# Patient Record
Sex: Male | Born: 1970 | Race: White | Hispanic: No | Marital: Single | Smoking: Current every day smoker
Health system: Southern US, Community
[De-identification: ages and names within clinical notes are randomized; demographics above are authoritative.]

## PROBLEM LIST (undated history)

## (undated) DIAGNOSIS — K219 Gastro-esophageal reflux disease without esophagitis: Secondary | ICD-10-CM

## (undated) DIAGNOSIS — Z972 Presence of dental prosthetic device (complete) (partial): Secondary | ICD-10-CM

## (undated) DIAGNOSIS — I6930 Unspecified sequelae of cerebral infarction: Secondary | ICD-10-CM

## (undated) DIAGNOSIS — E119 Type 2 diabetes mellitus without complications: Secondary | ICD-10-CM

## (undated) DIAGNOSIS — B20 Human immunodeficiency virus [HIV] disease: Secondary | ICD-10-CM

## (undated) DIAGNOSIS — J45909 Unspecified asthma, uncomplicated: Secondary | ICD-10-CM

## (undated) DIAGNOSIS — Z21 Asymptomatic human immunodeficiency virus [HIV] infection status: Secondary | ICD-10-CM

## (undated) DIAGNOSIS — N200 Calculus of kidney: Secondary | ICD-10-CM

## (undated) DIAGNOSIS — G43909 Migraine, unspecified, not intractable, without status migrainosus: Secondary | ICD-10-CM

## (undated) DIAGNOSIS — F319 Bipolar disorder, unspecified: Secondary | ICD-10-CM

## (undated) HISTORY — DX: Unspecified asthma, uncomplicated: J45.909

## (undated) HISTORY — PX: GASTRIC BYPASS: SHX52

## (undated) HISTORY — PX: HERNIA REPAIR: SHX51

## (undated) HISTORY — DX: Unspecified sequelae of cerebral infarction: I69.30

## (undated) HISTORY — DX: Asymptomatic human immunodeficiency virus (hiv) infection status: Z21

## (undated) HISTORY — PX: KIDNEY STONE SURGERY: SHX686

## (undated) HISTORY — DX: Human immunodeficiency virus (HIV) disease: B20

## (undated) HISTORY — DX: Bipolar disorder, unspecified: F31.9

## (undated) HISTORY — PX: COLONOSCOPY: SHX174

## (undated) HISTORY — PX: ELBOW SURGERY: SHX618

## (undated) HISTORY — PX: FACIAL COSMETIC SURGERY: SHX629

## (undated) HISTORY — PX: OTHER SURGICAL HISTORY: SHX169

---

## 2011-09-28 DIAGNOSIS — G43909 Migraine, unspecified, not intractable, without status migrainosus: Secondary | ICD-10-CM | POA: Insufficient documentation

## 2011-12-09 DIAGNOSIS — G4700 Insomnia, unspecified: Secondary | ICD-10-CM | POA: Insufficient documentation

## 2012-08-29 DIAGNOSIS — I10 Essential (primary) hypertension: Secondary | ICD-10-CM | POA: Insufficient documentation

## 2013-05-02 DIAGNOSIS — F39 Unspecified mood [affective] disorder: Secondary | ICD-10-CM | POA: Insufficient documentation

## 2013-06-20 DIAGNOSIS — Z9884 Bariatric surgery status: Secondary | ICD-10-CM | POA: Insufficient documentation

## 2014-09-26 DIAGNOSIS — D3A012 Benign carcinoid tumor of the ileum: Secondary | ICD-10-CM | POA: Insufficient documentation

## 2015-01-13 DIAGNOSIS — Z87898 Personal history of other specified conditions: Secondary | ICD-10-CM | POA: Insufficient documentation

## 2015-01-13 DIAGNOSIS — E663 Overweight: Secondary | ICD-10-CM | POA: Insufficient documentation

## 2015-02-12 DIAGNOSIS — C772 Secondary and unspecified malignant neoplasm of intra-abdominal lymph nodes: Secondary | ICD-10-CM | POA: Insufficient documentation

## 2017-04-20 LAB — LIPID PANEL
Cholesterol: 127 (ref 0–200)
HDL: 34 — AB (ref 35–70)
LDL Cholesterol: 71
Triglycerides: 111 (ref 40–160)

## 2017-04-20 LAB — CBC AND DIFFERENTIAL
HCT: 45 (ref 41–53)
Hemoglobin: 15.4 (ref 13.5–17.5)
Platelets: 196 (ref 150–399)
WBC: 6.4

## 2017-04-20 LAB — BASIC METABOLIC PANEL
BUN: 9 (ref 4–21)
Creatinine: 1.1 (ref 0.6–1.3)
Glucose: 78
Potassium: 4.2 (ref 3.4–5.3)
Sodium: 143 (ref 137–147)

## 2017-04-20 LAB — HEPATIC FUNCTION PANEL
ALT: 12 (ref 10–40)
AST: 23 (ref 14–40)
Alkaline Phosphatase: 63 (ref 25–125)
Bilirubin, Total: 0.7

## 2019-02-08 ENCOUNTER — Telehealth: Payer: Self-pay

## 2019-02-08 NOTE — Telephone Encounter (Signed)
E-mail resent to patient.

## 2019-02-08 NOTE — Telephone Encounter (Signed)
Patient called office stating that she has an appt scheduled on phone for next week and has not received a email. Patient request you send her link again to [email or web address]

## 2019-02-11 ENCOUNTER — Encounter: Payer: Self-pay | Admitting: Family Medicine

## 2019-02-11 ENCOUNTER — Ambulatory Visit (INDEPENDENT_AMBULATORY_CARE_PROVIDER_SITE_OTHER): Payer: Managed Care, Other (non HMO) | Admitting: Family Medicine

## 2019-02-11 DIAGNOSIS — J4520 Mild intermittent asthma, uncomplicated: Secondary | ICD-10-CM

## 2019-02-11 DIAGNOSIS — G4700 Insomnia, unspecified: Secondary | ICD-10-CM | POA: Diagnosis not present

## 2019-02-11 DIAGNOSIS — Z21 Asymptomatic human immunodeficiency virus [HIV] infection status: Secondary | ICD-10-CM

## 2019-02-11 DIAGNOSIS — I6930 Unspecified sequelae of cerebral infarction: Secondary | ICD-10-CM | POA: Insufficient documentation

## 2019-02-11 DIAGNOSIS — F319 Bipolar disorder, unspecified: Secondary | ICD-10-CM

## 2019-02-11 DIAGNOSIS — J309 Allergic rhinitis, unspecified: Secondary | ICD-10-CM

## 2019-02-11 DIAGNOSIS — E785 Hyperlipidemia, unspecified: Secondary | ICD-10-CM | POA: Insufficient documentation

## 2019-02-11 DIAGNOSIS — K5090 Crohn's disease, unspecified, without complications: Secondary | ICD-10-CM | POA: Insufficient documentation

## 2019-02-11 DIAGNOSIS — I10 Essential (primary) hypertension: Secondary | ICD-10-CM

## 2019-02-11 DIAGNOSIS — G43709 Chronic migraine without aura, not intractable, without status migrainosus: Secondary | ICD-10-CM

## 2019-02-11 DIAGNOSIS — R197 Diarrhea, unspecified: Secondary | ICD-10-CM

## 2019-02-11 DIAGNOSIS — N200 Calculus of kidney: Secondary | ICD-10-CM | POA: Insufficient documentation

## 2019-02-11 DIAGNOSIS — J45909 Unspecified asthma, uncomplicated: Secondary | ICD-10-CM | POA: Insufficient documentation

## 2019-02-11 DIAGNOSIS — B20 Human immunodeficiency virus [HIV] disease: Secondary | ICD-10-CM | POA: Insufficient documentation

## 2019-02-11 DIAGNOSIS — Z8719 Personal history of other diseases of the digestive system: Secondary | ICD-10-CM

## 2019-02-11 DIAGNOSIS — Z8673 Personal history of transient ischemic attack (TIA), and cerebral infarction without residual deficits: Secondary | ICD-10-CM | POA: Insufficient documentation

## 2019-02-11 MED ORDER — CETIRIZINE HCL 10 MG PO TABS: 10.0000 mg | ORAL_TABLET | Freq: Every day | ORAL | 11 refills | Status: DC

## 2019-02-11 MED ORDER — FLUTICASONE PROPIONATE 50 MCG/ACT NA SUSP: 2.0000 | Freq: Every day | NASAL | 6 refills | Status: DC

## 2019-02-11 NOTE — Patient Instructions (Signed)

## 2019-02-11 NOTE — Progress Notes (Signed)
Patient: Troy [name], Male    DOB: Jun 17, 1971, 48 y.o.   MRN: [id number] Visit Date: 02/13/2019  Today's Provider: Lavon Paganini, MD   Chief Complaint  Patient presents with  . Establish Care   Subjective:    Virtual Visit via Video Note  I connected with Troy [name] on 02/13/19 at 10:40 AM EDT by a video enabled telemedicine application and verified that I am speaking with the correct person using two identifiers.   I discussed the limitations of evaluation and management by telemedicine and the availability of in person appointments. The patient expressed understanding and agreed to proceed.   Patient location: home Provider location: home office Persons involved in the visit: patient, provider    New Patient:  Troy [name] is a 48 y.o. male who presents today to Establish Care. He was previously seeing a PCP at [facility].  He feels poorly. He reports exercising none. He reports he is sleeping fairly well.  Patient is treated for insomnia and bipolar 1 disorder by Wille Celeste in [place], [place].  Though he has moved from that area, he plans to continue seeing her as he does not have to see her very often and she knows his medical problems.  He is taking Abilify 10 mg daily, BuSpar 30 mg twice daily, doxepin 6 mg daily, gabapentin 300 mg 3 times daily, trazodone 100 mg twice daily, and Ambien CR 6.25 mg nightly as needed.  He is not sure why he is taking gabapentin and denies any chronic pain.  He states his psychiatrist prescribes this for him.  Patient is HIV positive.  He is on an antiretroviral regimen.  It appears through chart review that this is Complera, but patient thinks it may be a different medication at this time.  He is followed by Dr Martyn Ehrich in [place].  He is not sure what his last CD4 count or viral load was.  He reports good compliance with his antiretrovirals.  He denies any AIDS defining illnesses  Asthma: Lifelong problem with no  recent flares.  Reports 2 previous hospitalizations about 4 to 5 years ago for asthma flares.  He takes Singulair daily and uses albuterol as needed.  He reports not having to use this very frequently recently.  Patient has a history of CVA several years ago.  He reports a right-sided facial droop and speech issues as a result of his stroke.  He is not on a statin or aspirin per report and not sure why he does not take these.  He had worsening of migraines after his stroke.  He is taking Topamax for migraine prophylaxis and reports good compliance and denies side effects.  States that he has about 4 to 5 days of migraines that are clustered together about every 2 months.  He uses Zanaflex as needed for neck stiffness that occurs with his migraines.  He takes Imitrex as needed at the first sign of migraine.  States he was never told that this is contraindicated due to his previous CVA.  Patient is concerned about a runny nose that he has had since December.  He states it is clear drainage but no sinus pain or fever.  It is accompanied by postnasal drip and sneezing as well as itchy eyes.  He believes this is due to allergies.  He is taking Singulair, but no antihistamine or nasal spray.  Patient states that he has had worsening bowel habits within the last several months.  He states he will go 4 to 5 days without a bowel movement and then have loose stools with some incontinence for about 2 days.  He does have a history of Crohn's disease, per report, that has been in remission for many years.  He denies any hematochezia, melena, abdominal pain.  He states that he did have a colonoscopy in the past, but is not sure when.   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  Patient is also status post bariatric surgery previously.  He is also had history of multiple kidney stones.  Denies any urinary  complaints currently -----------------------------------------------------------------   Review of Systems  Constitutional: Negative.   HENT: Positive for rhinorrhea, sinus pressure and sneezing.   Eyes: Negative.   Respiratory: Negative.   Cardiovascular: Negative.   Gastrointestinal: Positive for constipation and diarrhea.  Endocrine: Negative.   Genitourinary: Positive for frequency.  Musculoskeletal: Negative.   Skin: Negative.   Allergic/Immunologic: Negative.   Neurological: Negative.   Hematological: Negative.   Psychiatric/Behavioral: Negative.     Social History      He  reports that he quit smoking about 7 years ago. His smoking use included cigarettes. He has a 25.00 pack-year smoking history. He has never used smokeless tobacco. He reports previous alcohol use. He reports previous drug use.       Social History   Socioeconomic History  . Marital status: Single    Spouse name: Not on file  . Number of children: 0  . Years of education: Not on file  . Highest education level: Not on file  Occupational History  . Occupation: Restaurant manager, fast food: [employer]  . Financial resource strain: Not on file  . Food insecurity:    Worry: Not on file    Inability: Not on file  . Transportation needs:    Medical: Not on file    Non-medical: Not on file  Tobacco Use  . Smoking status: Former Smoker    Packs/day: 1.00    Years: 25.00    Pack years: 25.00    Types: Cigarettes    Last attempt to quit: 2013    Years since quitting: 7.3  . Smokeless tobacco: Never Used  Substance and Sexual Activity  . 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 Sexual activity: Not Currently    Partners: Male    Comment: Males  Lifestyle  . Physical activity:    Days per week: Not on file    Minutes per session: Not on file  . Stress: Not on file   Relationships  . Social connections:    Talks on phone: Not on file    Gets together: Not on file    Attends religious service: Not on file    Active member of club or organization: Not on file    Attends meetings of clubs or organizations: Not on file    Relationship status: Not on file  Other Topics Concern  . Not on file  Social History Narrative  . Not on file    Past Medical History:  Diagnosis Date  . Asthma   . Bipolar 1 disorder ([place])   . History of CVA with residual deficit   .  HIV infection [facility])      Patient Active Problem List   Diagnosis Date Noted  . Diarrhea 02/13/2019  . History of Crohn's disease 02/13/2019  . Allergic rhinitis 02/13/2019  . Asthma 02/11/2019  . Dyslipidemia 02/11/2019  . HIV infection ([place]) 02/11/2019  . Kidney stones, calcium oxalate 02/11/2019  . History of CVA with residual deficit   . Bipolar 1 disorder ([place])   . 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 History of morbid obesity 01/13/2015  . 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 S/P bariatric surgery 06/20/2013  . Essential hypertension 08/29/2012  . Insomnia 12/09/2011  . Migraine headache 09/28/2011    Past Surgical History:  Procedure Laterality Date  . COLONOSCOPY    . ELBOW SURGERY Left    fracture  . FACIAL COSMETIC SURGERY    . GASTRIC BYPASS    . HERNIA REPAIR    . KIDNEY STONE SURGERY    . renal artery repair      Family History        Family Status  Relation Name Status  . Mother  Alive  . Father  Alive  . Brother  Deceased  . MGM  Alive  . MGF  Deceased  . PGM  Deceased  . PGF  Deceased        His family history includes COPD in his mother; Cancer in his father and maternal grandmother; Diabetes in his maternal grandfather and mother; Healthy in his father; Heart disease in his brother and paternal grandfather; Hyperlipidemia in his maternal grandfather, maternal grandmother, and mother; Hypertension in his maternal  grandmother, mother, and paternal grandfather; Seizures in his brother; Stroke in his brother; Thyroid disease in his brother.      Allergies  Allergen Reactions  . Niacin Anaphylaxis  . Orange Oil Anaphylaxis  . Topiramate     Affected speech and memory     Current Outpatient Medications:  .  albuterol (VENTOLIN HFA) 108 (90 Base) MCG/ACT inhaler, Inhale into the lungs every 6 (six) hours as needed for wheezing or shortness of breath., Disp: , Rfl:  .  ARIPiprazole (ABILIFY) 10 MG tablet, Take 10 mg by mouth daily., Disp: , Rfl:  .  busPIRone (BUSPAR) 30 MG tablet, Take 30 mg by mouth 2 (two) times daily., Disp: , Rfl:  .  Doxepin HCl (SILENOR) 6 MG TABS, Take 6 mg by mouth daily., Disp: , Rfl:  .  emtricitabine-rilpivir-tenofovir DF (COMPLERA) 200-25-300 MG tablet, Take 1 tablet by mouth daily., Disp: , Rfl:  .  gabapentin (NEURONTIN) 300 MG capsule, Take 300 mg by mouth 3 (three) times daily., Disp: , Rfl:  .  montelukast (SINGULAIR) 10 MG tablet, TAKE 1 TABLET BY MOUTH EVERY DAY, Disp: , Rfl:  .  ondansetron (ZOFRAN-ODT) 4 MG disintegrating tablet, Take by mouth as needed., Disp: , Rfl:  .  tiZANidine (ZANAFLEX) 4 MG capsule, take 1 capsule by mouth twice a day, Disp: , Rfl:  .  topiramate (TOPAMAX) 100 MG tablet, Take by mouth., Disp: , Rfl:  .  traZODone (DESYREL) 100 MG tablet, Take 100 mg by mouth 2 (two) times a day., Disp: , Rfl:  .  zolpidem (AMBIEN CR) 6.25 MG CR tablet, Take 6.25 mg by mouth at bedtime as needed for sleep., Disp: , Rfl:  .  cetirizine (ZYRTEC) 10 MG tablet, Take 1 tablet (10 mg total) by mouth daily., Disp: 30 tablet, Rfl: 11 .  fluticasone (FLONASE) 50 MCG/ACT nasal spray, Place  2 sprays into both nostrils daily., Disp: 16 g, Rfl: 6   Patient Care Team: Virginia Crews, MD as PCP - General (Family Medicine)    Objective:    Vitals: There were no vitals taken for this visit.  There were no vitals filed for this visit.   Physical Exam  Constitutional:      General: He is not in acute distress. Pulmonary:     Effort: Pulmonary effort is normal. No respiratory distress.  Neurological:     Mental Status: He is alert.     Comments: Right-sided facial droop noted  Psychiatric:        Behavior: Behavior normal.      Depression Screen PHQ 2/9 Scores 02/11/2019  PHQ - 2 Score 0       Assessment & Plan:     I discussed the assessment and treatment plan with the patient. The patient was provided an opportunity to ask questions and all were answered. The patient agreed with the plan and demonstrated an understanding of the instructions.   The patient was advised to call back or seek an in-person evaluation if the symptoms worsen or if the condition fails to improve as anticipated.  I provided 45 minutes of non-face-to-face time during this encounter.    Establish Care  Exercise Activities and Dietary recommendations Goals   None     Immunization History  Administered Date(s) Administered  . Influenza-Unspecified 06/28/2018  . Tdap 05/20/2015    Health Maintenance  Topic Date Due  . INFLUENZA VACCINE  05/25/2019  . TETANUS/TDAP  05/19/2025  . HIV Screening  Completed     Discussed health benefits of physical activity, and encouraged him to engage in regular exercise appropriate for his age and condition.    ROI for ID and Psych to be emailed to the patient for him to sign and return to [place] so that we may request records --------------------------------------------------------------------   Problem List Items Addressed This Visit      Cardiovascular and Mediastinum   Essential hypertension    Noted in patient's chart, but he is on no antihypertensives He is a poor historian and unsure of his history of hypertension We discussed the importance of good blood pressure control in the setting of previous CVA Plan to recheck his blood pressure at next in-person visit and discuss possible  antihypertensives at that time      Migraine headache    Can continue topamax for prophylaxis as this seems to minimize headaches Advised that Imitrex and other triptans are contraindicated with h/o CVA He agrees to stop Could consider neurology referral in the future      Relevant Medications   gabapentin (NEURONTIN) 300 MG capsule   topiramate (TOPAMAX) 100 MG tablet   traZODone (DESYREL) 100 MG tablet   tiZANidine (ZANAFLEX) 4 MG capsule     Respiratory   Asthma    Chronic and stable Mild and intermittent Continue albuterol as needed Continue Singulair Advised on return precautions      Relevant Medications   montelukast (SINGULAIR) 10 MG tablet   albuterol (VENTOLIN HFA) 108 (90 Base) MCG/ACT inhaler   Allergic rhinitis    Rhinorrhea and other symptoms that he is concerned about seem consistent with allergic rhinitis Advised to add antihistamine and Flonase to his Singulair daily Advised on allergy maintenance and avoidance of triggers Return precautions discussed        Other   Dyslipidemia    Patient with history of dyslipidemia and CVA  without statin or aspirin Last lipid panel reviewed and [facility] chart with LDL of 75 Discussed importance of lipid control in the setting of history of CVA Plan to recheck lipid panel at next visit and will strongly consider starting a statin      HIV infection ([place])    Followed by infectious disease Continue antiretroviral therapy We will obtain records from ID physician to see what last CD4 count and viral load was      Relevant Medications   emtricitabine-rilpivir-tenofovir DF (COMPLERA) 200-25-300 MG tablet   Insomnia    Followed by psychiatry We will request records Continue current medications He is on several sedating medications, so we will need to watch this in the future      History of CVA with residual deficit - Primary    Reported history of CVA, but not on statin or aspirin Does seem to have some residual  deficits We will plan to check labs at next visit and we will review records as they are available      Bipolar 1 disorder ([place])    Followed by psychiatry No changes in medications We will request records as above      Diarrhea    Patient with change in bowel movements in the setting of reported Crohn's disease in remission for several years We will refer to GI for possible colonoscopy and other management He does seem to have some constipation with possible overflow incontinence Discussed use of MiraLAX and to stop if he has loose stools      Relevant Orders   Ambulatory referral to Gastroenterology   History of Crohn's disease    Referral to GI as above      Relevant Orders   Ambulatory referral to Gastroenterology       Return in about 3 months (around 05/13/2019) for CPE.   The entirety of the information documented in the History of Present Illness, Review of Systems and Physical Exam were personally obtained by me. Portions of this information were initially documented by Tiburcio Pea, CMA and reviewed by me for thoroughness and accuracy.    Virginia Crews, MD, MPH [facility] 02/13/2019 4:07 PM

## 2019-02-13 ENCOUNTER — Encounter: Payer: Self-pay | Admitting: Family Medicine

## 2019-02-13 DIAGNOSIS — Z8719 Personal history of other diseases of the digestive system: Secondary | ICD-10-CM | POA: Insufficient documentation

## 2019-02-13 DIAGNOSIS — R197 Diarrhea, unspecified: Secondary | ICD-10-CM | POA: Insufficient documentation

## 2019-02-13 DIAGNOSIS — J309 Allergic rhinitis, unspecified: Secondary | ICD-10-CM | POA: Insufficient documentation

## 2019-02-13 NOTE — Assessment & Plan Note (Signed)
Patient with change in bowel movements in the setting of reported Crohn's disease in remission for several years We will refer to GI for possible colonoscopy and other management He does seem to have some constipation with possible overflow incontinence Discussed use of MiraLAX and to stop if he has loose stools

## 2019-02-13 NOTE — Assessment & Plan Note (Signed)
Referral to GI as above

## 2019-02-13 NOTE — Assessment & Plan Note (Signed)
Chronic and stable Mild and intermittent Continue albuterol as needed Continue Singulair Advised on return precautions

## 2019-02-13 NOTE — Assessment & Plan Note (Signed)
Can continue topamax for prophylaxis as this seems to minimize headaches Advised that Imitrex and other triptans are contraindicated with h/o CVA He agrees to stop Could consider neurology referral in the future

## 2019-02-13 NOTE — Assessment & Plan Note (Signed)
Followed by psychiatry No changes in medications We will request records as above

## 2019-02-13 NOTE — Assessment & Plan Note (Signed)
Rhinorrhea and other symptoms that he is concerned about seem consistent with allergic rhinitis Advised to add antihistamine and Flonase to his Singulair daily Advised on allergy maintenance and avoidance of triggers Return precautions discussed

## 2019-02-13 NOTE — Assessment & Plan Note (Signed)
Followed by psychiatry We will request records Continue current medications He is on several sedating medications, so we will need to watch this in the future

## 2019-02-13 NOTE — Assessment & Plan Note (Signed)
Followed by infectious disease Continue antiretroviral therapy We will obtain records from ID physician to see what last CD4 count and viral load was

## 2019-02-13 NOTE — Assessment & Plan Note (Signed)
Noted in patient's chart, but he is on no antihypertensives He is a poor historian and unsure of his history of hypertension We discussed the importance of good blood pressure control in the setting of previous CVA Plan to recheck his blood pressure at next in-person visit and discuss possible antihypertensives at that time

## 2019-02-13 NOTE — Assessment & Plan Note (Signed)
Patient with history of dyslipidemia and CVA without statin or aspirin Last lipid panel reviewed and [facility] chart with LDL of 75 Discussed importance of lipid control in the setting of history of CVA Plan to recheck lipid panel at next visit and will strongly consider starting a statin

## 2019-02-13 NOTE — Assessment & Plan Note (Signed)
Reported history of CVA, but not on statin or aspirin Does seem to have some residual deficits We will plan to check labs at next visit and we will review records as they are available

## 2019-02-21 ENCOUNTER — Telehealth: Payer: Self-pay | Admitting: Family Medicine

## 2019-02-21 NOTE — Telephone Encounter (Signed)
Patient advised. He states he will wait and discuss with GI at his upcoming appointment.

## 2019-02-21 NOTE — Telephone Encounter (Signed)
He has an appointment with Gi on 02/26/2019. Don't see they went over this in e-visit. Can do e-visit with Dr. B or bring it up with GI at his upcoming appointment.

## 2019-02-21 NOTE — Telephone Encounter (Signed)
Pt states he "dreamed" he was throwing up and when he woke up he had thrown up.  He said he has done this three time in the last couple of months.   He just had a new Doxy appt on 4/20  CB#  [phone]  Thanks Con Memos

## 2019-02-26 ENCOUNTER — Other Ambulatory Visit: Payer: Self-pay

## 2019-02-26 ENCOUNTER — Ambulatory Visit (INDEPENDENT_AMBULATORY_CARE_PROVIDER_SITE_OTHER): Payer: Managed Care, Other (non HMO) | Admitting: Gastroenterology

## 2019-02-26 DIAGNOSIS — D3A019 Benign carcinoid tumor of the small intestine, unspecified portion: Secondary | ICD-10-CM | POA: Diagnosis not present

## 2019-02-26 DIAGNOSIS — K5090 Crohn's disease, unspecified, without complications: Secondary | ICD-10-CM

## 2019-02-26 DIAGNOSIS — R194 Change in bowel habit: Secondary | ICD-10-CM

## 2019-02-26 NOTE — Progress Notes (Signed)
Sherri Sear, MD [address]  [place]  [place], [place] [address]  Main: [phone]  Fax: [phone]    Gastroenterology Consultation Video Visit  Referring Provider:     Virginia Crews, MD Primary Care Physician:  Virginia Crews, MD Primary Gastroenterologist:  Dr. Cephas Darby Reason for Consultation:     Alternating diarrhea and constipation, history of Crohn's        HPI:   Troy [name] is a 48 y.o. male referred by Dr. Brita Romp, Dionne Bucy, MD  for consultation & management of altered bowel habits  Virtual Visit Video Note  I connected with Troy [name] on 02/26/19 at  9:00 AM EDT by video and verified that I am speaking with the correct person using two identifiers.   I discussed the limitations, risks, security and privacy concerns of performing an evaluation and management service by video and the availability of in person appointments. I also discussed with the patient that there may be a patient responsible charge related to this service. The patient expressed understanding and agreed to proceed.  Location of the Patient: Home  Location of the provider: Home office  Persons participating in the visit: Patient and provider   History of Present Illness: Mr. Stemmer has history of HIV on antiretroviral therapy followed by infectious disease specialist at [facility].  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  He reports that he was diagnosed with Crohn's when he was 48 years old, was on Asacol.  Subsequently, his Crohn's was in remission, he discontinued Asacol several years ago.  He has not been on any any treatment for Crohn's for last few years.  His last colonoscopy from 2015 prior to ileocecal resection did not reveal any evidence of Crohn's in the terminal ileum or colon.  For the last few months, patient  reports that he has been suffering from alternating episodes of  constipation lasting for 4-5 days followed by diarrhea for 3-4 days that he cannot control, non bloody.  He also reports abdominal pain in left lower quadrant, sometimes wakes up with vomiting, food residue, nonbloody. Weight is stable. Currently treated for HIV with complera. He denies alcohol use, tobacco use.  Quit smoking   GI surgeries Partial gastrectomy, gastric sleeve for morbid obesity in 2013 Ileocecal resection secondary to carcinoid of the terminal ileum, 9 mm in size in 01/2015, pathologic stage pT2N1  NSAIDs: None  Antiplts/Anticoagulants/Anti thrombotics: None  GI Procedures:   Partial gastrectomy in 11/2011 for morbid obesity FINAL DIAGNOSIS:       A. STOMACH, PARTIAL GASTRECTOMY:       --PORTION OF STOMACH SHOWING BENIGN FUNDIC-TYPE MUCOSA       WITHOUT DIAGNOSTIC ABNORMALITY.       --REMOVED DURING SURGERY FOR MORBID OBESITY.  EGD and colonoscopy in 08/2014 at [place] for epigastric pain and evaluate for Crohn's FINAL DIAGNOSIS:       A. GASTRIC MUCOSA, ENDOSCOPIC BIOPSIES:       --GASTRIC BODY AND ANTRAL MUCOSA DEMONSTRATING REACTIVE       GASTROPATHY.       --NO EVIDENCE OF CHRONIC ACTIVE INFLAMMATION.       --NO MORPHOLOGIC FEATURES OF H. PYLORI.       --NEGATIVE FOR INTESTINAL METAPLASIA OR MALIGNANCY.        B. TERMINAL ILEUM, ENDOSCOPIC BIOPSIES:       --  SMALL INTESTINAL MUCOSA CONSISTENT WITH ILEAL ORIGIN.       --NEGATIVE FOR INFLAMMATORY OR NEOPLASTIC FEATURES.        C. ILEAL POLYP, ENDOSCOPIC BIOPSY:       --FRAGMENTED SMALL INTESTINAL MUCOSA DEMONSTRATING LOW GRADE       NEUROENDOCRINE NEOPLASM (CARCINOID TUMOR).       --CARCINOID DEMONSTRATES TRABECULAR AND TUBULAR FEATURES.       --NEOPLASM MEASURES APPROXIMATELY 0.5 CM IN GREATEST       DIMENSION ON THIS  SPECIMEN.       --NEOPLASTIC ELEMENTS INCOMPLETELY EXCISED. SEE COMMENT.        D. CECUM, ENDOSCOPIC BIOPSIES:       --BENIGN COLORECTAL MUCOSA DEMONSTRATING NO SIGNIFICANT       PATHOLOGIC ABNORMALITY.        E. ASCENDING COLONIC MUCOSA, ENDOSCOPIC BIOPSIES:       --BENIGN COLORECTAL MUCOSA DEMONSTRATING NO SIGNIFICANT       PATHOLOGIC ABNORMALITY.        F. TRANSVERSE COLONIC MUCOSA, ENDOSCOPIC BIOPSIES:       --BENIGN COLORECTAL MUCOSA DEMONSTRATING NO SIGNIFICANT       PATHOLOGIC ABNORMALITY.        G. DESCENDING COLONIC MUCOSA, ENDOSCOPIC BIOPSIES:       --BENIGN COLORECTAL MUCOSA DEMONSTRATING NO SIGNIFICANT       PATHOLOGIC ABNORMALITY.        H. SIGMOID COLONIC MUCOSA, ENDOSCOPIC BIOPSIES:       --BENIGN COLORECTAL MUCOSA DEMONSTRATING NO SIGNIFICANT       PATHOLOGIC ABNORMALITY.        I. RECTAL MUCOSA, ENDOSCOPIC BIOPSIES:       --COLORECTAL MUCOSA.       --MICROSCOPIC LYMPHOID AGGREGATE FORMATION.       --MILD LAMINA PROPRIA EDEMA.       --NEGATIVE FOR ACTIVE OR GRANULOMATOUS INFLAMMATION.  Past Medical History:  Diagnosis Date  . Asthma   . Bipolar 1 disorder ([place])   . History of CVA with residual deficit   . HIV infection [facility])     Past Surgical History:  Procedure Laterality Date  . COLONOSCOPY    . ELBOW SURGERY Left    fracture  . FACIAL COSMETIC SURGERY    . GASTRIC BYPASS    . HERNIA REPAIR    . KIDNEY STONE SURGERY    . renal artery repair      Current Outpatient Medications:  .  albuterol (VENTOLIN HFA) 108 (90 Base) MCG/ACT inhaler, Inhale into the lungs every 6 (six) hours as needed for wheezing or shortness of breath., Disp: , Rfl:  .  ARIPiprazole (ABILIFY) 10 MG tablet, Take 10 mg by mouth daily., Disp: , Rfl:  .  azithromycin (ZITHROMAX) 250 MG tablet, FPD, Disp: , Rfl:  .  busPIRone (BUSPAR) 30 MG tablet,  Take 30 mg by mouth 2 (two) times daily., Disp: , Rfl:  .  cetirizine (ZYRTEC) 10 MG tablet, Take 1 tablet (10 mg total) by mouth daily., Disp: 30 tablet, Rfl: 11 .  Doxepin HCl (SILENOR) 6 MG TABS, Take 6 mg by mouth daily., Disp: , Rfl:  .  emtricitabine-rilpivir-tenofovir DF (COMPLERA) 200-25-300 MG tablet, Take 1 tablet by mouth daily., Disp: , Rfl:  .  fluticasone (FLONASE) 50 MCG/ACT nasal spray, Place 2 sprays into both nostrils daily., Disp: 16 g, Rfl: 6 .  gabapentin (NEURONTIN) 300 MG capsule, Take 300 mg by mouth 3 (three) times daily., Disp: , Rfl:  .  montelukast (SINGULAIR) 10 MG tablet, TAKE 1  TABLET BY MOUTH EVERY DAY, Disp: , Rfl:  .  ODEFSEY 200-25-25 MG TABS tablet, , Disp: , Rfl:  .  SUMAtriptan (IMITREX) 100 MG tablet, TAKE ONE TABLET BY MOUTH AT ONSET OF THE HEADACHE. MAY REPEAT IN TWO HOURS, Disp: , Rfl:  .  tiZANidine (ZANAFLEX) 4 MG capsule, take 1 capsule by mouth twice a day, Disp: , Rfl:  .  topiramate (TOPAMAX) 100 MG tablet, Take by mouth., Disp: , Rfl:  .  traZODone (DESYREL) 100 MG tablet, Take 100 mg by mouth 2 (two) times a day., Disp: , Rfl:  .  zolpidem (AMBIEN CR) 6.25 MG CR tablet, Take 6.25 mg by mouth at bedtime as needed for sleep., Disp: , Rfl:  .  benzonatate (TESSALON) 100 MG capsule, TAKE ONE CAPSULE (100 MG DOSE) BY MOUTH 3 (THREE) TIMES A DAY AS NEEDED FOR COUGH., Disp: , Rfl:  .  busPIRone (BUSPAR) 15 MG tablet, TK 1 T PO TID, Disp: , Rfl:  .  doxycycline (VIBRA-TABS) 100 MG tablet, , Disp: , Rfl:    Family History  Problem Relation Age of Onset  . COPD Mother   . Diabetes Mother   . Hypertension Mother   . Hyperlipidemia Mother   . Healthy Father   . Cancer Father        skin  . Stroke Brother   . Heart disease Brother   . Seizures Brother   . Thyroid disease Brother   . Cancer Maternal Grandmother        breast  . Hyperlipidemia Maternal Grandmother   . Hypertension Maternal Grandmother   . Diabetes Maternal Grandfather   .  Hyperlipidemia Maternal Grandfather   . Heart disease Paternal Grandfather   . Hypertension Paternal Grandfather      Social History   Tobacco Use  . Smoking status: Former Smoker    Packs/day: 1.00    Years: 25.00    Pack years: 25.00    Types: Cigarettes    Last attempt to quit: 2013    Years since quitting: 7.3  . Smokeless tobacco: Never Used  Substance Use Topics  . 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 Niacin Anaphylaxis 08/10/2011  . Orange oil Anaphylaxis 06/29/2016  . Topiramate  02/17/2011     Imaging Studies: NM Octreoscan whole body W/Spect [facility] 10/09/2014 IMPRESSION: 1. No evidence for metastatic somatostatin receptor positive tumor. 2. Only a small focus of mild increased uptake noted on the 4 hour planar image in the right lower quadrant, indeterminate. This could correlate with small residual primary somatostatin receptor positive tumor in the terminal ileum, or could simply be  physiologic.  Assessment and Plan:   Raheim Beutler is a 48 y.o. Caucasian male with HIV on Complera, carcinoid of the terminal ileum, T2N1, surgical resection with negative margins, primary ileocolonic anastomosis,?  History of Crohn's disease with no objective evidence presents with several months history of alternating episodes of constipation and diarrhea without any other constitutional symptoms  I recommend upper endoscopy and colonoscopy with biopsies for altered bowel habits Recommend CT enterography given his history of small bowel carcinoid.  He had Octreoscan prior to surgery with no evidence of metastatic disease Asked him to fax copy of lab results to my office  that he is getting by his infectious disease specialist   Follow Up Instructions:   I discussed the  assessment and treatment plan with the patient. The patient was provided an opportunity to ask questions and all were answered. The patient agreed with the plan and demonstrated an understanding of the instructions.   The patient was advised to call back or seek an in-person evaluation if the symptoms worsen or if the condition fails to improve as anticipated.  I provided 25 minutes of face-to-face time during this encounter.   Follow up in 6 weeks   Cephas Darby, MD

## 2019-02-27 ENCOUNTER — Other Ambulatory Visit: Payer: Self-pay

## 2019-02-27 DIAGNOSIS — D3A019 Benign carcinoid tumor of the small intestine, unspecified portion: Secondary | ICD-10-CM

## 2019-02-27 DIAGNOSIS — K5090 Crohn's disease, unspecified, without complications: Secondary | ICD-10-CM

## 2019-02-27 DIAGNOSIS — R194 Change in bowel habit: Secondary | ICD-10-CM

## 2019-03-04 ENCOUNTER — Telehealth: Payer: Self-pay | Admitting: Gastroenterology

## 2019-03-04 NOTE — Telephone Encounter (Signed)
Tillie Rung from [facility] left vm pt has CT scan 03/06/19 and they need pre authorization on file please call [phone] ext [phone]

## 2019-03-04 NOTE — Telephone Encounter (Signed)
Prior auth?

## 2019-03-04 NOTE — Telephone Encounter (Signed)
Prior authorization for CT scan has been submitted to [employer]. Waiting on approval/denial.

## 2019-03-05 ENCOUNTER — Telehealth: Payer: Self-pay | Admitting: Gastroenterology

## 2019-03-05 NOTE — Telephone Encounter (Signed)
[employer] faxed an approval for the following test 7417 computed tomography(CT)with contrast. BMZ#[id number],TXLE # [id number] efft dates 03-04-2019 through 08-31-2019. The fax was sent to [facility] in [place] copy made for [place].

## 2019-03-06 ENCOUNTER — Ambulatory Visit
Admission: RE | Admit: 2019-03-06 | Discharge: 2019-03-06 | Disposition: A | Discharge status: Home / Self Care | Payer: Managed Care, Other (non HMO) | Source: Ambulatory Visit | Attending: Gastroenterology | Admitting: Gastroenterology

## 2019-03-06 ENCOUNTER — Other Ambulatory Visit: Payer: Self-pay | Admitting: Gastroenterology

## 2019-03-06 ENCOUNTER — Other Ambulatory Visit: Payer: Self-pay

## 2019-03-06 DIAGNOSIS — D3A019 Benign carcinoid tumor of the small intestine, unspecified portion: Secondary | ICD-10-CM

## 2019-03-06 DIAGNOSIS — R194 Change in bowel habit: Secondary | ICD-10-CM | POA: Insufficient documentation

## 2019-03-06 DIAGNOSIS — K5090 Crohn's disease, unspecified, without complications: Secondary | ICD-10-CM | POA: Diagnosis present

## 2019-03-06 HISTORY — DX: Type 2 diabetes mellitus without complications: E11.9

## 2019-03-06 LAB — POCT I-STAT CREATININE: Creatinine, Ser: 1.2 mg/dL (ref 0.61–1.24)

## 2019-03-06 MED ORDER — IOHEXOL 300 MG/ML  SOLN
100.0000 mL | Freq: Once | INTRAMUSCULAR | Status: AC | PRN
  Administered 2019-03-06: 100 mL via INTRAVENOUS

## 2019-03-11 ENCOUNTER — Other Ambulatory Visit: Payer: Self-pay

## 2019-03-11 DIAGNOSIS — Z01812 Encounter for preprocedural laboratory examination: Secondary | ICD-10-CM

## 2019-03-12 NOTE — Telephone Encounter (Signed)
Pt left vm he states he  Received a call From [facility] to Isolate until his Procedure ???

## 2019-03-12 NOTE — Telephone Encounter (Signed)
Pt is calling again about prev. Message he states the Hospital told him that he would have to quarantine after having his test for covid19 until his procedure he states he has a full time job and can not quarantine please call pt

## 2019-03-15 ENCOUNTER — Other Ambulatory Visit: Admission: RE | Admit: 2019-03-15 | Payer: Managed Care, Other (non HMO) | Source: Ambulatory Visit

## 2019-03-21 ENCOUNTER — Other Ambulatory Visit: Payer: Self-pay

## 2019-03-21 ENCOUNTER — Ambulatory Visit: Admit: 2019-03-21 | Payer: Managed Care, Other (non HMO) | Admitting: Gastroenterology

## 2019-03-21 DIAGNOSIS — R194 Change in bowel habit: Secondary | ICD-10-CM

## 2019-03-21 DIAGNOSIS — Z01812 Encounter for preprocedural laboratory examination: Secondary | ICD-10-CM

## 2019-03-21 DIAGNOSIS — K5090 Crohn's disease, unspecified, without complications: Secondary | ICD-10-CM

## 2019-03-21 SURGERY — ESOPHAGOGASTRODUODENOSCOPY (EGD) WITH PROPOFOL
Anesthesia: General

## 2019-03-26 NOTE — Telephone Encounter (Signed)
Rapid test has been ordered and procedure has been rescheduled, pt has been notified and verbalized understanding

## 2019-04-10 ENCOUNTER — Telehealth: Payer: Self-pay

## 2019-04-10 NOTE — Telephone Encounter (Signed)
Contacted patient to reschedule EGD/Colonoscopy scheduled for 04/25/19 due to Dr.Vanga not being avvailable.  Patients wife allowed me to reschedule, however she expressed frustration stating this is the third reschedule.  I apologized for the inconvenience and rescheduled to July 20th.  Offered earlier date in July but they had plans.  Thanks [employer]

## 2019-04-15 ENCOUNTER — Ambulatory Visit: Payer: Managed Care, Other (non HMO) | Admitting: Gastroenterology

## 2019-04-24 ENCOUNTER — Telehealth (INDEPENDENT_AMBULATORY_CARE_PROVIDER_SITE_OTHER): Payer: Managed Care, Other (non HMO) | Admitting: Family Medicine

## 2019-04-24 DIAGNOSIS — H6012 Cellulitis of left external ear: Secondary | ICD-10-CM

## 2019-04-24 DIAGNOSIS — R300 Dysuria: Secondary | ICD-10-CM

## 2019-04-24 DIAGNOSIS — R3915 Urgency of urination: Secondary | ICD-10-CM | POA: Diagnosis not present

## 2019-04-24 LAB — POCT URINALYSIS DIPSTICK
Blood, UA: NEGATIVE
Glucose, UA: NEGATIVE
Leukocytes, UA: NEGATIVE
Nitrite, UA: NEGATIVE
Protein, UA: NEGATIVE
Spec Grav, UA: 1.025 (ref 1.010–1.025)
Urobilinogen, UA: 0.2 E.U./dL
pH, UA: 6 (ref 5.0–8.0)

## 2019-04-24 MED ORDER — DOXYCYCLINE HYCLATE 100 MG PO TABS: 100.0000 mg | ORAL_TABLET | Freq: Two times a day (BID) | ORAL | 0 refills | Status: AC

## 2019-04-24 NOTE — Progress Notes (Signed)
Patient: Troy [name] Male    DOB: 1971/01/11   48 y.o.   MRN: [id number] Visit Date: 04/25/2019  Today's Provider: Lavon Paganini, MD   Chief Complaint  Patient presents with  . Urinary Incontinence  . Insect Bite   Subjective:    I, Porsha McClurkin CMA, am acting as a scribe for Lavon Paganini, MD.   Virtual Visit via Video Note  I connected with Troy [name] on 04/25/19 at  1:40 PM EDT by a video enabled telemedicine application and verified that I am speaking with the correct person using two identifiers.   Patient location: home Provider location: [place] involved in the visit: patient, provider   I discussed the limitations of evaluation and management by telemedicine and the availability of in person appointments. The patient expressed understanding and agreed to proceed.  Interactive audio and video communications were attempted, although failed due to patient's inability to connect to video. Continued visit with audio only interaction with patient agreement. Then patient came in for in person visit for exam after history was obtained over the phone.  HPI  Urinary Incontinence  Patient presents today for urinary incontinence x3-4 times when he has been unable to make it to the bathroom on time in the last week or so. Patient states that he always has an urgency to urinate for the past 3 months. He also reports dysuria and frequency.  He has been avoiding liquids to avoiding peeing so much.  No hesitancy. + dribbling. Nocturia x1-2.   Insect Bite Patient states that he removed a tick from his left ear last week but does not remember the day. Patient reports that his left ear hurts and has been bleeding. Blood blister present currently.  Red anf TTP. Around ear canal.  Allergies  Allergen Reactions  . Niacin Anaphylaxis  . Orange Oil Anaphylaxis  . Topiramate     Affected speech and memory     Current Outpatient Medications:   .  albuterol (VENTOLIN HFA) 108 (90 Base) MCG/ACT inhaler, Inhale into the lungs every 6 (six) hours as needed for wheezing or shortness of breath., Disp: , Rfl:  .  ARIPiprazole (ABILIFY) 10 MG tablet, Take 10 mg by mouth daily., Disp: , Rfl:  .  azithromycin (ZITHROMAX) 250 MG tablet, FPD, Disp: , Rfl:  .  benzonatate (TESSALON) 100 MG capsule, TAKE ONE CAPSULE (100 MG DOSE) BY MOUTH 3 (THREE) TIMES A DAY AS NEEDED FOR COUGH., Disp: , Rfl:  .  busPIRone (BUSPAR) 15 MG tablet, TK 1 T PO TID, Disp: , Rfl:  .  busPIRone (BUSPAR) 30 MG tablet, Take 30 mg by mouth 2 (two) times daily., Disp: , Rfl:  .  cetirizine (ZYRTEC) 10 MG tablet, Take 1 tablet (10 mg total) by mouth daily., Disp: 30 tablet, Rfl: 11 .  Doxepin HCl (SILENOR) 6 MG TABS, Take 6 mg by mouth daily., Disp: , Rfl:  .  doxycycline (VIBRA-TABS) 100 MG tablet, Take 1 tablet (100 mg total) by mouth 2 (two) times daily for 7 days., Disp: 14 tablet, Rfl: 0 .  emtricitabine-rilpivir-tenofovir DF (COMPLERA) 200-25-300 MG tablet, Take 1 tablet by mouth daily., Disp: , Rfl:  .  fluticasone (FLONASE) 50 MCG/ACT nasal spray, Place 2 sprays into both nostrils daily., Disp: 16 g, Rfl: 6 .  gabapentin (NEURONTIN) 300 MG capsule, Take 300 mg by mouth 3 (three) times daily., Disp: , Rfl:  .  montelukast (SINGULAIR) 10 MG tablet, TAKE 1  TABLET BY MOUTH EVERY DAY, Disp: , Rfl:  .  ODEFSEY 200-25-25 MG TABS tablet, , Disp: , Rfl:  .  SUMAtriptan (IMITREX) 100 MG tablet, TAKE ONE TABLET BY MOUTH AT ONSET OF THE HEADACHE. MAY REPEAT IN TWO HOURS, Disp: , Rfl:  .  tiZANidine (ZANAFLEX) 4 MG capsule, take 1 capsule by mouth twice a day, Disp: , Rfl:  .  topiramate (TOPAMAX) 100 MG tablet, Take by mouth., Disp: , Rfl:  .  traZODone (DESYREL) 100 MG tablet, Take 100 mg by mouth 2 (two) times a day., Disp: , Rfl:  .  zolpidem (AMBIEN CR) 6.25 MG CR tablet, Take 6.25 mg by mouth at bedtime as needed for sleep., Disp: , Rfl:   Review of Systems  Constitutional:  Negative.   HENT: Positive for ear discharge (Bleeding per pt) and ear pain.   Respiratory: Negative.   Genitourinary: Positive for frequency and urgency.    Social History   Tobacco Use  . Smoking status: Former Smoker    Packs/day: 1.00    Years: 25.00    Pack years: 25.00    Types: Cigarettes    Quit date: 2013    Years since quitting: 7.5  . Smokeless tobacco: Never Used  Substance Use Topics  . 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 There were no vitals filed for this visit.   Physical Exam Constitutional:      Appearance: Normal appearance.  HENT:     Head: Normocephalic and atraumatic.     Left Ear: Tympanic membrane and ear canal normal.     Ears:     Comments: Erythema at edge of ear canal and wound with small amount of pus  Cardiovascular:     Rate and Rhythm: Normal rate and regular rhythm.  Pulmonary:     Effort: Pulmonary effort is normal. No respiratory distress.  Abdominal:     General: There is no distension.     Palpations: Abdomen is soft.     Tenderness: There is no abdominal tenderness.  Neurological:     Mental Status: He is alert and oriented to person, place, and time. Mental status is at baseline.  Psychiatric:        Mood and Affect: Mood normal.        Behavior: Behavior normal.      Results for orders placed or performed in visit on 04/24/19  POCT urinalysis dipstick  Result Value Ref Range   Color, UA Golden Yellow    Clarity, UA Clear    Glucose, UA Negative Negative   Bilirubin, UA Small +    Ketones, UA Moderate (40)    Spec Grav, UA 1.025 1.010 - 1.025   Blood, UA Negative    pH, UA 6.0 5.0 - 8.0   Protein, UA Negative Negative   Urobilinogen, UA 0.2 0.2 or 1.0 E.U./dL   Nitrite, UA Negative    Leukocytes, UA Negative Negative   Appearance     Odor         Assessment & Plan    I discussed the assessment and treatment plan  with the patient. The patient was provided an opportunity to ask questions and all were answered. The patient agreed with the plan and demonstrated an understanding of the instructions.   The patient was advised to call back or seek an in-person evaluation if  the symptoms worsen or if the condition fails to improve as anticipated.  1. Urinary urgency 2. Dysuria -Subacute and worsening - Urinalysis not concerning for UTI and not enough available to send for culture - Exam is benign non-concerning - Consider possible BPH Referral to urology for further evaluation and management - POCT urinalysis dipstick - Ambulatory referral to Urology  3. Cellulitis of left ear -New problem after tick bite - Tick seems to be removed entirely with no signs of tickborne illness -Patient does have some signs of cellulitis around the site of the bite, however -Given his immunocompromise state, with HIV, we will go ahead and treat with oral antibiotics -Treat with 7-day course of doxycycline as this would cover any tickborne illness as well -Discussed strict return precautions    Meds ordered this encounter  Medications  . doxycycline (VIBRA-TABS) 100 MG tablet    Sig: Take 1 tablet (100 mg total) by mouth 2 (two) times daily for 7 days.    Dispense:  14 tablet    Refill:  0     Return in about 4 weeks (around 05/22/2019) for CPE as scheduled.   The entirety of the information documented in the History of Present Illness, Review of Systems and Physical Exam were personally obtained by me. Portions of this information were initially documented by [facility] McClurkin and Tiburcio Pea, [place] and reviewed by me for thoroughness and accuracy.    , Dionne Bucy, MD MPH [facility]

## 2019-04-25 ENCOUNTER — Telehealth: Payer: Managed Care, Other (non HMO) | Admitting: Family Medicine

## 2019-04-25 ENCOUNTER — Telehealth: Payer: Self-pay

## 2019-04-25 NOTE — Telephone Encounter (Signed)
LVMTRC 

## 2019-04-25 NOTE — Telephone Encounter (Signed)
-----   Message from Virginia Crews, MD sent at 04/25/2019  9:56 AM EDT ----- No signs of UTI.  Did it get sent for culture?  Will place referral to urology.

## 2019-05-02 NOTE — Telephone Encounter (Signed)
Advised patient as below. A urine culture was not done. Patient reports that he already has appt scheduled with the urologist.

## 2019-05-09 ENCOUNTER — Other Ambulatory Visit: Payer: Self-pay

## 2019-05-09 ENCOUNTER — Telehealth: Payer: Self-pay | Admitting: Gastroenterology

## 2019-05-09 ENCOUNTER — Other Ambulatory Visit: Admission: RE | Admit: 2019-05-09 | Payer: Managed Care, Other (non HMO) | Source: Ambulatory Visit

## 2019-05-09 DIAGNOSIS — K5090 Crohn's disease, unspecified, without complications: Secondary | ICD-10-CM

## 2019-05-09 DIAGNOSIS — R194 Change in bowel habit: Secondary | ICD-10-CM

## 2019-05-09 NOTE — Telephone Encounter (Signed)
Procedure has been rescheduled to 05/30/2019 @ [place], pt has been notified and verbalized understanding

## 2019-05-09 NOTE — Telephone Encounter (Signed)
Pt is calling he would like to reschedule his procedure for 05/13/19 for a  Thursday but not the 05/16/19 after this date on a Thursday  Please call pt

## 2019-05-13 ENCOUNTER — Encounter: Admission: RE | Payer: Self-pay | Source: Home / Self Care

## 2019-05-13 ENCOUNTER — Ambulatory Visit
Admission: RE | Admit: 2019-05-13 | Payer: Managed Care, Other (non HMO) | Source: Home / Self Care | Admitting: Gastroenterology

## 2019-05-13 SURGERY — ESOPHAGOGASTRODUODENOSCOPY (EGD) WITH PROPOFOL
Anesthesia: General

## 2019-05-24 ENCOUNTER — Other Ambulatory Visit: Payer: Self-pay

## 2019-05-24 ENCOUNTER — Encounter: Payer: Self-pay | Admitting: Family Medicine

## 2019-05-24 ENCOUNTER — Ambulatory Visit (INDEPENDENT_AMBULATORY_CARE_PROVIDER_SITE_OTHER): Payer: Managed Care, Other (non HMO) | Admitting: Family Medicine

## 2019-05-24 VITALS — BP 129/80 | HR 109 | Temp 97.4°F | Ht 69.0 in | Wt 164.4 lb

## 2019-05-24 DIAGNOSIS — G4700 Insomnia, unspecified: Secondary | ICD-10-CM | POA: Diagnosis not present

## 2019-05-24 DIAGNOSIS — Z79899 Other long term (current) drug therapy: Secondary | ICD-10-CM | POA: Diagnosis not present

## 2019-05-24 DIAGNOSIS — Z0000 Encounter for general adult medical examination without abnormal findings: Secondary | ICD-10-CM | POA: Diagnosis not present

## 2019-05-24 DIAGNOSIS — E785 Hyperlipidemia, unspecified: Secondary | ICD-10-CM | POA: Diagnosis not present

## 2019-05-24 DIAGNOSIS — G43709 Chronic migraine without aura, not intractable, without status migrainosus: Secondary | ICD-10-CM | POA: Diagnosis not present

## 2019-05-24 DIAGNOSIS — Z21 Asymptomatic human immunodeficiency virus [HIV] infection status: Secondary | ICD-10-CM

## 2019-05-24 DIAGNOSIS — I10 Essential (primary) hypertension: Secondary | ICD-10-CM | POA: Diagnosis not present

## 2019-05-24 MED ORDER — TIZANIDINE HCL 4 MG PO CAPS: 4.0000 mg | ORAL_CAPSULE | Freq: Two times a day (BID) | ORAL | 1 refills | Status: DC

## 2019-05-24 MED ORDER — SUMATRIPTAN SUCCINATE 100 MG PO TABS: ORAL_TABLET | ORAL | 3 refills | Status: DC

## 2019-05-24 MED ORDER — CETIRIZINE HCL 10 MG PO TABS: 10.0000 mg | ORAL_TABLET | Freq: Every day | ORAL | 3 refills | Status: DC

## 2019-05-24 MED ORDER — ALBUTEROL SULFATE HFA 108 (90 BASE) MCG/ACT IN AERS: 1.0000 | INHALATION_SPRAY | Freq: Four times a day (QID) | RESPIRATORY_TRACT | 3 refills | Status: DC | PRN

## 2019-05-24 MED ORDER — TOPIRAMATE 100 MG PO TABS: 100.0000 mg | ORAL_TABLET | Freq: Two times a day (BID) | ORAL | 3 refills | Status: DC

## 2019-05-24 MED ORDER — MONTELUKAST SODIUM 10 MG PO TABS: 10.0000 mg | ORAL_TABLET | Freq: Every day | ORAL | 3 refills | Status: DC

## 2019-05-24 NOTE — Patient Instructions (Signed)
Preventive Care 46-46 Years Old, Male Preventive care refers to lifestyle choices and visits with your health care provider that can promote health and wellness. This includes:  A yearly physical exam. This is also called an annual well check.  Regular dental and eye exams.  Immunizations.  Screening for certain conditions.  Healthy lifestyle choices, such as eating a healthy diet, getting regular exercise, not using drugs or products that contain nicotine and tobacco, and limiting alcohol use. What can I expect for my preventive care visit? Physical exam Your health care provider will check:  Height and weight. These may be used to calculate body mass index (BMI), which is a measurement that tells if you are at a healthy weight.  Heart rate and blood pressure.  Your skin for abnormal spots. Counseling Your health care provider may ask you questions about:  Alcohol, tobacco, and drug use.  Emotional well-being.  Home and relationship well-being.  Sexual activity.  Eating habits.  Work and work environment. What immunizations do I need?  Influenza (flu) vaccine  This is recommended every year. Tetanus, diphtheria, and pertussis (Tdap) vaccine  You may need a Td booster every 10 years. Varicella (chickenpox) vaccine  You may need this vaccine if you have not already been vaccinated. Zoster (shingles) vaccine  You may need this after age 46. Measles, mumps, and rubella (MMR) vaccine  You may need at least one dose of MMR if you were born in 1957 or later. You may also need a second dose. Pneumococcal conjugate (PCV13) vaccine  You may need this if you have certain conditions and were not previously vaccinated. Pneumococcal polysaccharide (PPSV23) vaccine  You may need one or two doses if you smoke cigarettes or if you have certain conditions. Meningococcal conjugate (MenACWY) vaccine  You may need this if you have certain conditions. Hepatitis A vaccine   You may need this if you have certain conditions or if you travel or work in places where you may be exposed to hepatitis A. Hepatitis B vaccine  You may need this if you have certain conditions or if you travel or work in places where you may be exposed to hepatitis B. Haemophilus influenzae type b (Hib) vaccine  You may need this if you have certain risk factors. Human papillomavirus (HPV) vaccine  If recommended by your health care provider, you may need three doses over 6 months. You may receive vaccines as individual doses or as more than one vaccine together in one shot (combination vaccines). Talk with your health care provider about the risks and benefits of combination vaccines. What tests do I need? Blood tests  Lipid and cholesterol levels. These may be checked every 5 years, or more frequently if you are over 46 years old.  Hepatitis C test.  Hepatitis B test. Screening  Lung cancer screening. You may have this screening every year starting at age 46 if you have a 30-pack-year history of smoking and currently smoke or have quit within the past 15 years.  Prostate cancer screening. Recommendations will vary depending on your family history and other risks.  Colorectal cancer screening. All adults should have this screening starting at age 46 and continuing until age 75. Your health care provider may recommend screening at age 45 if you are at increased risk. You will have tests every 1-10 years, depending on your results and the type of screening test.  Diabetes screening. This is done by checking your blood sugar (glucose) after you have not eaten   for a while (fasting). You may have this done every 1-3 years.  Sexually transmitted disease (STD) testing. Follow these instructions at home: Eating and drinking  Eat a diet that includes fresh fruits and vegetables, whole grains, lean protein, and low-fat dairy products.  Take vitamin and mineral supplements as recommended  by your health care provider.  Do not drink alcohol if your health care provider tells you not to drink.  If you drink alcohol: ? Limit how much you have to 0-2 drinks a day. ? Be aware of how much alcohol is in your drink. In the [place] one drink equals one 12 oz bottle of beer (355 mL), one 5 oz glass of wine (148 mL), or one 1 oz glass of hard liquor (44 mL). Lifestyle  Take daily care of your teeth and gums.  Stay active. Exercise for at least 30 minutes on 5 or more days each week.  Do not use any products that contain nicotine or tobacco, such as cigarettes, e-cigarettes, and chewing tobacco. If you need help quitting, ask your health care provider.  If you are sexually active, practice safe sex. Use a condom or other form of protection to prevent STIs (sexually transmitted infections).  Talk with your health care provider about taking a low-dose aspirin every day starting at age 46. What's next?  Go to your health care provider once a year for a well check visit.  Ask your health care provider how often you should have your eyes and teeth checked.  Stay up to date on all vaccines. This information is not intended to replace advice given to you by your health care provider. Make sure you discuss any questions you have with your health care provider. Document Released: 11/06/2015 Document Revised: 10/04/2018 Document Reviewed: 10/04/2018 Elsevier Patient Education  2020 [employer].

## 2019-05-24 NOTE — Progress Notes (Signed)
Patient: Troy [name], Male    DOB: 1970/11/17, 48 y.o.   MRN: [id number] Visit Date: 05/24/2019  Today's Provider: Lavon Paganini, MD   Chief Complaint  Patient presents with  . Annual Exam   Subjective:  I, Tiburcio Pea, CMA, am acting as a scribe for Lavon Paganini, MD.    Annual physical exam Troy [name] is a 48 y.o. male who presents today for health maintenance and complete physical. He feels well. He reports no regular exercising, but works 50 hours per week. He reports he is sleeping well.  Psychiatrist - Benancio Deeds is treating sleeping  Takes Ambien CR at bedtime Goes to bed and falls asleep at 9pm Wakes at 1:30-2am - has to urinate Awake for 2.5-3 hrs, eats breakfast Dozes off and vomits during sleep for 86mn - 1hr  Nocturia x1-2  Straining with end of stream of urination -----------------------------------------------------------------   Review of Systems  Constitutional: Negative.   HENT: Negative.   Eyes: Negative.   Respiratory: Negative.   Cardiovascular: Negative.   Gastrointestinal: Negative.   Endocrine: Negative.   Genitourinary: Negative.   Musculoskeletal: Negative.   Skin: Negative.   Allergic/Immunologic: Negative.   Neurological: Negative.   Hematological: Negative.   Psychiatric/Behavioral: Negative.     Social History He  reports that he quit smoking about 7 years ago. His smoking use included cigarettes. He has a 25.00 pack-year smoking history. He has never used smokeless tobacco. He reports previous alcohol use. He reports previous drug use. Social History   Socioeconomic History  . Marital status: Single    Spouse name: Not on file  . Number of children: 0  . Years of education: Not on file  . Highest education level: Not on file  Occupational History  . Occupation: aRestaurant manager, fast food [employer] . Financial resource strain: Not on file  . Food insecurity    Worry: Not on file   Inability: Not on file  . Transportation needs    Medical: Not on file    Non-medical: Not on file  Tobacco Use  . Smoking status: Former Smoker    Packs/day: 1.00    Years: 25.00    Pack years: 25.00    Types: Cigarettes    Quit date: 2013    Years since quitting: 7.5  . Smokeless tobacco: Never Used  Substance and Sexual Activity  . 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 Sexual activity: Not Currently    Partners: Male    Comment: Males  Lifestyle  . Physical activity    Days per week: Not on file    Minutes per session: Not on file  . Stress: Not on file  Relationships  . Social [place]: Not on file    Gets together: Not on file    Attends religious service: Not on file    Active member of club or organization: Not on file    Attends meetings of clubs or organizations: Not on file    Relationship status: Not on file  Other Topics Concern  . Not on file  Social History Narrative  . Not on file    Patient Active Problem List   Diagnosis Date Noted  . Diarrhea 02/13/2019  . History of Crohn's disease 02/13/2019  .  Allergic rhinitis 02/13/2019  . Asthma 02/11/2019  . Dyslipidemia 02/11/2019  . HIV infection ([place]) 02/11/2019  . Kidney stones, calcium oxalate 02/11/2019  . History of CVA with residual deficit   . Bipolar 1 disorder ([place])   . 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 History of morbid obesity 01/13/2015  . 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 S/P bariatric surgery 06/20/2013  . Essential hypertension 08/29/2012  . Insomnia 12/09/2011  . Migraine headache 09/28/2011    Past Surgical History:  Procedure Laterality Date  . COLONOSCOPY    . ELBOW SURGERY Left    fracture  . FACIAL COSMETIC SURGERY    . GASTRIC BYPASS    . HERNIA REPAIR    . KIDNEY STONE SURGERY    . renal artery  repair      Family History  Family Status  Relation Name Status  . Mother  Alive  . Father  Alive  . Brother  Deceased  . MGM  Alive  . MGF  Deceased  . PGM  Deceased  . PGF  Deceased   His family history includes COPD in his mother; Cancer in his father and maternal grandmother; Diabetes in his maternal grandfather and mother; Healthy in his father; Heart disease in his brother and paternal grandfather; Hyperlipidemia in his maternal grandfather, maternal grandmother, and mother; Hypertension in his maternal grandmother, mother, and paternal grandfather; Seizures in his brother; Stroke in his brother; Thyroid disease in his brother.     Allergies  Allergen Reactions  . Niacin Anaphylaxis  . Orange Oil Anaphylaxis  . Topiramate     Affected speech and memory    Previous Medications   ARIPIPRAZOLE (ABILIFY) 10 MG TABLET    Take 10 mg by mouth daily.   BUSPIRONE (BUSPAR) 15 MG TABLET    TK 1 T PO TID   BUSPIRONE (BUSPAR) 30 MG TABLET    Take 30 mg by mouth 2 (two) times daily.   DOXEPIN HCL (SILENOR) 6 MG TABS    Take 6 mg by mouth daily.   EMTRICITABINE-RILPIVIR-TENOFOVIR DF (COMPLERA) 200-25-300 MG TABLET    Take 1 tablet by mouth daily.   FLUTICASONE (FLONASE) 50 MCG/ACT NASAL SPRAY    Place 2 sprays into both nostrils daily.   GABAPENTIN (NEURONTIN) 300 MG CAPSULE    Take 300 mg by mouth 3 (three) times daily.   ODEFSEY 200-25-25 MG TABS TABLET       TRAZODONE (DESYREL) 100 MG TABLET    Take 100 mg by mouth 2 (two) times a day.   ZOLPIDEM (AMBIEN CR) 6.25 MG CR TABLET    Take 6.25 mg by mouth at bedtime as needed for sleep.    Patient Care Team: Virginia Crews, MD as PCP - General (Family Medicine)      Objective:   Vitals: BP 129/80 (BP Location: Right Arm, Patient Position: Sitting, Cuff Size: Normal)   Pulse (!) 109   Temp (!) 97.4 F (36.3 C) (Oral)   Ht 5' 9"  (1.753 m)   Wt 164 lb 6.4 oz (74.6 kg)   SpO2 99%   BMI 24.28 kg/m    Physical Exam Vitals  signs reviewed.  Constitutional:      General: He is not in acute distress.    Appearance: Normal appearance. He is well-developed. He is not diaphoretic.  HENT:     Head: Normocephalic and atraumatic.     Right Ear: Tympanic membrane, ear canal and external ear normal.  Left Ear: Tympanic membrane, ear canal and external ear normal.  Eyes:     General: No scleral icterus.    Conjunctiva/sclera: Conjunctivae normal.     Pupils: Pupils are equal, round, and reactive to light.  Neck:     Musculoskeletal: Neck supple.     Thyroid: No thyromegaly.  Cardiovascular:     Rate and Rhythm: Normal rate and regular rhythm.     Pulses: Normal pulses.     Heart sounds: Normal heart sounds. No murmur.  Pulmonary:     Effort: Pulmonary effort is normal. No respiratory distress.     Breath sounds: Normal breath sounds. No wheezing or rales.  Abdominal:     General: Bowel sounds are normal. There is no distension.     Palpations: Abdomen is soft.     Tenderness: There is no abdominal tenderness. There is no guarding or rebound.  Musculoskeletal:        General: No deformity.     Right lower leg: No edema.     Left lower leg: No edema.  Lymphadenopathy:     Cervical: No cervical adenopathy.  Skin:    General: Skin is warm and dry.     Capillary Refill: Capillary refill takes less than 2 seconds.     Findings: No rash.  Neurological:     Mental Status: He is alert and oriented to person, place, and time. Mental status is at baseline.  Psychiatric:        Mood and Affect: Mood is depressed. Affect is flat.        Behavior: Behavior normal.        Thought Content: Thought content normal.      Depression Screen PHQ 2/9 Scores 05/24/2019 02/11/2019  PHQ - 2 Score 2 0  PHQ- 9 Score 11 -      Assessment & Plan:     Routine Health Maintenance and Physical Exam  Exercise Activities and Dietary recommendations Goals   None     Immunization History  Administered Date(s)  Administered  . Influenza-Unspecified 06/28/2018  . Tdap 05/20/2015    Health Maintenance  Topic Date Due  . INFLUENZA VACCINE  05/25/2019  . TETANUS/TDAP  05/19/2025  . HIV Screening  Completed     Discussed health benefits of physical activity, and encouraged him to engage in regular exercise appropriate for his age and condition.    --------------------------------------------------------------------  Problem List Items Addressed This Visit      Cardiovascular and Mediastinum   Essential hypertension    Seems to be diet controlled On no medications Check metabolic panel      Relevant Orders   Comprehensive metabolic panel   Migraine headache    Continue topamax for prophylaxis triptans contraindicated due to h/o CVA Could try Nurdtec in the future      Relevant Medications   tiZANidine (ZANAFLEX) 4 MG capsule   SUMAtriptan (IMITREX) 100 MG tablet   topiramate (TOPAMAX) 100 MG tablet     Other   Dyslipidemia    Recheck CMP and FLP Not on statin H/o CVA Likely needs statin      Relevant Orders   Lipid panel   HIV infection ([place])    Followed by ID Taking antiretrovirals with good compliance      Insomnia    Chronic and uncontrolled Followed by Psych No change to medications Wonder if his LUTS may be contributing - he has upcoming appt with Urology May consider sleep study in the future  Other Visit Diagnoses    Encounter for annual physical exam    -  Primary   Relevant Orders   Lipid panel   Comprehensive metabolic panel   CBC w/Diff/Platelet   TSH   Hemoglobin A1c   Long-term use of high-risk medication       Relevant Orders   Lipid panel   Comprehensive metabolic panel   CBC w/Diff/Platelet   TSH   Hemoglobin A1c       Return in about 6 months (around 11/24/2019) for chronic disease f/u.   The entirety of the information documented in the History of Present Illness, Review of Systems and Physical Exam were personally obtained  by me. Portions of this information were initially documented by [facility], CMA and reviewed by me for thoroughness and accuracy.    Bacigalupo, Dionne Bucy, MD MPH [facility]

## 2019-05-27 ENCOUNTER — Other Ambulatory Visit: Admission: RE | Admit: 2019-05-27 | Payer: Managed Care, Other (non HMO) | Source: Ambulatory Visit

## 2019-05-28 ENCOUNTER — Other Ambulatory Visit
Admission: RE | Admit: 2019-05-28 | Discharge: 2019-05-28 | Disposition: A | Discharge status: Home / Self Care | Payer: Managed Care, Other (non HMO) | Source: Ambulatory Visit | Attending: Gastroenterology | Admitting: Gastroenterology

## 2019-05-28 ENCOUNTER — Other Ambulatory Visit: Payer: Self-pay

## 2019-05-28 DIAGNOSIS — Z01812 Encounter for preprocedural laboratory examination: Secondary | ICD-10-CM | POA: Insufficient documentation

## 2019-05-28 DIAGNOSIS — Z20828 Contact with and (suspected) exposure to other viral communicable diseases: Secondary | ICD-10-CM | POA: Diagnosis not present

## 2019-05-28 LAB — SARS CORONAVIRUS 2 (TAT 6-24 HRS): SARS Coronavirus 2: NEGATIVE

## 2019-05-29 ENCOUNTER — Encounter: Payer: Self-pay | Admitting: *Deleted

## 2019-05-30 ENCOUNTER — Encounter
Admission: RE | Disposition: A | Discharge status: Home / Self Care | Payer: Self-pay | Source: Home / Self Care | Attending: Gastroenterology

## 2019-05-30 ENCOUNTER — Other Ambulatory Visit: Payer: Self-pay

## 2019-05-30 ENCOUNTER — Ambulatory Visit: Payer: Managed Care, Other (non HMO) | Admitting: Anesthesiology

## 2019-05-30 ENCOUNTER — Encounter: Payer: Self-pay | Admitting: Anesthesiology

## 2019-05-30 ENCOUNTER — Ambulatory Visit
Admission: RE | Admit: 2019-05-30 | Discharge: 2019-05-30 | Disposition: A | Discharge status: Home / Self Care | Payer: Managed Care, Other (non HMO) | Attending: Gastroenterology | Admitting: Gastroenterology

## 2019-05-30 ENCOUNTER — Encounter: Payer: Self-pay | Admitting: Family Medicine

## 2019-05-30 DIAGNOSIS — Z21 Asymptomatic human immunodeficiency virus [HIV] infection status: Secondary | ICD-10-CM | POA: Insufficient documentation

## 2019-05-30 DIAGNOSIS — K635 Polyp of colon: Secondary | ICD-10-CM

## 2019-05-30 DIAGNOSIS — D124 Benign neoplasm of descending colon: Secondary | ICD-10-CM | POA: Insufficient documentation

## 2019-05-30 DIAGNOSIS — K5090 Crohn's disease, unspecified, without complications: Secondary | ICD-10-CM

## 2019-05-30 DIAGNOSIS — J45909 Unspecified asthma, uncomplicated: Secondary | ICD-10-CM | POA: Diagnosis not present

## 2019-05-30 DIAGNOSIS — Z79899 Other long term (current) drug therapy: Secondary | ICD-10-CM | POA: Diagnosis not present

## 2019-05-30 DIAGNOSIS — Z87891 Personal history of nicotine dependence: Secondary | ICD-10-CM | POA: Diagnosis not present

## 2019-05-30 DIAGNOSIS — F319 Bipolar disorder, unspecified: Secondary | ICD-10-CM | POA: Insufficient documentation

## 2019-05-30 DIAGNOSIS — R194 Change in bowel habit: Secondary | ICD-10-CM

## 2019-05-30 DIAGNOSIS — K210 Gastro-esophageal reflux disease with esophagitis: Secondary | ICD-10-CM | POA: Diagnosis not present

## 2019-05-30 DIAGNOSIS — Z888 Allergy status to other drugs, medicaments and biological substances status: Secondary | ICD-10-CM | POA: Insufficient documentation

## 2019-05-30 DIAGNOSIS — E119 Type 2 diabetes mellitus without complications: Secondary | ICD-10-CM | POA: Diagnosis not present

## 2019-05-30 DIAGNOSIS — R112 Nausea with vomiting, unspecified: Secondary | ICD-10-CM | POA: Diagnosis not present

## 2019-05-30 DIAGNOSIS — I6930 Unspecified sequelae of cerebral infarction: Secondary | ICD-10-CM | POA: Diagnosis not present

## 2019-05-30 DIAGNOSIS — Z980 Intestinal bypass and anastomosis status: Secondary | ICD-10-CM | POA: Insufficient documentation

## 2019-05-30 DIAGNOSIS — R197 Diarrhea, unspecified: Secondary | ICD-10-CM

## 2019-05-30 DIAGNOSIS — K529 Noninfective gastroenteritis and colitis, unspecified: Secondary | ICD-10-CM | POA: Diagnosis not present

## 2019-05-30 HISTORY — PX: ESOPHAGOGASTRODUODENOSCOPY (EGD) WITH PROPOFOL: SHX5813

## 2019-05-30 HISTORY — PX: COLONOSCOPY WITH PROPOFOL: SHX5780

## 2019-05-30 SURGERY — ESOPHAGOGASTRODUODENOSCOPY (EGD) WITH PROPOFOL
Anesthesia: General

## 2019-05-30 MED ORDER — EPHEDRINE SULFATE 50 MG/ML IJ SOLN
INTRAMUSCULAR | Status: DC | PRN
  Administered 2019-05-30: 10 mg via INTRAVENOUS

## 2019-05-30 MED ORDER — SODIUM CHLORIDE 0.9 % IV SOLN
INTRAVENOUS | Status: DC
  Administered 2019-05-30: 1000 mL via INTRAVENOUS

## 2019-05-30 MED ORDER — OMEPRAZOLE 40 MG PO CPDR: 40.0000 mg | DELAYED_RELEASE_CAPSULE | Freq: Every day | ORAL | 2 refills | Status: DC

## 2019-05-30 MED ORDER — PROPOFOL 10 MG/ML IV BOLUS
INTRAVENOUS | Status: DC | PRN
  Administered 2019-05-30: 100 mg via INTRAVENOUS
  Administered 2019-05-30: 20 mg via INTRAVENOUS

## 2019-05-30 MED ORDER — GLYCOPYRROLATE 0.2 MG/ML IJ SOLN
INTRAMUSCULAR | Status: DC | PRN
  Administered 2019-05-30: 0.2 mg via INTRAVENOUS

## 2019-05-30 MED ORDER — PROPOFOL 500 MG/50ML IV EMUL
INTRAVENOUS | Status: AC
  Filled 2019-05-30: qty 50

## 2019-05-30 MED ORDER — LIDOCAINE HCL (CARDIAC) PF 100 MG/5ML IV SOSY
PREFILLED_SYRINGE | INTRAVENOUS | Status: DC | PRN
  Administered 2019-05-30: 40 mg via INTRAVENOUS

## 2019-05-30 MED ORDER — PROPOFOL 500 MG/50ML IV EMUL
INTRAVENOUS | Status: DC | PRN
  Administered 2019-05-30: 150 ug/kg/min via INTRAVENOUS

## 2019-05-30 MED ORDER — MIDAZOLAM HCL 2 MG/2ML IJ SOLN
INTRAMUSCULAR | Status: DC | PRN
  Administered 2019-05-30: 2 mg via INTRAVENOUS

## 2019-05-30 MED ORDER — GLYCOPYRROLATE 0.2 MG/ML IJ SOLN
INTRAMUSCULAR | Status: AC
  Filled 2019-05-30: qty 1

## 2019-05-30 MED ORDER — MIDAZOLAM HCL 2 MG/2ML IJ SOLN
INTRAMUSCULAR | Status: AC
  Filled 2019-05-30: qty 2

## 2019-05-30 MED ORDER — PHENYLEPHRINE HCL (PRESSORS) 10 MG/ML IV SOLN
INTRAVENOUS | Status: DC | PRN
  Administered 2019-05-30: 100 ug via INTRAVENOUS
  Administered 2019-05-30: 50 ug via INTRAVENOUS

## 2019-05-30 NOTE — Anesthesia Post-op Follow-up Note (Signed)
Anesthesia QCDR form completed.        

## 2019-05-30 NOTE — Assessment & Plan Note (Signed)
Recheck CMP and FLP Not on statin H/o CVA Likely needs statin

## 2019-05-30 NOTE — Assessment & Plan Note (Signed)
Chronic and uncontrolled Followed by Psych No change to medications Wonder if his LUTS may be contributing - he has upcoming appt with Urology May consider sleep study in the future

## 2019-05-30 NOTE — Anesthesia Preprocedure Evaluation (Signed)
Anesthesia Evaluation  Patient identified by MRN, date of birth, ID band Patient awake    Reviewed: Allergy & Precautions, NPO status , Patient's Chart, lab work & pertinent test results  Airway Mallampati: II  TM Distance: >3 FB     Dental   Pulmonary asthma , former smoker,    Pulmonary exam normal        Cardiovascular Normal cardiovascular exam     Neuro/Psych  Headaches, PSYCHIATRIC DISORDERS Bipolar Disorder CVA, Residual Symptoms    GI/Hepatic   Endo/Other  diabetes  Renal/GU stones  negative genitourinary   Musculoskeletal negative musculoskeletal ROS (+)   Abdominal Normal abdominal exam  (+)   Peds negative pediatric ROS (+)  Hematology   Anesthesia Other Findings Past Medical History: No date: Asthma No date: Bipolar 1 disorder (HCC) No date: Diabetes mellitus without complication (HCC) No date: History of CVA with residual deficit No date: HIV infection (H[facility])  Reproductive/Obstetrics                             Anesthesia Physical Anesthesia Plan  ASA: III  Anesthesia Plan: General   Post-op Pain Management:    Induction: Intravenous  PONV Risk Score and Plan:   Airway Management Planned: Nasal Cannula  Additional Equipment:   Intra-op Plan:   Post-operative Plan:   Informed Consent: I have reviewed the patients History and Physical, chart, labs and discussed the procedure including the risks, benefits and alternatives for the proposed anesthesia with the patient or authorized representative who has indicated his/her understanding and acceptance.     Dental advisory given  Plan Discussed with: CRNA and Surgeon  Anesthesia Plan Comments:         Anesthesia Quick Evaluation

## 2019-05-30 NOTE — Op Note (Signed)
[facility] Patient Name: Latwan Luchsinger Procedure Date: 05/30/2019 9:03 AM MRN: [id number] Account #: [id number] Date of Birth: December 08, 1970 Admit Type: Outpatient Age: 48 Room: [facility] ENDO ROOM 2 Gender: Male Note Status: Finalized Procedure:            Colonoscopy Indications:          Chronic diarrhea, , ?crohn's disease Providers:            Lin Landsman MD, MD Medicines:            Monitored Anesthesia Care Complications:        No immediate complications. Estimated blood loss: None. Procedure:            Pre-Anesthesia Assessment:                       - Prior to the procedure, a History and Physical was                        performed, and patient medications and allergies were                        reviewed. The patient is competent. The risks and                        benefits of the procedure and the sedation options and                        risks were discussed with the patient. All questions                        were answered and informed consent was obtained.                        Patient identification and proposed procedure were                        verified by the physician, the nurse, the                        anesthesiologist, the anesthetist and the technician in                        the pre-procedure area in the procedure room in the                        endoscopy suite. Mental Status Examination: alert and                        oriented. Airway Examination: normal oropharyngeal                        airway and neck mobility. Respiratory Examination:                        clear to auscultation. CV Examination: normal.                        Prophylactic Antibiotics: The patient does not require  prophylactic antibiotics. Prior Anticoagulants: The                        patient has taken no previous anticoagulant or                        antiplatelet agents. ASA Grade Assessment: III - A                         patient with severe systemic disease. After reviewing                        the risks and benefits, the patient was deemed in                        satisfactory condition to undergo the procedure. The                        anesthesia plan was to use monitored anesthesia care                        (MAC). Immediately prior to administration of                        medications, the patient was re-assessed for adequacy                        to receive sedatives. The heart rate, respiratory rate,                        oxygen saturations, blood pressure, adequacy of                        pulmonary ventilation, and response to care were                        monitored throughout the procedure. The physical status                        of the patient was re-assessed after the procedure.                       After obtaining informed consent, the colonoscope was                        passed under direct vision. Throughout the procedure,                        the patient's blood pressure, pulse, and oxygen                        saturations were monitored continuously. The                        Colonoscope was introduced through the anus and                        advanced to the the ileocolonic anastomosis. The  colonoscopy was unusually difficult due to inadequate                        bowel prep and significant looping. Successful                        completion of the procedure was aided by applying                        abdominal pressure and lavage. The patient tolerated                        the procedure well. The quality of the bowel                        preparation was fair. Findings:      The perianal and digital rectal examinations were normal. Pertinent       negatives include normal sphincter tone and no palpable rectal lesions.      There was evidence of a prior end-to-side ileo-colonic anastomosis in       the  ascending colon. This was patent and was characterized by healthy       appearing mucosa. The anastomosis was traversed.      The neo-terminal ileum appeared normal. Biopsies were taken with a cold       forceps for histology.      A 6 mm polyp was found in the descending colon. The polyp was sessile.       The polyp was removed with a cold snare. Resection and retrieval were       complete.      Normal mucosa was found in the entire colon. Biopsies were taken with a       cold forceps for histology.      The retroflexed view of the distal rectum and anal verge was normal and       showed no anal or rectal abnormalities.      Copious quantities of liquid stool was found in the entire colon,       precluding visualization. Lavage of the area was performed using 200 -       500 mL of sterile water, resulting in clearance with fair visualization. Impression:           - Preparation of the colon was fair.                       - Patent end-to-side ileo-colonic anastomosis,                        characterized by healthy appearing mucosa.                       - The examined portion of the ileum was normal.                        Biopsied.                       - One 6 mm polyp in the descending colon, removed with                        a cold snare. Resected and retrieved.                       -  Normal mucosa in the entire examined colon. Biopsied.                       - The distal rectum and anal verge are normal on                        retroflexion view.                       - Stool in the entire examined colon. Recommendation:       - Discharge patient to home (with escort).                       - Resume previous diet today.                       - Continue present medications.                       - Await pathology results.                       - Return to my office in 1 month. Procedure Code(s):    --- Professional ---                       [phone], Colonoscopy, flexible; with  removal of tumor(s),                        polyp(s), or other lesion(s) by snare technique                       [id number], 38, Colonoscopy, flexible; with biopsy, single                        or multiple Diagnosis Code(s):    --- Professional ---                       K63.5, Polyp of colon                       K52.9, Noninfective gastroenteritis and colitis,                        unspecified                       Z98.0, Intestinal bypass and anastomosis status CPT copyright 2019 American Medical Association. All rights reserved. The codes documented in this report are preliminary and upon coder review may  be revised to meet current compliance requirements. Dr. Ulyess Mort Lin Landsman MD, MD 05/30/2019 9:56:51 AM This report has been signed electronically. Number of Addenda: 0 Note Initiated On: 05/30/2019 9:03 AM Scope Withdrawal Time: 0 hours 14 minutes 48 seconds  Total Procedure Duration: 0 hours 24 minutes 18 seconds  Estimated Blood Loss: Estimated blood loss: none.      [facility]

## 2019-05-30 NOTE — Assessment & Plan Note (Signed)
Continue topamax for prophylaxis triptans contraindicated due to h/o CVA Could try Nurdtec in the future

## 2019-05-30 NOTE — Op Note (Addendum)
[facility] Gastroenterology Patient Name: Troy [name] Procedure Date: 05/30/2019 9:04 AM MRN: [id number] Account #: [id number] Date of Birth: May 20, 1971 Admit Type: Outpatient Age: 48 Room: [facility] ENDO ROOM 2 Gender: Male Note Status: Finalized Procedure:            Upper GI endoscopy Indications:          Diarrhea, Nausea with vomiting Providers:            Lin Landsman MD, MD Referring MD:         Dionne Bucy. Bacigalupo (Referring MD) Medicines:            Monitored Anesthesia Care Complications:        No immediate complications. Estimated blood loss: None. Procedure:            Pre-Anesthesia Assessment:                       - Prior to the procedure, a History and Physical was                        performed, and patient medications and allergies were                        reviewed. The patient is competent. The risks and                        benefits of the procedure and the sedation options and                        risks were discussed with the patient. All questions                        were answered and informed consent was obtained.                        Patient identification and proposed procedure were                        verified by the physician, the nurse, the                        anesthesiologist, the anesthetist and the technician in                        the pre-procedure area in the procedure room in the                        endoscopy suite. Mental Status Examination: alert and                        oriented. Airway Examination: normal oropharyngeal                        airway and neck mobility. Respiratory Examination:                        clear to auscultation. CV Examination: normal.                        Prophylactic Antibiotics: The patient does not require  prophylactic antibiotics. Prior Anticoagulants: The                        patient has taken no previous anticoagulant or          antiplatelet agents. ASA Grade Assessment: III - A                        patient with severe systemic disease. After reviewing                        the risks and benefits, the patient was deemed in                        satisfactory condition to undergo the procedure. The                        anesthesia plan was to use monitored anesthesia care                        (MAC). Immediately prior to administration of                        medications, the patient was re-assessed for adequacy                        to receive sedatives. The heart rate, respiratory rate,                        oxygen saturations, blood pressure, adequacy of                        pulmonary ventilation, and response to care were                        monitored throughout the procedure. The physical status                        of the patient was re-assessed after the procedure.                       After obtaining informed consent, the endoscope was                        passed under direct vision. Throughout the procedure,                        the patient's blood pressure, pulse, and oxygen                        saturations were monitored continuously. The Endoscope                        was introduced through the mouth, and advanced to the                        second part of duodenum. The upper GI endoscopy was                        accomplished without difficulty. The patient tolerated  the procedure well. Findings:      LA Grade C (one or more mucosal breaks continuous between tops of 2 or       more mucosal folds, less than 75% circumference) esophagitis with no       bleeding was found in the lower third of the esophagus.      Evidence of sleeve gastrectomy, normal and healthy appearing pouch      The entire examined stomach was normal. Biopsies were taken with a cold       forceps for Helicobacter pylori testing.      The duodenal bulb and second portion of the  duodenum were normal.       Biopsies for histology were taken with a cold forceps for evaluation of       celiac disease. Impression:           - LA Grade C reflux esophagitis.                       - Normal stomach. Biopsied.                       - Normal duodenal bulb and second portion of the                        duodenum. Biopsied. Recommendation:       - Await pathology results.                       - Use a proton pump inhibitor PO BID for 3 months.                       - Repeat upper endoscopy in 3 months to check healing.                       - Return to my office in 1 month. Procedure Code(s):    --- Professional ---                       [phone], Esophagogastroduodenoscopy, flexible, transoral;                        with biopsy, single or multiple Diagnosis Code(s):    --- Professional ---                       K21.0, Gastro-esophageal reflux disease with esophagitis                       R19.7, Diarrhea, unspecified                       R11.2, Nausea with vomiting, unspecified CPT copyright 2019 American Medical Association. All rights reserved. The codes documented in this report are preliminary and upon coder review may  be revised to meet current compliance requirements. Dr. Ulyess Mort Lin Landsman MD, MD 05/30/2019 9:26:55 AM This report has been signed electronically. Number of Addenda: 0 Note Initiated On: 05/30/2019 9:04 AM Estimated Blood Loss: Estimated blood loss: none.      [facility]

## 2019-05-30 NOTE — H&P (Signed)
Troy Darby, MD [address]  [place]  [place], [place] [address]  Main: [phone]  Fax: [phone] Pager: [phone]  Primary Care Physician:  Virginia Crews, MD Primary Gastroenterologist:  Dr. Cephas [name]  Pre-Procedure History & Physical: HPI:  Troy [name] is a 48 y.o. male is here for an endoscopy and colonoscopy.   Past Medical History:  Diagnosis Date  . Asthma   . Bipolar 1 disorder ([place])   . Diabetes mellitus without complication ([place])   . History of CVA with residual deficit   . HIV infection [facility])     Past Surgical History:  Procedure Laterality Date  . COLONOSCOPY    . ELBOW SURGERY Left    fracture  . FACIAL COSMETIC SURGERY    . GASTRIC BYPASS    . HERNIA REPAIR    . KIDNEY STONE SURGERY    . renal artery repair      Prior to Admission medications   Medication Sig Start Date End Date Taking? Authorizing Provider  albuterol (VENTOLIN HFA) 108 (90 Base) MCG/ACT inhaler Inhale 1-2 puffs into the lungs every 6 (six) hours as needed for wheezing or shortness of breath. 05/24/19  Yes Bacigalupo, Dionne Bucy, MD  ARIPiprazole (ABILIFY) 10 MG tablet Take 10 mg by mouth daily.   Yes [provider]  busPIRone (BUSPAR) 15 MG tablet TK 1 T PO TID 10/05/18  Yes [provider]  busPIRone (BUSPAR) 30 MG tablet Take 30 mg by mouth 2 (two) times daily.   Yes [provider]  cetirizine (ZYRTEC) 10 MG tablet Take 1 tablet (10 mg total) by mouth daily. 05/24/19  Yes Bacigalupo, Dionne Bucy, MD  Doxepin HCl (SILENOR) 6 MG TABS Take 6 mg by mouth daily.   Yes [provider]  emtricitabine-rilpivir-tenofovir DF (COMPLERA) 200-25-300 MG tablet Take 1 tablet by mouth daily.   Yes [provider]  fluticasone (FLONASE) 50 MCG/ACT nasal spray Place 2 sprays into both nostrils daily. 02/11/19  Yes Bacigalupo, Dionne Bucy, MD  gabapentin (NEURONTIN) 300 MG capsule Take 300 mg by mouth 3 (three) times daily.   Yes [provider]  montelukast (SINGULAIR) 10 MG tablet Take 1 tablet (10 mg total) by mouth daily. 05/24/19  Yes Virginia Crews, MD  ODEFSEY 200-25-25 MG TABS tablet  02/04/19  Yes [provider]  SUMAtriptan (IMITREX) 100 MG tablet TAKE ONE TABLET BY MOUTH AT ONSET OF THE HEADACHE. MAY REPEAT IN TWO HOURS 05/24/19  Yes Bacigalupo, Dionne Bucy, MD  tiZANidine (ZANAFLEX) 4 MG capsule Take 1 capsule (4 mg total) by mouth 2 (two) times daily. 05/24/19  Yes Bacigalupo, Dionne Bucy, MD  topiramate (TOPAMAX) 100 MG tablet Take 1 tablet (100 mg total) by mouth 2 (two) times daily. 05/24/19  Yes Bacigalupo, Dionne Bucy, MD  traZODone (DESYREL) 100 MG tablet Take 100 mg by mouth 2 (two) times a day. 06/08/15  Yes [provider]  zolpidem (AMBIEN CR) 6.25 MG CR tablet Take 6.25 mg by mouth at bedtime as needed for sleep.   Yes [provider]    Allergies as of 05/09/2019 - Review Complete 04/24/2019  Allergen Reaction Noted  . Niacin Anaphylaxis 08/10/2011  . Orange oil Anaphylaxis 06/29/2016  . Topiramate  02/17/2011    Family History  Problem Relation Age of Onset  . COPD Mother   . Diabetes Mother   . Hypertension Mother   . Hyperlipidemia Mother   . Healthy Father   . Cancer  Father        skin  . Stroke Brother   . Heart disease Brother   . Seizures Brother   . Thyroid disease Brother   . Cancer Maternal Grandmother        breast  . Hyperlipidemia Maternal Grandmother   . Hypertension Maternal Grandmother   . Diabetes Maternal Grandfather   . Hyperlipidemia Maternal Grandfather   . Heart disease Paternal Grandfather   . Hypertension Paternal Grandfather     Social History   Socioeconomic History  . Marital status: Single    Spouse name: Not on file  . Number of children: 0  . Years of education: Not on file  . Highest education level: Not on file  Occupational History  . Occupation: Restaurant manager, fast food: [employer]  . Financial  resource strain: Not on file  . Food insecurity    Worry: Not on file    Inability: Not on file  . Transportation needs    Medical: Not on file    Non-medical: Not on file  Tobacco Use  . Smoking status: Former Smoker    Packs/day: 1.00    Years: 25.00    Pack years: 25.00    Types: Cigarettes    Quit date: 2013    Years since quitting: 7.6  . Smokeless tobacco: Never Used  Substance and Sexual Activity  . 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 Sexual activity: Not Currently    Partners: Male    Comment: Males  Lifestyle  . Physical activity    Days per week: Not on file    Minutes per session: Not on file  . Stress: Not on file  Relationships  . Social Herbalist on phone: Not on file    Gets together: Not on file    Attends religious service: Not on file    Active member of club or organization: Not on file    Attends meetings of clubs or organizations: Not on file    Relationship status: Not on file  . Intimate partner violence    Fear of current or ex partner: Not on file    Emotionally abused: Not on file    Physically abused: Not on file    Forced sexual activity: Not on file  Other Topics Concern  . Not on file  Social History Narrative  . Not on file    Review of Systems: See HPI, otherwise negative ROS  Physical Exam: BP 128/76   Pulse 85   Temp (!) 96.6 F (35.9 C) (Tympanic)   Resp 20   Ht 5\' 9"  (1.753 m)   Wt 72.6 kg   SpO2 100%   BMI 23.63 kg/m  General:   Alert,  pleasant and cooperative in NAD Head:  Normocephalic and atraumatic. Neck:  Supple; no masses or thyromegaly. Lungs:  Clear throughout to auscultation.    Heart:  Regular rate and rhythm. Abdomen:  Soft, nontender and nondistended. Normal bowel sounds, without guarding, and without rebound.   Neurologic:  Alert and  oriented x4;  grossly normal  neurologically.  Impression/Plan: Troy [name] is here for an endoscopy and colonoscopy to be performed for altered bowel habits, ?crohn's  Risks, benefits, limitations, and alternatives regarding  endoscopy and colonoscopy have been reviewed with the patient.  Questions have been answered.  All parties agreeable.   Sherri Sear, MD  05/30/2019, 9:00 AM

## 2019-05-30 NOTE — Transfer of Care (Signed)
Immediate Anesthesia Transfer of Care Note  Patient: Troy [name]  Procedure(s) Performed: Procedure(s): ESOPHAGOGASTRODUODENOSCOPY (EGD) WITH PROPOFOL (N/A) COLONOSCOPY WITH PROPOFOL (N/A)  Patient Location: PACU and Endoscopy Unit  Anesthesia Type:General  Level of Consciousness: sedated  Airway & Oxygen Therapy: Patient Spontanous Breathing and Patient connected to nasal cannula oxygen  Post-op Assessment: Report given to RN and Post -op Vital signs reviewed and stable  Post vital signs: Reviewed and stable  Last Vitals:  Vitals:   05/30/19 0950 05/30/19 0958  BP: (!) 94/56 (!) 94/56  Pulse: 75 72  Resp: 20 15  Temp: (!) 36.2 C   SpO2: 659% 935%    Complications: No apparent anesthesia complications

## 2019-05-30 NOTE — Assessment & Plan Note (Signed)
Followed by ID Taking antiretrovirals with good compliance

## 2019-05-30 NOTE — Assessment & Plan Note (Signed)
Seems to be diet controlled On no medications Check metabolic panel

## 2019-05-31 ENCOUNTER — Encounter: Payer: Self-pay | Admitting: Gastroenterology

## 2019-05-31 LAB — SURGICAL PATHOLOGY

## 2019-05-31 NOTE — Anesthesia Postprocedure Evaluation (Signed)
Anesthesia Post Note  Patient: Troy [name]  Procedure(s) Performed: ESOPHAGOGASTRODUODENOSCOPY (EGD) WITH PROPOFOL (N/A ) COLONOSCOPY WITH PROPOFOL (N/A )  Patient location during evaluation: Endoscopy Anesthesia Type: General Level of consciousness: awake and alert and oriented Pain management: pain level controlled Vital Signs Assessment: post-procedure vital signs reviewed and stable Respiratory status: spontaneous breathing Cardiovascular status: blood pressure returned to baseline Anesthetic complications: no     Last Vitals:  Vitals:   05/30/19 1000 05/30/19 1010  BP: 116/68 113/75  Pulse: 92 65  Resp: 17 16  Temp:    SpO2: 100% 100%    Last Pain:  Vitals:   05/30/19 0950  TempSrc: Tympanic  PainSc:                  Idriss Quackenbush

## 2019-06-06 ENCOUNTER — Ambulatory Visit: Payer: Managed Care, Other (non HMO) | Admitting: Gastroenterology

## 2019-06-10 ENCOUNTER — Ambulatory Visit: Payer: Managed Care, Other (non HMO) | Admitting: Gastroenterology

## 2019-06-14 ENCOUNTER — Ambulatory Visit (INDEPENDENT_AMBULATORY_CARE_PROVIDER_SITE_OTHER): Payer: Managed Care, Other (non HMO) | Admitting: Urology

## 2019-06-14 ENCOUNTER — Encounter: Payer: Self-pay | Admitting: Urology

## 2019-06-14 ENCOUNTER — Other Ambulatory Visit: Payer: Self-pay

## 2019-06-14 VITALS — BP 111/71 | HR 68 | Ht 69.0 in | Wt 163.9 lb

## 2019-06-14 DIAGNOSIS — R3915 Urgency of urination: Secondary | ICD-10-CM | POA: Insufficient documentation

## 2019-06-14 DIAGNOSIS — R350 Frequency of micturition: Secondary | ICD-10-CM | POA: Diagnosis not present

## 2019-06-14 DIAGNOSIS — R351 Nocturia: Secondary | ICD-10-CM | POA: Diagnosis not present

## 2019-06-14 DIAGNOSIS — N200 Calculus of kidney: Secondary | ICD-10-CM | POA: Diagnosis not present

## 2019-06-14 LAB — URINALYSIS, COMPLETE
Bilirubin, UA: NEGATIVE
Glucose, UA: NEGATIVE
Ketones, UA: NEGATIVE
Leukocytes,UA: NEGATIVE
Nitrite, UA: NEGATIVE
Protein,UA: NEGATIVE
RBC, UA: NEGATIVE
Specific Gravity, UA: 1.02 (ref 1.005–1.030)
Urobilinogen, Ur: 1 mg/dL (ref 0.2–1.0)
pH, UA: 6 (ref 5.0–7.5)

## 2019-06-14 LAB — MICROSCOPIC EXAMINATION
Bacteria, UA: NONE SEEN
RBC, Urine: NONE SEEN /hpf (ref 0–2)
WBC, UA: NONE SEEN /hpf (ref 0–5)

## 2019-06-14 LAB — BLADDER SCAN AMB NON-IMAGING

## 2019-06-14 MED ORDER — TAMSULOSIN HCL 0.4 MG PO CAPS: 0.4000 mg | ORAL_CAPSULE | Freq: Every day | ORAL | 1 refills | Status: DC

## 2019-06-14 NOTE — Progress Notes (Signed)
06/14/2019 9:03 AM   Troy [name] 1971-10-08 [id number]  Referring provider: Virginia Crews, MD [address] [place],  [place] [address]  Chief Complaint  Patient presents with  . Urinary Urgency    HPI: Troy [name] is a 48 y.o. male seen in consultation at the request of Dr. Brita Romp for evaluation of lower urinary tract symptoms.  He complains of a one-year history of urinary frequency, urgency with occasional episodes of urgency incontinence.  He has nocturia x2-3.  He states he voids with a good stream and denies urinary hesitancy.  Denies previous history of UTI or gross hematuria.  He denies previous history of urologic problems, CVA, degenerative disc disease or lower extremity weakness however his past medical history does list prior CVA with residual deficits.  He has a history of calcium oxalate stones and previous bariatric surgery.  He states he has had at least 13 stones over the years and has had multiple stents, ureteroscopy and lithotripsy.  Last imaging was a KUB in 2018 which showed a 6 mm left renal calculus.  A prior CT in 2016 showed a nonobstructing left renal calculus.  His only other complaint is occasional pelvic pain after ejaculation.  Denies prior treatment for his lower urinary tract symptoms.   PMH: Past Medical History:  Diagnosis Date  . Asthma   . Bipolar 1 disorder ([place])   . Diabetes mellitus without complication ([place])   . History of CVA with residual deficit   . HIV infection [facility])     Surgical History: Past Surgical History:  Procedure Laterality Date  . COLONOSCOPY    . COLONOSCOPY WITH PROPOFOL N/A 05/30/2019   Procedure: COLONOSCOPY WITH PROPOFOL;  Surgeon: Lin Landsman, MD;  [facility] ENDOSCOPY;  Service: Gastroenterology;  Laterality: N/A;  . ELBOW SURGERY Left    fracture  . ESOPHAGOGASTRODUODENOSCOPY (EGD) WITH PROPOFOL N/A 05/30/2019   Procedure: ESOPHAGOGASTRODUODENOSCOPY (EGD) WITH PROPOFOL;   Surgeon: Lin Landsman, MD;  Location: [place];  Service: Gastroenterology;  Laterality: N/A;  . FACIAL COSMETIC SURGERY    . GASTRIC BYPASS    . HERNIA REPAIR    . KIDNEY STONE SURGERY    . renal artery repair      Home Medications:  Allergies as of 06/14/2019      Reactions   Niacin Anaphylaxis   Orange Oil Anaphylaxis   Topiramate    Affected speech and memory      Medication List       Accurate as of June 14, 2019  9:03 AM. If you have any questions, ask your nurse or doctor.        albuterol 108 (90 Base) MCG/ACT inhaler Commonly known as: VENTOLIN HFA Inhale 1-2 puffs into the lungs every 6 (six) hours as needed for wheezing or shortness of breath.   ARIPiprazole 10 MG tablet Commonly known as: ABILIFY Take 10 mg by mouth daily.   busPIRone 30 MG tablet Commonly known as: BUSPAR Take 30 mg by mouth 2 (two) times daily.   busPIRone 15 MG tablet Commonly known as: BUSPAR TK 1 T PO TID   cetirizine 10 MG tablet Commonly known as: ZYRTEC Take 1 tablet (10 mg total) by mouth daily.   emtricitabine-rilpivir-tenofovir DF 200-25-300 MG tablet Commonly known as: COMPLERA Take 1 tablet by mouth daily.   fluticasone 50 MCG/ACT nasal spray Commonly known as: FLONASE Place 2 sprays into both nostrils daily.   gabapentin 300 MG capsule Commonly known as: NEURONTIN Take  300 mg by mouth 3 (three) times daily.   montelukast 10 MG tablet Commonly known as: SINGULAIR Take 1 tablet (10 mg total) by mouth daily.   Odefsey 200-25-25 MG Tabs tablet Generic drug: emtricitabine-rilpivir-tenofovir AF   omeprazole 40 MG capsule Commonly known as: PriLOSEC Take 1 capsule (40 mg total) by mouth daily before breakfast.   Silenor 6 MG Tabs Generic drug: Doxepin HCl Take 6 mg by mouth daily.   SUMAtriptan 100 MG tablet Commonly known as: IMITREX TAKE ONE TABLET BY MOUTH AT ONSET OF THE HEADACHE. MAY REPEAT IN TWO HOURS   tiZANidine 4 MG capsule Commonly  known as: ZANAFLEX Take 1 capsule (4 mg total) by mouth 2 (two) times daily.   topiramate 100 MG tablet Commonly known as: TOPAMAX Take 1 tablet (100 mg total) by mouth 2 (two) times daily.   traZODone 100 MG tablet Commonly known as: DESYREL Take 100 mg by mouth 2 (two) times a day.   zolpidem 6.25 MG CR tablet Commonly known as: AMBIEN CR Take 6.25 mg by mouth at bedtime as needed for sleep.       Allergies:  Allergies  Allergen Reactions  . Niacin Anaphylaxis  . Orange Oil Anaphylaxis  . Topiramate     Affected speech and memory    Family History: Family History  Problem Relation Age of Onset  . COPD Mother   . Diabetes Mother   . Hypertension Mother   . Hyperlipidemia Mother   . Healthy Father   . Cancer Father        skin  . Stroke Brother   . Heart disease Brother   . Seizures Brother   . Thyroid disease Brother   . Cancer Maternal Grandmother        breast  . Hyperlipidemia Maternal Grandmother   . Hypertension Maternal Grandmother   . Diabetes Maternal Grandfather   . Hyperlipidemia Maternal Grandfather   . Heart disease Paternal Grandfather   . Hypertension Paternal Grandfather     Social History:  reports that he quit smoking about 7 years ago. His smoking use included cigarettes. He has a 25.00 pack-year smoking history. He has never used smokeless tobacco. He reports previous alcohol use. He reports previous drug use.  ROS: UROLOGY Frequent Urination?: Yes Hard to postpone urination?: No Burning/pain with urination?: No Get up at night to urinate?: No Leakage of urine?: No Urine stream starts and stops?: No Trouble starting stream?: No Do you have to strain to urinate?: No Blood in urine?: No Urinary tract infection?: No Sexually transmitted disease?: No Injury to kidneys or bladder?: No Painful intercourse?: No Weak stream?: No Erection problems?: No Penile pain?: No  Gastrointestinal Nausea?: No Vomiting?: No  Indigestion/heartburn?: Yes Diarrhea?: No Constipation?: No  Constitutional Fever: No Night sweats?: No Weight loss?: No Fatigue?: Yes  Skin Skin rash/lesions?: No Itching?: No  Eyes Blurred vision?: No Double vision?: No  Ears/Nose/Throat Sore throat?: No Sinus problems?: No  Hematologic/Lymphatic Swollen glands?: No Easy bruising?: No  Cardiovascular Leg swelling?: No Chest pain?: No  Respiratory Cough?: No Shortness of breath?: No  Endocrine Excessive thirst?: No  Musculoskeletal Back pain?: No Joint pain?: No  Neurological Headaches?: No Dizziness?: No  Psychologic Depression?: Yes Anxiety?: Yes  Physical Exam: BP 111/71 (BP Location: Left Arm, Patient Position: Sitting, Cuff Size: Normal)   Pulse 68   Ht 5' 9"  (1.753 m)   Wt 163 lb 14.4 oz (74.3 kg)   BMI 24.20 kg/m   Constitutional:  Alert and oriented, No acute distress. HEENT: [place] AT, moist mucus membranes.  Trachea midline, no masses. Cardiovascular: No clubbing, cyanosis, or edema. Respiratory: Normal respiratory effort, no increased work of breathing. GI: Abdomen is soft, nontender, nondistended, no abdominal masses GU: No CVA tenderness.  Phallus without lesions, testes descended bilaterally.  Slight right testis atrophy estimated volume 12 cc.  Left 15 cc. Skin: No rashes, bruises or suspicious lesions. Neurologic: Grossly intact, no focal deficits, moving all 4 extremities. Psychiatric: Normal mood and affect.  Laboratory Data:  Urinalysis Dipstick/microscopy negative   Assessment & Plan:    - Lower urinary tract symptoms PVR by bladder scan was 26 mL.  His bothersome urinary symptoms are storage related.  We discussed overactive bladder secondary to outlet obstruction being the most common cause in males.  Idiopathic and neurogenic overactive bladder were also discussed.  We will give an initial trial of tamsulosin.  He will call back in 30 days regarding efficacy.  If no  significant improvement with an alpha-blocker trial will start an anticholinergic medication  - Nephrolithiasis History recurrent stone disease with last imaging in 2018 showing a nonobstructing left renal calculus.  KUB/renal ultrasound ordered and he will be notified with results and further recommendations.   Abbie Sons, [place] [address] [place], [place] [address] [phone]

## 2019-06-25 ENCOUNTER — Ambulatory Visit
Admission: RE | Admit: 2019-06-25 | Discharge: 2019-06-25 | Disposition: A | Discharge status: Home / Self Care | Payer: Managed Care, Other (non HMO) | Source: Ambulatory Visit | Attending: Urology | Admitting: Urology

## 2019-06-25 ENCOUNTER — Other Ambulatory Visit: Payer: Self-pay

## 2019-06-25 DIAGNOSIS — N200 Calculus of kidney: Secondary | ICD-10-CM | POA: Diagnosis not present

## 2019-06-27 ENCOUNTER — Telehealth: Payer: Self-pay

## 2019-06-27 NOTE — Telephone Encounter (Signed)
-----   Message from Abbie Sons, MD sent at 06/27/2019  9:22 AM EDT ----- Renal ultrasound showed nonobstructing calculi that appear stable.  I also wanted him to get a KUB which was not done.  Please have them do at his convenience.

## 2019-06-27 NOTE — Telephone Encounter (Signed)
Called pt informed him of information below, pt gave verbal understanding.

## 2019-07-02 ENCOUNTER — Ambulatory Visit (INDEPENDENT_AMBULATORY_CARE_PROVIDER_SITE_OTHER): Payer: Managed Care, Other (non HMO) | Admitting: Gastroenterology

## 2019-07-02 ENCOUNTER — Other Ambulatory Visit: Payer: Self-pay

## 2019-07-02 ENCOUNTER — Encounter: Payer: Self-pay | Admitting: Gastroenterology

## 2019-07-02 VITALS — BP 112/72 | HR 74 | Temp 98.5°F | Resp 17 | Ht 69.0 in | Wt 168.8 lb

## 2019-07-02 DIAGNOSIS — K5909 Other constipation: Secondary | ICD-10-CM

## 2019-07-02 DIAGNOSIS — K2100 Gastro-esophageal reflux disease with esophagitis, without bleeding: Secondary | ICD-10-CM

## 2019-07-02 NOTE — Progress Notes (Signed)
Cephas Darby, MD [address]  [place]  [place], [place] [address]  Main: [phone]  Fax: [phone] Pager: [phone]   Primary Care Physician: Virginia Crews, MD  Primary Gastroenterologist:  Dr. Cephas Darby  Chief Complaint  Patient presents with  . Follow-up    procedure    HPI: Troy [name] is a 48 y.o. male is here for follow-up of regurgitation, chronic constipation  Chronic constipation/altered bowel habits Patient is currently having bowel movement every 6 or 7 today.  The stool consistency on [facility] scale is anywhere from 4-7.  He reports feeling bloated associated with severe abdominal pain before having a bowel movement, he has not tried any bowel regimen.  He underwent EGD which revealed LA grade C esophagitis and colonoscopy was unremarkable including biopsies.  I started him on omeprazole 40 mg daily before breakfast which he is currently taking.  He reports that episodes of regurgitation are less frequent, he had 3 episodes in last 1 month.  He reports having late dinner, goes to bed and 30 minutes after he eats.  He has in fact gained about 5 pounds since last visit.  He has been eating ice cream regularly.  Current Outpatient Medications  Medication Sig Dispense Refill  . albuterol (VENTOLIN HFA) 108 (90 Base) MCG/ACT inhaler Inhale 1-2 puffs into the lungs every 6 (six) hours as needed for wheezing or shortness of breath. 18 g 3  . ALPRAZolam (XANAX) 0.5 MG tablet     . ARIPiprazole (ABILIFY) 10 MG tablet Take 10 mg by mouth daily.    . busPIRone (BUSPAR) 30 MG tablet Take 30 mg by mouth 2 (two) times daily.    . cetirizine (ZYRTEC) 10 MG tablet Take 1 tablet (10 mg total) by mouth daily. 90 tablet 3  . Doxepin HCl (SILENOR) 6 MG TABS Take 6 mg by mouth daily.    Marland Kitchen emtricitabine-rilpivir-tenofovir DF (COMPLERA) 200-25-300 MG tablet Take 1 tablet by mouth daily.    . fluticasone (FLONASE) 50 MCG/ACT nasal spray Place 2 sprays into  both nostrils daily. 16 g 6  . gabapentin (NEURONTIN) 300 MG capsule Take 300 mg by mouth 3 (three) times daily.    . montelukast (SINGULAIR) 10 MG tablet Take 1 tablet (10 mg total) by mouth daily. 90 tablet 3  . ODEFSEY 200-25-25 MG TABS tablet     . SUMAtriptan (IMITREX) 100 MG tablet TAKE ONE TABLET BY MOUTH AT ONSET OF THE HEADACHE. MAY REPEAT IN TWO HOURS 10 tablet 3  . tamsulosin (FLOMAX) 0.4 MG CAPS capsule Take 1 capsule (0.4 mg total) by mouth daily. 30 capsule 1  . tiZANidine (ZANAFLEX) 4 MG capsule Take 1 capsule (4 mg total) by mouth 2 (two) times daily. 180 capsule 1  . topiramate (TOPAMAX) 100 MG tablet Take 1 tablet (100 mg total) by mouth 2 (two) times daily. 180 tablet 3  . traZODone (DESYREL) 100 MG tablet Take 100 mg by mouth 2 (two) times a day.    . zolpidem (AMBIEN CR) 6.25 MG CR tablet Take 6.25 mg by mouth at bedtime as needed for sleep.    . busPIRone (BUSPAR) 15 MG tablet TK 1 T PO TID    . omeprazole (PRILOSEC) 40 MG capsule Take 1 capsule (40 mg total) by mouth daily before breakfast. 30 capsule 2   No current facility-administered medications for this visit.     Allergies as of 07/02/2019 - Review Complete 07/02/2019  Allergen Reaction Noted  .  Niacin Anaphylaxis 08/10/2011  . Orange oil Anaphylaxis 06/29/2016  . Topiramate  02/17/2011    NSAIDs: None  Antiplts/Anticoagulants/Anti thrombotics: None  GI procedures:  Partial gastrectomy in 11/2011 for morbid obesity FINAL DIAGNOSIS:       A. STOMACH, PARTIAL GASTRECTOMY:       --PORTION OF STOMACH SHOWING BENIGN FUNDIC-TYPE MUCOSA       WITHOUT DIAGNOSTIC ABNORMALITY.       --REMOVED DURING SURGERY FOR MORBID OBESITY.  EGD and colonoscopy in 08/2014 at [facility] for epigastric pain and evaluate for Crohn's FINAL DIAGNOSIS:       A. GASTRIC MUCOSA, ENDOSCOPIC BIOPSIES:       --GASTRIC BODY AND ANTRAL MUCOSA DEMONSTRATING REACTIVE       GASTROPATHY.        --NO EVIDENCE OF CHRONIC ACTIVE INFLAMMATION.       --NO MORPHOLOGIC FEATURES OF H. PYLORI.       --NEGATIVE FOR INTESTINAL METAPLASIA OR MALIGNANCY.        B. TERMINAL ILEUM, ENDOSCOPIC BIOPSIES:       --SMALL INTESTINAL MUCOSA CONSISTENT WITH ILEAL ORIGIN.       --NEGATIVE FOR INFLAMMATORY OR NEOPLASTIC FEATURES.        C. ILEAL POLYP, ENDOSCOPIC BIOPSY:       --FRAGMENTED SMALL INTESTINAL MUCOSA DEMONSTRATING LOW GRADE       NEUROENDOCRINE NEOPLASM (CARCINOID TUMOR).       --CARCINOID DEMONSTRATES TRABECULAR AND TUBULAR FEATURES.       --NEOPLASM MEASURES APPROXIMATELY 0.5 CM IN GREATEST       DIMENSION ON THIS SPECIMEN.       --NEOPLASTIC ELEMENTS INCOMPLETELY EXCISED. SEE COMMENT.        D. CECUM, ENDOSCOPIC BIOPSIES:       --BENIGN COLORECTAL MUCOSA DEMONSTRATING NO SIGNIFICANT       PATHOLOGIC ABNORMALITY.        E. ASCENDING COLONIC MUCOSA, ENDOSCOPIC BIOPSIES:       --BENIGN COLORECTAL MUCOSA DEMONSTRATING NO SIGNIFICANT       PATHOLOGIC ABNORMALITY.        F. TRANSVERSE COLONIC MUCOSA, ENDOSCOPIC BIOPSIES:       --BENIGN COLORECTAL MUCOSA DEMONSTRATING NO SIGNIFICANT       PATHOLOGIC ABNORMALITY.        G. DESCENDING COLONIC MUCOSA, ENDOSCOPIC BIOPSIES:       --BENIGN COLORECTAL MUCOSA DEMONSTRATING NO SIGNIFICANT       PATHOLOGIC ABNORMALITY.        H. SIGMOID COLONIC MUCOSA, ENDOSCOPIC BIOPSIES:       --BENIGN COLORECTAL MUCOSA DEMONSTRATING NO SIGNIFICANT       PATHOLOGIC ABNORMALITY.        I. RECTAL MUCOSA, ENDOSCOPIC BIOPSIES:       --COLORECTAL MUCOSA.       --MICROSCOPIC LYMPHOID AGGREGATE FORMATION.       --MILD LAMINA PROPRIA EDEMA.       --NEGATIVE FOR ACTIVE OR GRANULOMATOUS INFLAMMATION.   EGD and colonoscopy 05/30/2019  - LA Grade C reflux  esophagitis. - Normal stomach. Biopsied. - Normal duodenal bulb and second portion of the duodenum. Biopsied.  - Preparation of the colon was fair. - Patent end-to-side ileo-colonic anastomosis, characterized by healthy appearing mucosa. - The examined portion of the ileum was normal. Biopsied. - One 6 mm polyp in the descending colon, removed with a cold snare. Resected and retrieved. - Normal mucosa in the entire examined colon. Biopsied. - The distal rectum and anal verge are normal on retroflexion view. - Stool in the entire  examined colon.  DIAGNOSIS:  A. DUODENUM; COLD BIOPSY:  - UNREMARKABLE DUODENAL MUCOSA.  - NEGATIVE FOR FEATURES OF CELIAC DISEASE.  - NEGATIVE FOR DYSPLASIA AND MALIGNANCY.   B. STOMACH; COLD BIOPSY:  - GASTRIC ANTRAL MUCOSA WITH MILD REACTIVE CHANGES.  - NEGATIVE FOR ACTIVE INFLAMMATION AND H. PYLORI.  - NEGATIVE FOR INTESTINAL METAPLASIA, DYSPLASIA, AND MALIGNANCY.   C. TERMINAL ILEUM; COLD BIOPSY:  - UNREMARKABLE ENTERIC MUCOSA.  - NEGATIVE FOR INFLAMMATION, DYSPLASIA, AND MALIGNANCY.   D. COLON, RANDOM; COLD BIOPSY:  - COLONIC MUCOSA WITH FOCAL MINIMAL ACTIVE INFLAMMATION.  - SEE COMMENT.  - NEGATIVE FOR DYSPLASIA AND MALIGNANCY.   Comment:  Diagnostic considerations include self-limited/infectious colitis,  NSAID-induced injury, and idiopathic inflammatory bowel disease.  Clinical correlation is recommended.   E. COLON POLYP, DESCENDING; COLD SNARE:  - TUBULAR ADENOMA.  - NEGATIVE FOR HIGH-GRADE DYSPLASIA AND MALIGNANCY.   ROS:  General: Negative for anorexia, weight loss, fever, chills, fatigue, weakness. ENT: Negative for hoarseness, difficulty swallowing , nasal congestion. CV: Negative for chest pain, angina, palpitations, dyspnea on exertion, peripheral edema.  Respiratory: Negative for dyspnea at rest, dyspnea on exertion, cough, sputum, wheezing.  GI: See history of present illness. GU:  Negative for dysuria, hematuria,  urinary incontinence, urinary frequency, nocturnal urination.  Endo: Negative for unusual weight change.    Physical Examination:   BP 112/72 (BP Location: Left Arm, Patient Position: Sitting, Cuff Size: Normal)   Pulse 74   Temp 98.5 F (36.9 C)   Resp 17   Ht 5\' 9"  (1.753 m)   Wt 168 lb 12.8 oz (76.6 kg)   BMI 24.93 kg/m   General: Well-nourished, well-developed in no acute distress.  Eyes: No icterus. Conjunctivae pink. Mouth: Oropharyngeal mucosa moist and pink , no lesions erythema or exudate. Lungs: Clear to auscultation bilaterally. Non-labored. Heart: Regular rate and rhythm, no murmurs rubs or gallops.  Abdomen: Bowel sounds are normal, nontender, nondistended, no hepatosplenomegaly or masses, no hernia , no rebound or guarding.   Extremities: No lower extremity edema. No clubbing or deformities. Neuro: Alert and oriented x 3.  Grossly intact. Skin: Warm and dry, no jaundice.   Psych: Alert and cooperative, normal mood and affect.   Imaging Studies: Ultrasound Renal Complete  Result Date: 06/26/2019 CLINICAL DATA:  Nephrolithiasis EXAM: RENAL / URINARY TRACT ULTRASOUND COMPLETE COMPARISON:  CT abdomen pelvis Mar 06, 2019 FINDINGS: Right Kidney: Renal measurements: 11.2 x 5.9 x 5.8 cm = volume: 201.2 mL . Echogenicity within normal limits. No mass or hydronephrosis visualized. Left Kidney: Renal measurements: 12.2 x 5.6 x 5.4 cm = volume: 193.5 mL. Few shadowing calculi are present in the interpolar region and lower pole of the left kidney. Largest measures 8 mm in size corresponding well with a calcifications seen on prior CT. A left upper pole calcification is not well visualized on this exam. Renal echogenicity is within normal limits. No concerning renal mass or hydronephrosis. Bladder: Appears normal for degree of bladder distention. IMPRESSION: Nonobstructive left nephrolithiasis.  No hydronephrosis. Electronically Signed   By: Lovena Le M.D.   On: 06/26/2019 05:17     Assessment and Plan:   Troy [name] is a 48 y.o. Caucasian male with HIV on Complera, carcinoid of the terminal ileum, T2N1, surgical resection with negative margins, primary ileocolonic anastomosis here for follow-up.  There is no evidence of Crohn's disease  Chronic constipation Discussed with him about bowel regimen including stool softener and fiber supplements Patient does not drink water at all,  drinks only Dr. Malachi Bonds.  Strongly advised him to start drinking water  Reflux esophagitis Continue omeprazole 40 mg daily before breakfast Reiterated on antireflux lifestyle measures  Follow up in 3 months   Dr Sherri Sear, MD

## 2019-07-05 ENCOUNTER — Ambulatory Visit: Payer: Managed Care, Other (non HMO) | Admitting: Physician Assistant
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
## 2019-07-10 ENCOUNTER — Other Ambulatory Visit: Payer: Self-pay

## 2019-07-10 ENCOUNTER — Ambulatory Visit (INDEPENDENT_AMBULATORY_CARE_PROVIDER_SITE_OTHER): Payer: Managed Care, Other (non HMO) | Admitting: Family Medicine

## 2019-07-10 ENCOUNTER — Encounter: Payer: Self-pay | Admitting: Family Medicine

## 2019-07-10 VITALS — BP 104/66 | HR 69 | Temp 97.1°F | Wt 170.0 lb

## 2019-07-10 DIAGNOSIS — L0100 Impetigo, unspecified: Secondary | ICD-10-CM

## 2019-07-10 DIAGNOSIS — G478 Other sleep disorders: Secondary | ICD-10-CM | POA: Diagnosis not present

## 2019-07-10 DIAGNOSIS — Z23 Encounter for immunization: Secondary | ICD-10-CM | POA: Diagnosis not present

## 2019-07-10 DIAGNOSIS — R400 Somnolence: Secondary | ICD-10-CM | POA: Diagnosis not present

## 2019-07-10 MED ORDER — BACITRACIN 500 UNIT/GM EX OINT: 1.0000 "application " | TOPICAL_OINTMENT | Freq: Two times a day (BID) | CUTANEOUS | 0 refills | Status: DC

## 2019-07-10 NOTE — Patient Instructions (Addendum)
Impetigo, Adult Impetigo is an infection of the skin. It commonly occurs in young children, but it can also occur in adults. The infection causes itchy blisters and sores that produce brownish-yellow fluid. As the fluid dries, it forms a thick, honey-colored crust. These skin changes usually occur on the face, but they can also affect other areas of the body. Impetigo usually goes away in 7-10 days with treatment. What are the causes? This condition is caused by two types of bacteria. It may be caused by staphylococci or streptococci bacteria. These bacteria cause impetigo when they get under the surface of the skin. This often happens after some damage to the skin, such as:  Cuts, scrapes, or scratches.  Rashes.  Insect bites, especially when you scratch the area of a bite.  Chickenpox or other illnesses that cause open skin sores.  Nail biting or chewing. Impetigo can spread easily from one person to another (is contagious). It may be spread through close skin contact or by sharing towels, clothing, or other items that an infected person has touched. What increases the risk? The following factors may make you more likely to develop this condition:  Playing sports that include skin-to-skin contact with others.  Having a skin condition with open sores, such as chickenpox.  Having diabetes.  Having a weak body defense system (immune system).  Having many skin cuts or scrapes.  Living in an area that has high humidity levels.  Having poor hygiene.  Having high levels of staphylococci in your nose. What are the signs or symptoms? The main symptom of this condition is small blisters, often on the face around the mouth and nose. In time, the blisters break open and turn into tiny sores (lesions) with a yellow crust. In some cases, the blisters cause itching or burning. With scratching, irritation, or lack of treatment, these small lesions may get larger. Other possible symptoms include:   Larger blisters.  Pus.  Swollen lymph glands. Scratching the affected area can cause impetigo to spread to other parts of the body. The bacteria can get under the fingernails and spread when you touch another area of your skin. How is this diagnosed? This condition is usually diagnosed during a physical exam. A skin sample or a sample of fluid from a blister may be taken for lab tests that involve growing bacteria (culture test). Lab tests can help to confirm the diagnosis or help to determine the best treatment. How is this treated? Treatment for this condition depends on the severity of the condition:  Mild impetigo can be treated with prescription antibiotic cream.  Oral antibiotic medicine may be used in more severe cases.  Medicines that reduce itchiness (antihistamines)may also be used. Follow these instructions at home: Medicines  Take over-the-counter and prescription medicines only as told by your health care provider.  Apply or take your antibiotic as told by your health care provider. Do not stop using the antibiotic even if your condition improves. General instructions   To help prevent impetigo from spreading to other body areas: ? Keep your fingernails short and clean. ? Do not scratch the blisters or sores. ? Cover infected areas, if necessary, to keep from scratching. ? Wash your hands often with soap and warm water.  Before applying antibiotic cream or ointment, you should: ? Gently wash the infected areas with antibacterial soap and warm water. ? Soak crusted areas in warm, soapy water using antibacterial soap. ? Gently rub the areas to remove crusts. Do not  scrub.  Do not share towels.  Wash your clothing and bedsheets in warm water that is 140F (60C) or warmer.  Stay home until you have used an antibiotic cream for 48 hours (2 days) or an oral antibiotic medicine for 24 hours (1 day). You should only return to work and activities with other people if  your skin shows significant improvement. ? You may return to contact sports after you have used antibiotic medicine for 72 hours (3 days).  Keep all follow-up visits as told by your health care provider. This is important. How is this prevented?  Wash your hands often with soap and warm water.  Do not share towels, washcloths, clothing, bedding, or razors.  Keep your fingernails short.  Keep any cuts, scrapes, bug bites, or rashes clean and covered.  Use insect repellent to prevent bug bites. Contact a health care provider if:  You develop more blisters or sores even with treatment.  Other family members get sores.  Your skin sores are not improving after 72 hours (3 days) of treatment.  You have a fever. Get help right away if:  You see spreading redness or swelling of the skin around your sores.  You see red streaks coming from your sores.  You develop a sore throat.  The area around your rash becomes warm, red, or tender to the touch.  You have dark, reddish-brown urine.  You do not urinate often or you urinate small amounts.  You are very tired (lethargic).  You have swelling in the face, hands, or feet. Summary  Impetigo is a skin infection that causes itchy blisters and sores that produce brownish-yellow fluid. As the fluid dries, it forms a crust.  This condition is caused by staphylococci or streptococci bacteria. These bacteria cause impetigo when they get under the surface of the skin, such as through cuts, rashes, bug bites, or open sores.  Treatment for this condition may include antibiotic ointment or oral antibiotics.  To help prevent impetigo from spreading to other body areas, make sure you keep your fingernails short, avoid scratching, cover any blisters, and wash your hands often.  If you have impetigo, stay home until you have used an antibiotic cream for 48 hours (2 days) or an oral antibiotic medicine for 24 hours (1 day). You should only return  to work and activities with other people if your skin shows significant improvement. This information is not intended to replace advice given to you by your health care provider. Make sure you discuss any questions you have with your health care provider. Document Released: 10/31/2014 Document Revised: 11/20/2018 Document Reviewed: 11/01/2016 Elsevier Patient Education  2020 [employer].    Sleep Apnea Sleep apnea is a condition in which breathing pauses or becomes shallow during sleep. Episodes of sleep apnea usually last 10 seconds or longer, and they may occur as many as 20 times an hour. Sleep apnea disrupts your sleep and keeps your body from getting the rest that it needs. This condition can increase your risk of certain health problems, including:  Heart attack.  Stroke.  Obesity.  Diabetes.  Heart failure.  Irregular heartbeat. What are the causes? There are three kinds of sleep apnea:  Obstructive sleep apnea. This kind is caused by a blocked or collapsed airway.  Central sleep apnea. This kind happens when the part of the brain that controls breathing does not send the correct signals to the muscles that control breathing.  Mixed sleep apnea. This is a combination  of obstructive and central sleep apnea. The most common cause of this condition is a collapsed or blocked airway. An airway can collapse or become blocked if:  Your throat muscles are abnormally relaxed.  Your tongue and tonsils are larger than normal.  You are overweight.  Your airway is smaller than normal. What increases the risk? You are more likely to develop this condition if you:  Are overweight.  Smoke.  Have a smaller than normal airway.  Are elderly.  Are male.  Drink alcohol.  Take sedatives or tranquilizers.  Have a family history of sleep apnea. What are the signs or symptoms? Symptoms of this condition include:  Trouble staying asleep.  Daytime sleepiness and tiredness.   Irritability.  Loud snoring.  Morning headaches.  Trouble concentrating.  Forgetfulness.  Decreased interest in sex.  Unexplained sleepiness.  Mood swings.  Personality changes.  Feelings of depression.  Waking up often during the night to urinate.  Dry mouth.  Sore throat. How is this diagnosed? This condition may be diagnosed with:  A medical history.  A physical exam.  A series of tests that are done while you are sleeping (sleep study). These tests are usually done in a sleep lab, but they may also be done at home. How is this treated? Treatment for this condition aims to restore normal breathing and to ease symptoms during sleep. It may involve managing health issues that can affect breathing, such as high blood pressure or obesity. Treatment may include:  Sleeping on your side.  Using a decongestant if you have nasal congestion.  Avoiding the use of depressants, including alcohol, sedatives, and narcotics.  Losing weight if you are overweight.  Making changes to your diet.  Quitting smoking.  Using a device to open your airway while you sleep, such as: ? An oral appliance. This is a custom-made mouthpiece that shifts your lower jaw forward. ? A continuous positive airway pressure (CPAP) device. This device blows air through a mask when you breathe out (exhale). ? A nasal expiratory positive airway pressure (EPAP) device. This device has valves that you put into each nostril. ? A bi-level positive airway pressure (BPAP) device. This device blows air through a mask when you breathe in (inhale) and breathe out (exhale).  Having surgery if other treatments do not work. During surgery, excess tissue is removed to create a wider airway. It is important to get treatment for sleep apnea. Without treatment, this condition can lead to:  High blood pressure.  Coronary artery disease.  In men, an inability to achieve or maintain an erection (impotence).   Reduced thinking abilities. Follow these instructions at home: Lifestyle  Make any lifestyle changes that your health care provider recommends.  Eat a healthy, well-balanced diet.  Take steps to lose weight if you are overweight.  Avoid using depressants, including alcohol, sedatives, and narcotics.  Do not use any products that contain nicotine or tobacco, such as cigarettes, e-cigarettes, and chewing tobacco. If you need help quitting, ask your health care provider. General instructions  Take over-the-counter and prescription medicines only as told by your health care provider.  If you were given a device to open your airway while you sleep, use it only as told by your health care provider.  If you are having surgery, make sure to tell your health care provider you have sleep apnea. You may need to bring your device with you.  Keep all follow-up visits as told by your health care provider. This  is important. Contact a health care provider if:  The device that you received to open your airway during sleep is uncomfortable or does not seem to be working.  Your symptoms do not improve.  Your symptoms get worse. Get help right away if:  You develop: ? Chest pain. ? Shortness of breath. ? Discomfort in your back, arms, or stomach.  You have: ? Trouble speaking. ? Weakness on one side of your body. ? Drooping in your face. These symptoms may represent a serious problem that is an emergency. Do not wait to see if the symptoms will go away. Get medical help right away. Call your local emergency services (911 in the [place] Do not drive yourself to the hospital. Summary  Sleep apnea is a condition in which breathing pauses or becomes shallow during sleep.  The most common cause is a collapsed or blocked airway.  The goal of treatment is to restore normal breathing and to ease symptoms during sleep. This information is not intended to replace advice given to you by your health  care provider. Make sure you discuss any questions you have with your health care provider. Document Released: 09/30/2002 Document Revised: 07/27/2018 Document Reviewed: 06/05/2018 Elsevier Patient Education  2020 [employer].

## 2019-07-18 ENCOUNTER — Encounter: Payer: Self-pay | Admitting: Family Medicine

## 2019-07-19 MED ORDER — CEPHALEXIN 500 MG PO CAPS: 500.0000 mg | ORAL_CAPSULE | Freq: Two times a day (BID) | ORAL | 0 refills | Status: AC

## 2019-08-05 ENCOUNTER — Other Ambulatory Visit: Payer: Self-pay

## 2019-08-05 DIAGNOSIS — R3915 Urgency of urination: Secondary | ICD-10-CM

## 2019-08-05 DIAGNOSIS — R350 Frequency of micturition: Secondary | ICD-10-CM

## 2019-08-05 MED ORDER — TAMSULOSIN HCL 0.4 MG PO CAPS: 0.4000 mg | ORAL_CAPSULE | Freq: Every day | ORAL | 3 refills | Status: DC

## 2019-08-09 ENCOUNTER — Encounter (HOSPITAL_COMMUNITY): Payer: Self-pay | Admitting: Emergency Medicine

## 2019-08-09 ENCOUNTER — Emergency Department (HOSPITAL_COMMUNITY)
Admission: EM | Admit: 2019-08-09 | Discharge: 2019-08-09 | Disposition: A | Discharge status: Home / Self Care | Payer: Managed Care, Other (non HMO) | Attending: Emergency Medicine | Admitting: Emergency Medicine

## 2019-08-09 DIAGNOSIS — R1032 Left lower quadrant pain: Secondary | ICD-10-CM | POA: Diagnosis present

## 2019-08-09 DIAGNOSIS — Z5321 Procedure and treatment not carried out due to patient leaving prior to being seen by health care provider: Secondary | ICD-10-CM | POA: Diagnosis not present

## 2019-08-09 LAB — COMPREHENSIVE METABOLIC PANEL
ALT: 17 U/L (ref 0–44)
AST: 24 U/L (ref 15–41)
Albumin: 4.3 g/dL (ref 3.5–5.0)
Alkaline Phosphatase: 43 U/L (ref 38–126)
Anion gap: 9 (ref 5–15)
BUN: 10 mg/dL (ref 6–20)
CO2: 23 mmol/L (ref 22–32)
Calcium: 9.1 mg/dL (ref 8.9–10.3)
Chloride: 105 mmol/L (ref 98–111)
Creatinine, Ser: 1.41 mg/dL — ABNORMAL HIGH (ref 0.61–1.24)
GFR calc Af Amer: >60 mL/min (ref >60)
GFR calc non Af Amer: 59 mL/min — ABNORMAL LOW (ref >60)
Glucose, Bld: 122 mg/dL — ABNORMAL HIGH (ref 70–99)
Potassium: 3.3 mmol/L — ABNORMAL LOW (ref 3.5–5.1)
Sodium: 137 mmol/L (ref 135–145)
Total Bilirubin: 0.6 mg/dL (ref 0.3–1.2)
Total Protein: 6.8 g/dL (ref 6.5–8.1)

## 2019-08-09 LAB — CBC
HCT: 42.7 % (ref 39.0–52.0)
Hemoglobin: 14.6 g/dL (ref 13.0–17.0)
MCH: 31.2 pg (ref 26.0–34.0)
MCHC: 34.2 g/dL (ref 30.0–36.0)
MCV: 91.2 fL (ref 80.0–100.0)
Platelets: 190 10*3/uL (ref 150–400)
RBC: 4.68 MIL/uL (ref 4.22–5.81)
RDW: 12.5 % (ref 11.5–15.5)
WBC: 9.1 10*3/uL (ref 4.0–10.5)
nRBC: 0 % (ref 0.0–0.2)

## 2019-08-09 LAB — LIPASE, BLOOD: Lipase: 34 U/L (ref 11–51)

## 2019-08-09 MED ORDER — ONDANSETRON 4 MG PO TBDP: 4.0000 mg | ORAL_TABLET | Freq: Once | ORAL | Status: DC | PRN

## 2019-08-09 MED ORDER — ONDANSETRON 4 MG PO TBDP
ORAL_TABLET | ORAL | Status: AC
  Filled 2019-08-09: qty 1

## 2019-08-09 MED ORDER — SODIUM CHLORIDE 0.9% FLUSH: 3.0000 mL | Freq: Once | INTRAVENOUS | Status: DC

## 2019-08-09 NOTE — ED Triage Notes (Signed)
Pt arrives to ED with c/o of LLQ pain for the last 3 days with vomiting every time he eats. Pt states he has vomited at least 10 times today.

## 2019-08-13 ENCOUNTER — Telehealth: Payer: Self-pay | Admitting: Family Medicine

## 2019-08-13 NOTE — Telephone Encounter (Signed)
Patient advised of Dr. B recommendations, he reports he will call back tomorrow if his symptoms have not improved.

## 2019-08-13 NOTE — Telephone Encounter (Signed)
Patient reports he is having abdominal pain due to vomiting so much. Patient reports he went to the ER on the 08/09/2019 but left without being seen. Patient reports that he was given some Zofran and that helped some. Patient reports that today is able to keep liquids down, drinking water and ginger ale.   Per Dr. Brita Romp patient may come into office for exam. Dr. B does not think he has COVID 19, due to just abdominal issues.

## 2019-08-13 NOTE — Telephone Encounter (Signed)
Had a Covid test on Sunday Am at [place].  Has not heard back about the results yet.  Has vomiting , diarrhea and low grade fever  CB#  [phone]  Con [facility]os

## 2019-08-14 ENCOUNTER — Telehealth: Payer: Self-pay | Admitting: Family Medicine

## 2019-08-14 ENCOUNTER — Ambulatory Visit (INDEPENDENT_AMBULATORY_CARE_PROVIDER_SITE_OTHER): Payer: Managed Care, Other (non HMO) | Admitting: Family Medicine

## 2019-08-14 ENCOUNTER — Other Ambulatory Visit: Payer: Self-pay

## 2019-08-14 ENCOUNTER — Encounter: Payer: Self-pay | Admitting: Family Medicine

## 2019-08-14 VITALS — BP 96/61 | HR 72 | Temp 98.1°F | Resp 16 | Ht 69.0 in | Wt 158.0 lb

## 2019-08-14 DIAGNOSIS — K91850 Pouchitis: Secondary | ICD-10-CM | POA: Diagnosis not present

## 2019-08-14 DIAGNOSIS — K2900 Acute gastritis without bleeding: Secondary | ICD-10-CM | POA: Diagnosis not present

## 2019-08-14 DIAGNOSIS — F319 Bipolar disorder, unspecified: Secondary | ICD-10-CM | POA: Diagnosis not present

## 2019-08-14 DIAGNOSIS — Z21 Asymptomatic human immunodeficiency virus [HIV] infection status: Secondary | ICD-10-CM

## 2019-08-14 DIAGNOSIS — R1010 Upper abdominal pain, unspecified: Secondary | ICD-10-CM | POA: Diagnosis not present

## 2019-08-14 MED ORDER — OMEPRAZOLE 40 MG PO CPDR: 40.0000 mg | DELAYED_RELEASE_CAPSULE | Freq: Two times a day (BID) | ORAL | 2 refills | Status: DC

## 2019-08-14 MED ORDER — ONDANSETRON HCL 8 MG PO TABS: 8.0000 mg | ORAL_TABLET | Freq: Four times a day (QID) | ORAL | 1 refills | Status: DC | PRN

## 2019-08-14 NOTE — Progress Notes (Signed)
Patient: Troy [name] Male    DOB: 04-Apr-1971   48 y.o.   MRN: [id number] Visit Date: 08/14/2019  Today's Provider: Wilhemena Durie, MD   Chief Complaint  Patient presents with  . Abdominal Pain  . Emesis   Subjective:     Abdominal Pain This is a new problem. The current episode started in the past 7 days. The onset quality is sudden. The problem occurs constantly. The pain is located in the generalized abdominal region. The pain is at a severity of 9/10. The pain is severe. The quality of the pain is tearing and sharp. Associated symptoms include anorexia, belching, diarrhea, headaches, vomiting and weight loss. Pertinent negatives include no arthralgias, constipation, dysuria, fever, flatus, frequency, hematochezia, hematuria, melena, myalgias or nausea. The pain is aggravated by movement. Relieved by: laying still. He has tried acetaminophen for the symptoms. The treatment provided no relief.  Pt went to ED a few days ago and had labs but left before being seen Labs were WNL. He had a negative Covid 19 from 3 days ago. Patient states he began vomiting about 1 week ago. Patient states vomiting and nausea was almost constant for several days. Patient states 3 days ago vomiting improved however his upper abdomen has began hurting. Patient states upper abdominal pain is constant. Patient also has symptoms of anorexia, belching, slight diarrhea, headaches, and weight loss (12lbs). No fever. Patient states he has been taking tylenol with no relief.   Allergies  Allergen Reactions  . Niacin Anaphylaxis  . Orange Oil Anaphylaxis  . Topiramate     Affected speech and memory     Current Outpatient Medications:  .  albuterol (VENTOLIN HFA) 108 (90 Base) MCG/ACT inhaler, Inhale 1-2 puffs into the lungs every 6 (six) hours as needed for wheezing or shortness of breath., Disp: 18 g, Rfl: 3 .  ALPRAZolam (XANAX) 0.5 MG tablet, , Disp: , Rfl:  .  ARIPiprazole (ABILIFY) 10 MG tablet,  Take 10 mg by mouth daily., Disp: , Rfl:  .  busPIRone (BUSPAR) 30 MG tablet, Take 30 mg by mouth 2 (two) times daily., Disp: , Rfl:  .  cetirizine (ZYRTEC) 10 MG tablet, Take 1 tablet (10 mg total) by mouth daily., Disp: 90 tablet, Rfl: 3 .  Doxepin HCl (SILENOR) 6 MG TABS, Take 6 mg by mouth daily., Disp: , Rfl:  .  fluticasone (FLONASE) 50 MCG/ACT nasal spray, Place 2 sprays into both nostrils daily., Disp: 16 g, Rfl: 6 .  gabapentin (NEURONTIN) 300 MG capsule, Take 300 mg by mouth 3 (three) times daily., Disp: , Rfl:  .  montelukast (SINGULAIR) 10 MG tablet, Take 1 tablet (10 mg total) by mouth daily., Disp: 90 tablet, Rfl: 3 .  ODEFSEY 200-25-25 MG TABS tablet, , Disp: , Rfl:  .  omeprazole (PRILOSEC) 40 MG capsule, Take 1 capsule (40 mg total) by mouth daily before breakfast., Disp: 30 capsule, Rfl: 2 .  SUMAtriptan (IMITREX) 100 MG tablet, TAKE ONE TABLET BY MOUTH AT ONSET OF THE HEADACHE. MAY REPEAT IN TWO HOURS, Disp: 10 tablet, Rfl: 3 .  tamsulosin (FLOMAX) 0.4 MG CAPS capsule, Take 1 capsule (0.4 mg total) by mouth daily., Disp: 90 capsule, Rfl: 3 .  tiZANidine (ZANAFLEX) 4 MG capsule, Take 1 capsule (4 mg total) by mouth 2 (two) times daily., Disp: 180 capsule, Rfl: 1 .  topiramate (TOPAMAX) 100 MG tablet, Take 1 tablet (100 mg total) by mouth 2 (two) times daily., Disp:  180 tablet, Rfl: 3 .  traZODone (DESYREL) 100 MG tablet, Take 100 mg by mouth 2 (two) times a day., Disp: , Rfl:  .  bacitracin 500 UNIT/GM ointment, Apply 1 application topically 2 (two) times daily. (Patient not taking: Reported on 08/14/2019), Disp: 30 g, Rfl: 0 .  busPIRone (BUSPAR) 15 MG tablet, TK 1 T PO TID, Disp: , Rfl:  .  emtricitabine-rilpivir-tenofovir DF (COMPLERA) 200-25-300 MG tablet, Take 1 tablet by mouth daily., Disp: , Rfl:  .  zolpidem (AMBIEN CR) 6.25 MG CR tablet, Take 6.25 mg by mouth at bedtime as needed for sleep., Disp: , Rfl:   Review of Systems  Constitutional: Positive for weight loss.  Negative for appetite change and fever.  HENT: Negative.   Eyes: Negative.   Respiratory: Negative for chest tightness, shortness of breath and wheezing.   Cardiovascular: Negative for palpitations.  Gastrointestinal: Positive for abdominal pain, anorexia, diarrhea and vomiting. Negative for constipation, flatus, hematochezia, melena and nausea.  Endocrine: Negative.   Genitourinary: Negative for dysuria, frequency and hematuria.  Musculoskeletal: Negative for arthralgias and myalgias.  Allergic/Immunologic: Negative.   Neurological: Positive for headaches.  Psychiatric/Behavioral: Negative.     Social History   Tobacco Use  . Smoking status: Former Smoker    Packs/day: 1.00    Years: 25.00    Pack years: 25.00    Types: Cigarettes    Quit date: 2013    Years since quitting: 7.8  . Smokeless tobacco: Never Used  Substance Use Topics  . 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   Physical Exam Constitutional:      Appearance: He is not toxic-appearing.     Comments: Thin.  HENT:     Head: Normocephalic and atraumatic.  Eyes:     General: No scleral icterus. Cardiovascular:     Rate and Rhythm: Normal rate and regular rhythm.  Pulmonary:     Effort: Pulmonary effort is normal.     Breath sounds: Normal breath sounds.  Abdominal:     General: Abdomen is flat. Bowel sounds are normal.     Palpations: Abdomen is soft.     Tenderness: There is abdominal tenderness in the epigastric area. There is no guarding or rebound.   Skin:    General: Skin is warm and dry.  Neurological:     General: No focal deficit present.     Mental Status: He is alert and oriented to person, place, and time.  Psychiatric:        Mood and Affect: Mood normal.        Behavior: Behavior normal.      No results found for any visits on 08/14/19.     Assessment & Plan     1. 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 Might need to go back to see bariatric surgeon. - Ambulatory referral to Gastroenterology - CBC w/Diff/Platelet - Comprehensive Metabolic Panel (CMET) - Lipase  Other orders - ondansetron (ZOFRAN) 8 MG tablet; Take 1 tablet (8 mg total) by mouth every 6 (six) hours as needed for nausea or vomiting.  Dispense: 35 tablet; Refill: 1 - omeprazole (PRILOSEC) 40 MG capsule; Take 1 capsule (40 mg total) by mouth 2 (two) times daily.  Dispense: 60 capsule; Refill: Brown, MD  [facility]

## 2019-08-14 NOTE — Telephone Encounter (Signed)
Pt called back with a lot abdominal pain.  He is still throwing up, no diarrhea.  Has not eat hardly anything since the 15th.  Had a temp 99.4 3 days ago.  It has been normal since.  Please advise if we can get him in today somewhere on your schedule  CB#  [phone]  Con Memos

## 2019-08-14 NOTE — Telephone Encounter (Signed)
Troy [name] be seeing him this morning.  He does have HIV.  I okayed him to come into the clinic as this does not sound like Covid but rather abdominal pathology.  Thank you

## 2019-08-14 NOTE — Telephone Encounter (Signed)
Patient scheduled with Dr. Rosanna Randy today at 10:40

## 2019-08-15 ENCOUNTER — Ambulatory Visit: Payer: Managed Care, Other (non HMO) | Attending: Otolaryngology

## 2019-08-15 DIAGNOSIS — R400 Somnolence: Secondary | ICD-10-CM | POA: Insufficient documentation

## 2019-08-15 DIAGNOSIS — F5104 Psychophysiologic insomnia: Secondary | ICD-10-CM | POA: Diagnosis not present

## 2019-08-15 DIAGNOSIS — R0683 Snoring: Secondary | ICD-10-CM | POA: Insufficient documentation

## 2019-08-15 DIAGNOSIS — G478 Other sleep disorders: Secondary | ICD-10-CM | POA: Diagnosis present

## 2019-08-16 ENCOUNTER — Other Ambulatory Visit: Payer: Self-pay

## 2019-08-17 LAB — COMPREHENSIVE METABOLIC PANEL
ALT: 13 IU/L (ref 0–44)
AST: 20 IU/L (ref 0–40)
Albumin/Globulin Ratio: 2.3 — ABNORMAL HIGH (ref 1.2–2.2)
Albumin: 4.8 g/dL (ref 4.0–5.0)
Alkaline Phosphatase: 58 IU/L (ref 39–117)
BUN/Creatinine Ratio: 8 — ABNORMAL LOW (ref 9–20)
BUN: 10 mg/dL (ref 6–24)
Bilirubin Total: 0.4 mg/dL (ref 0.0–1.2)
CO2: 17 mmol/L — ABNORMAL LOW (ref 20–29)
Calcium: 9.6 mg/dL (ref 8.7–10.2)
Chloride: 103 mmol/L (ref 96–106)
Creatinine, Ser: 1.2 mg/dL (ref 0.76–1.27)
GFR calc Af Amer: 83 mL/min/{1.73_m2} (ref >59)
GFR calc non Af Amer: 72 mL/min/{1.73_m2} (ref >59)
Globulin, Total: 2.1 g/dL (ref 1.5–4.5)
Glucose: 83 mg/dL (ref 65–99)
Potassium: 3.9 mmol/L (ref 3.5–5.2)
Sodium: 137 mmol/L (ref 134–144)
Total Protein: 6.9 g/dL (ref 6.0–8.5)

## 2019-08-17 LAB — CBC WITH DIFFERENTIAL/PLATELET
Basophils Absolute: 0 10*3/uL (ref 0.0–0.2)
Basos: 1 %
EOS (ABSOLUTE): 0 10*3/uL (ref 0.0–0.4)
Eos: 0 %
Hematocrit: 44.9 % (ref 37.5–51.0)
Hemoglobin: 15.3 g/dL (ref 13.0–17.7)
Immature Grans (Abs): 0 10*3/uL (ref 0.0–0.1)
Immature Granulocytes: 0 %
Lymphocytes Absolute: 1.2 10*3/uL (ref 0.7–3.1)
Lymphs: 21 %
MCH: 31 pg (ref 26.6–33.0)
MCHC: 34.1 g/dL (ref 31.5–35.7)
MCV: 91 fL (ref 79–97)
Monocytes Absolute: 0.4 10*3/uL (ref 0.1–0.9)
Monocytes: 7 %
Neutrophils Absolute: 4.1 10*3/uL (ref 1.4–7.0)
Neutrophils: 71 %
Platelets: 197 10*3/uL (ref 150–450)
RBC: 4.93 x10E6/uL (ref 4.14–5.80)
RDW: 12.9 % (ref 11.6–15.4)
WBC: 5.8 10*3/uL (ref 3.4–10.8)

## 2019-08-17 LAB — LIPASE: Lipase: 21 U/L (ref 13–78)

## 2019-08-18 ENCOUNTER — Other Ambulatory Visit: Payer: Self-pay | Admitting: Gastroenterology

## 2019-08-22 ENCOUNTER — Telehealth: Payer: Self-pay | Admitting: Family Medicine

## 2019-08-22 ENCOUNTER — Other Ambulatory Visit: Payer: Self-pay

## 2019-08-22 DIAGNOSIS — K2100 Gastro-esophageal reflux disease with esophagitis, without bleeding: Secondary | ICD-10-CM

## 2019-08-22 NOTE — Telephone Encounter (Signed)
Patient's sleep study results were received today.  Please let the patient know the following.  He had no significant apneic events during the study with a total AHI of only 1 event per hour.  He had adequate sleep time, good sleep efficiency, adequate REM sleep, and a short sleep latency.  He is encouraged to improve his sleep hygiene with decreasing caffeine and tobacco use and decreasing screen time, especially in bed.  He did describe some "blackout episodes."  He may want to discuss with neurology about any suspicion for narcolepsy.  Results of his sleep study will be scanned into his chart

## 2019-08-22 NOTE — Telephone Encounter (Signed)
Pt advised.   Thanks,   -Laura  

## 2019-08-29 ENCOUNTER — Telehealth: Payer: Self-pay | Admitting: Gastroenterology

## 2019-08-29 NOTE — Telephone Encounter (Signed)
Procedure has been cancelled per pt

## 2019-08-29 NOTE — Telephone Encounter (Signed)
Pt would like to cancel his procedure for 09/12/19 he is also scheduled for office visit 09/03/19 and he wanted to keep that. Please cancel procedure

## 2019-09-03 ENCOUNTER — Other Ambulatory Visit: Payer: Self-pay

## 2019-09-03 ENCOUNTER — Ambulatory Visit (INDEPENDENT_AMBULATORY_CARE_PROVIDER_SITE_OTHER): Payer: Managed Care, Other (non HMO) | Admitting: Gastroenterology

## 2019-09-03 ENCOUNTER — Encounter: Payer: Self-pay | Admitting: Gastroenterology

## 2019-09-03 VITALS — BP 121/75 | HR 59 | Temp 98.4°F | Resp 17 | Ht 69.0 in | Wt 157.6 lb

## 2019-09-03 DIAGNOSIS — K2100 Gastro-esophageal reflux disease with esophagitis, without bleeding: Secondary | ICD-10-CM

## 2019-09-03 DIAGNOSIS — K5909 Other constipation: Secondary | ICD-10-CM | POA: Diagnosis not present

## 2019-09-03 NOTE — Progress Notes (Signed)
Troy Darby, MD [address]  [place]  [employer] [place], [place] [address]  Main: [phone]  Fax: [phone] Pager: [phone]   Primary Care Physician: Virginia Crews, MD  Primary Gastroenterologist:  Dr. Cephas [name]  Chief Complaint  Patient presents with  . Follow-up    HPI: Troy [name] is a 48 y.o. male is here for follow-up of regurgitation, chronic constipation  Chronic constipation/altered bowel habits Patient is currently having bowel movement every 6 or 7 today.  The stool consistency on [facility] scale is anywhere from 4-7.  He reports feeling bloated associated with severe abdominal pain before having a bowel movement, he has not tried any bowel regimen.  He underwent EGD which revealed LA grade C esophagitis and colonoscopy was unremarkable including biopsies.  I started him on omeprazole 40 mg daily before breakfast which he is currently taking.  He reports that episodes of regurgitation are less frequent, he had 3 episodes in last 1 month.  He reports having late dinner, goes to bed and 30 minutes after he eats.  He has in fact gained about 5 pounds since last visit.  He has been eating ice cream regularly.  Follow-up visit 08/26/2019 He is taking omeprazole 40 mg daily and continues to have burping.  He also reports severe constipation although he is taking Metamucil and drinking water.  Last week, he did not have bowel movement for 5 days followed by 2 days of severe lower abdominal cramps associated with severe diarrhea until he completely emptied.  He reports feeling well today  Current Outpatient Medications  Medication Sig Dispense Refill  . albuterol (VENTOLIN HFA) 108 (90 Base) MCG/ACT inhaler Inhale 1-2 puffs into the lungs every 6 (six) hours as needed for wheezing or shortness of breath. 18 g 3  . ALPRAZolam (XANAX) 0.5 MG tablet     . ARIPiprazole (ABILIFY) 15 MG tablet Take 15 mg by mouth at bedtime.    . busPIRone (BUSPAR) 30 MG tablet  Take 30 mg by mouth 2 (two) times daily.    . cetirizine (ZYRTEC) 10 MG tablet Take 1 tablet (10 mg total) by mouth daily. 90 tablet 3  . Doxepin HCl (SILENOR) 6 MG TABS Take 6 mg by mouth daily.    . fluticasone (FLONASE) 50 MCG/ACT nasal spray Place 2 sprays into both nostrils daily. 16 g 6  . gabapentin (NEURONTIN) 300 MG capsule Take 300 mg by mouth 3 (three) times daily.    . montelukast (SINGULAIR) 10 MG tablet Take 1 tablet (10 mg total) by mouth daily. 90 tablet 3  . ODEFSEY 200-25-25 MG TABS tablet     . omeprazole (PRILOSEC) 40 MG capsule Take 1 capsule (40 mg total) by mouth 2 (two) times daily. 60 capsule 2  . ondansetron (ZOFRAN) 8 MG tablet Take 1 tablet (8 mg total) by mouth every 6 (six) hours as needed for nausea or vomiting. 35 tablet 1  . SUMAtriptan (IMITREX) 100 MG tablet TAKE ONE TABLET BY MOUTH AT ONSET OF THE HEADACHE. MAY REPEAT IN TWO HOURS 10 tablet 3  . tamsulosin (FLOMAX) 0.4 MG CAPS capsule Take 1 capsule (0.4 mg total) by mouth daily. 90 capsule 3  . tiZANidine (ZANAFLEX) 4 MG capsule Take 1 capsule (4 mg total) by mouth 2 (two) times daily. 180 capsule 1  . topiramate (TOPAMAX) 100 MG tablet Take 1 tablet (100 mg total) by mouth 2 (two) times daily. 180 tablet 3  . traZODone (DESYREL) 100 MG  tablet Take 100 mg by mouth 2 (two) times a day.    . ALPRAZolam (XANAX) 1 MG tablet Take 1 mg by mouth daily as needed.    . ARIPiprazole (ABILIFY) 10 MG tablet Take 10 mg by mouth daily.    . bacitracin 500 UNIT/GM ointment Apply 1 application topically 2 (two) times daily. (Patient not taking: Reported on 08/14/2019) 30 g 0  . busPIRone (BUSPAR) 15 MG tablet TK 1 T PO TID    . emtricitabine-rilpivir-tenofovir DF (COMPLERA) 200-25-300 MG tablet Take 1 tablet by mouth daily.    Marland Kitchen zolpidem (AMBIEN CR) 6.25 MG CR tablet Take 6.25 mg by mouth at bedtime as needed for sleep.     No current facility-administered medications for this visit.     Allergies as of 09/03/2019 -  Review Complete 09/03/2019  Allergen Reaction Noted  . Niacin Anaphylaxis 08/10/2011  . Orange oil Anaphylaxis 06/29/2016  . Topiramate  02/17/2011    NSAIDs: None  Antiplts/Anticoagulants/Anti thrombotics: None  GI procedures:  Partial gastrectomy in 11/2011 for morbid obesity FINAL DIAGNOSIS:       A. STOMACH, PARTIAL GASTRECTOMY:       --PORTION OF STOMACH SHOWING BENIGN FUNDIC-TYPE MUCOSA       WITHOUT DIAGNOSTIC ABNORMALITY.       --REMOVED DURING SURGERY FOR MORBID OBESITY.  EGD and colonoscopy in 08/2014 at [facility] for epigastric pain and evaluate for Crohn's FINAL DIAGNOSIS:       A. GASTRIC MUCOSA, ENDOSCOPIC BIOPSIES:       --GASTRIC BODY AND ANTRAL MUCOSA DEMONSTRATING REACTIVE       GASTROPATHY.       --NO EVIDENCE OF CHRONIC ACTIVE INFLAMMATION.       --NO MORPHOLOGIC FEATURES OF H. PYLORI.       --NEGATIVE FOR INTESTINAL METAPLASIA OR MALIGNANCY.        B. TERMINAL ILEUM, ENDOSCOPIC BIOPSIES:       --SMALL INTESTINAL MUCOSA CONSISTENT WITH ILEAL ORIGIN.       --NEGATIVE FOR INFLAMMATORY OR NEOPLASTIC FEATURES.        C. ILEAL POLYP, ENDOSCOPIC BIOPSY:       --FRAGMENTED SMALL INTESTINAL MUCOSA DEMONSTRATING LOW GRADE       NEUROENDOCRINE NEOPLASM (CARCINOID TUMOR).       --CARCINOID DEMONSTRATES TRABECULAR AND TUBULAR FEATURES.       --NEOPLASM MEASURES APPROXIMATELY 0.5 CM IN GREATEST       DIMENSION ON THIS SPECIMEN.       --NEOPLASTIC ELEMENTS INCOMPLETELY EXCISED. SEE COMMENT.        D. CECUM, ENDOSCOPIC BIOPSIES:       --BENIGN COLORECTAL MUCOSA DEMONSTRATING NO SIGNIFICANT       PATHOLOGIC ABNORMALITY.        E. ASCENDING COLONIC MUCOSA, ENDOSCOPIC BIOPSIES:       --BENIGN COLORECTAL MUCOSA DEMONSTRATING NO SIGNIFICANT       PATHOLOGIC ABNORMALITY.        F. TRANSVERSE  COLONIC MUCOSA, ENDOSCOPIC BIOPSIES:       --BENIGN COLORECTAL MUCOSA DEMONSTRATING NO SIGNIFICANT       PATHOLOGIC ABNORMALITY.        G. DESCENDING COLONIC MUCOSA, ENDOSCOPIC BIOPSIES:       --BENIGN COLORECTAL MUCOSA DEMONSTRATING NO SIGNIFICANT       PATHOLOGIC ABNORMALITY.        H. SIGMOID COLONIC MUCOSA, ENDOSCOPIC BIOPSIES:       --BENIGN COLORECTAL MUCOSA DEMONSTRATING NO SIGNIFICANT       PATHOLOGIC ABNORMALITY.  I. RECTAL MUCOSA, ENDOSCOPIC BIOPSIES:       --COLORECTAL MUCOSA.       --MICROSCOPIC LYMPHOID AGGREGATE FORMATION.       --MILD LAMINA PROPRIA EDEMA.       --NEGATIVE FOR ACTIVE OR GRANULOMATOUS INFLAMMATION.   EGD and colonoscopy 05/30/2019  - LA Grade C reflux esophagitis. - Normal stomach. Biopsied. - Normal duodenal bulb and second portion of the duodenum. Biopsied.  - Preparation of the colon was fair. - Patent end-to-side ileo-colonic anastomosis, characterized by healthy appearing mucosa. - The examined portion of the ileum was normal. Biopsied. - One 6 mm polyp in the descending colon, removed with a cold snare. Resected and retrieved. - Normal mucosa in the entire examined colon. Biopsied. - The distal rectum and anal verge are normal on retroflexion view. - Stool in the entire examined colon.  DIAGNOSIS:  A. DUODENUM; COLD BIOPSY:  - UNREMARKABLE DUODENAL MUCOSA.  - NEGATIVE FOR FEATURES OF CELIAC DISEASE.  - NEGATIVE FOR DYSPLASIA AND MALIGNANCY.   B. STOMACH; COLD BIOPSY:  - GASTRIC ANTRAL MUCOSA WITH MILD REACTIVE CHANGES.  - NEGATIVE FOR ACTIVE INFLAMMATION AND H. PYLORI.  - NEGATIVE FOR INTESTINAL METAPLASIA, DYSPLASIA, AND MALIGNANCY.   C. TERMINAL ILEUM; COLD BIOPSY:  - UNREMARKABLE ENTERIC MUCOSA.  - NEGATIVE FOR INFLAMMATION, DYSPLASIA, AND MALIGNANCY.   D. COLON, RANDOM; COLD BIOPSY:  - COLONIC MUCOSA WITH FOCAL MINIMAL ACTIVE INFLAMMATION.   - SEE COMMENT.  - NEGATIVE FOR DYSPLASIA AND MALIGNANCY.   Comment:  Diagnostic considerations include self-limited/infectious colitis,  NSAID-induced injury, and idiopathic inflammatory bowel disease.  Clinical correlation is recommended.   E. COLON POLYP, DESCENDING; COLD SNARE:  - TUBULAR ADENOMA.  - NEGATIVE FOR HIGH-GRADE DYSPLASIA AND MALIGNANCY.   ROS:  General: Negative for anorexia, weight loss, fever, chills, fatigue, weakness. ENT: Negative for hoarseness, difficulty swallowing , nasal congestion. CV: Negative for chest pain, angina, palpitations, dyspnea on exertion, peripheral edema.  Respiratory: Negative for dyspnea at rest, dyspnea on exertion, cough, sputum, wheezing.  GI: See history of present illness. GU:  Negative for dysuria, hematuria, urinary incontinence, urinary frequency, nocturnal urination.  Endo: Negative for unusual weight change.    Physical Examination:   BP 121/75 (BP Location: Left Arm, Patient Position: Sitting, Cuff Size: Large)   Pulse (!) 59   Temp 98.4 F (36.9 C)   Resp 17   Ht 5\' 9"  (1.753 m)   Wt 157 lb 9.6 oz (71.5 kg)   BMI 23.27 kg/m   General: Well-nourished, well-developed in no acute distress.  Eyes: No icterus. Conjunctivae pink. Mouth: Oropharyngeal mucosa moist and pink , no lesions erythema or exudate. Lungs: Clear to auscultation bilaterally. Non-labored. Heart: Regular rate and rhythm, no murmurs rubs or gallops.  Abdomen: Bowel sounds are normal, nontender, nondistended, no hepatosplenomegaly or masses, no hernia , no rebound or guarding.   Extremities: No lower extremity edema. No clubbing or deformities. Neuro: Alert and oriented x 3.  Grossly intact. Skin: Warm and dry, no jaundice.   Psych: Alert and cooperative, normal mood and affect.   Imaging Studies: No results found.  Assessment and Plan:   Troy [name] is a 48 y.o. 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  There is no evidence of Crohn's disease  Chronic constipation Trial of Linzess 145 MCG daily, samples provided  LA grade C esophagitis Continue omeprazole 40 mg daily before breakfast Reiterated on  antireflux lifestyle measures Recommend EGD to confirm healing and evaluate for underlying Barrett's  I have discussed alternative options, risks & benefits,  which include, but are not limited to, bleeding, infection, perforation,respiratory complication & drug reaction.  The patient agrees with this plan & written consent will be obtained.     Follow up in 3 months   Dr Sherri Sear, MD

## 2019-09-03 NOTE — Addendum Note (Signed)
Addended by: Cephas Darby on: 09/03/2019 02:24 PM   Modules accepted: Level of Service

## 2019-09-09 ENCOUNTER — Other Ambulatory Visit: Payer: Self-pay

## 2019-09-09 ENCOUNTER — Other Ambulatory Visit: Payer: Self-pay | Admitting: Gastroenterology

## 2019-09-09 ENCOUNTER — Other Ambulatory Visit
Admission: RE | Admit: 2019-09-09 | Discharge: 2019-09-09 | Disposition: A | Discharge status: Home / Self Care | Payer: Managed Care, Other (non HMO) | Source: Ambulatory Visit | Attending: Gastroenterology | Admitting: Gastroenterology

## 2019-09-09 ENCOUNTER — Encounter: Payer: Self-pay | Admitting: Gastroenterology

## 2019-09-09 DIAGNOSIS — K5909 Other constipation: Secondary | ICD-10-CM

## 2019-09-09 DIAGNOSIS — Z20828 Contact with and (suspected) exposure to other viral communicable diseases: Secondary | ICD-10-CM | POA: Insufficient documentation

## 2019-09-09 DIAGNOSIS — Z01812 Encounter for preprocedural laboratory examination: Secondary | ICD-10-CM | POA: Insufficient documentation

## 2019-09-09 LAB — SARS CORONAVIRUS 2 (TAT 6-24 HRS): SARS Coronavirus 2: NEGATIVE

## 2019-09-09 MED ORDER — LINACLOTIDE 145 MCG PO CAPS: 145.0000 ug | ORAL_CAPSULE | Freq: Every day | ORAL | 1 refills | Status: DC

## 2019-09-12 ENCOUNTER — Ambulatory Visit: Payer: Managed Care, Other (non HMO) | Admitting: Certified Registered Nurse Anesthetist

## 2019-09-12 ENCOUNTER — Ambulatory Visit: Admit: 2019-09-12 | Payer: Managed Care, Other (non HMO) | Admitting: Gastroenterology

## 2019-09-12 ENCOUNTER — Ambulatory Visit
Admission: RE | Admit: 2019-09-12 | Discharge: 2019-09-12 | Disposition: A | Discharge status: Home / Self Care | Payer: Managed Care, Other (non HMO) | Attending: Gastroenterology | Admitting: Gastroenterology

## 2019-09-12 ENCOUNTER — Other Ambulatory Visit: Payer: Self-pay

## 2019-09-12 ENCOUNTER — Encounter
Admission: RE | Disposition: A | Discharge status: Home / Self Care | Payer: Self-pay | Source: Home / Self Care | Attending: Gastroenterology

## 2019-09-12 DIAGNOSIS — Z7951 Long term (current) use of inhaled steroids: Secondary | ICD-10-CM | POA: Insufficient documentation

## 2019-09-12 DIAGNOSIS — J45909 Unspecified asthma, uncomplicated: Secondary | ICD-10-CM | POA: Insufficient documentation

## 2019-09-12 DIAGNOSIS — K2100 Gastro-esophageal reflux disease with esophagitis, without bleeding: Secondary | ICD-10-CM | POA: Insufficient documentation

## 2019-09-12 DIAGNOSIS — K228 Other specified diseases of esophagus: Secondary | ICD-10-CM | POA: Diagnosis not present

## 2019-09-12 DIAGNOSIS — Z9884 Bariatric surgery status: Secondary | ICD-10-CM | POA: Diagnosis not present

## 2019-09-12 DIAGNOSIS — Z8249 Family history of ischemic heart disease and other diseases of the circulatory system: Secondary | ICD-10-CM | POA: Diagnosis not present

## 2019-09-12 DIAGNOSIS — Z87891 Personal history of nicotine dependence: Secondary | ICD-10-CM | POA: Insufficient documentation

## 2019-09-12 DIAGNOSIS — Z79899 Other long term (current) drug therapy: Secondary | ICD-10-CM | POA: Insufficient documentation

## 2019-09-12 DIAGNOSIS — Z21 Asymptomatic human immunodeficiency virus [HIV] infection status: Secondary | ICD-10-CM | POA: Insufficient documentation

## 2019-09-12 DIAGNOSIS — E119 Type 2 diabetes mellitus without complications: Secondary | ICD-10-CM | POA: Insufficient documentation

## 2019-09-12 DIAGNOSIS — I6930 Unspecified sequelae of cerebral infarction: Secondary | ICD-10-CM | POA: Diagnosis not present

## 2019-09-12 DIAGNOSIS — F319 Bipolar disorder, unspecified: Secondary | ICD-10-CM | POA: Diagnosis not present

## 2019-09-12 HISTORY — PX: ESOPHAGOGASTRODUODENOSCOPY (EGD) WITH PROPOFOL: SHX5813

## 2019-09-12 HISTORY — DX: Calculus of kidney: N20.0

## 2019-09-12 SURGERY — ESOPHAGOGASTRODUODENOSCOPY (EGD) WITH PROPOFOL
Anesthesia: General

## 2019-09-12 MED ORDER — SODIUM CHLORIDE 0.9 % IV SOLN
INTRAVENOUS | Status: DC
  Administered 2019-09-12: 09:00:00 via INTRAVENOUS

## 2019-09-12 MED ORDER — PROPOFOL 10 MG/ML IV BOLUS
INTRAVENOUS | Status: DC | PRN
  Administered 2019-09-12: 80 mg via INTRAVENOUS

## 2019-09-12 MED ORDER — PROPOFOL 500 MG/50ML IV EMUL
INTRAVENOUS | Status: AC
  Filled 2019-09-12: qty 50

## 2019-09-12 MED ORDER — LIDOCAINE HCL (PF) 2 % IJ SOLN
INTRAMUSCULAR | Status: AC
  Filled 2019-09-12: qty 10

## 2019-09-12 MED ORDER — LIDOCAINE HCL (CARDIAC) PF 100 MG/5ML IV SOSY
PREFILLED_SYRINGE | INTRAVENOUS | Status: DC | PRN
  Administered 2019-09-12: 50 mg via INTRAVENOUS

## 2019-09-12 MED ORDER — OMEPRAZOLE 40 MG PO CPDR: 40.0000 mg | DELAYED_RELEASE_CAPSULE | Freq: Every day | ORAL | 0 refills | Status: DC

## 2019-09-12 MED ORDER — MIDAZOLAM HCL 2 MG/2ML IJ SOLN
INTRAMUSCULAR | Status: DC | PRN
  Administered 2019-09-12: 2 mg via INTRAVENOUS

## 2019-09-12 MED ORDER — PROPOFOL 500 MG/50ML IV EMUL
INTRAVENOUS | Status: DC | PRN
  Administered 2019-09-12: 130 ug/kg/min via INTRAVENOUS

## 2019-09-12 MED ORDER — MIDAZOLAM HCL 2 MG/2ML IJ SOLN
INTRAMUSCULAR | Status: AC
  Filled 2019-09-12: qty 2

## 2019-09-12 NOTE — H&P (Signed)
Cephas Darby, MD [address]  [place]  [place], [place] [address]  Main: [phone]  Fax: [phone] Pager: [phone]  Primary Care Physician:  Virginia Crews, MD Primary Gastroenterologist:  Dr. Cephas Darby  Pre-Procedure History & Physical: HPI:  Troy [name] is a 48 y.o. male is here for an endoscopy.   Past Medical History:  Diagnosis Date  . Asthma   . Bipolar 1 disorder ([facility])   . Diabetes mellitus without complication ([facility])   . History of CVA with residual deficit   . HIV infection ([place] [facility])   . Nephrolithiasis     Past Surgical History:  Procedure Laterality Date  . COLONOSCOPY    . COLONOSCOPY WITH PROPOFOL N/A 05/30/2019   Procedure: COLONOSCOPY WITH PROPOFOL;  Surgeon: Lin Landsman, MD;  Location[facility] ENDOSCOPY;  Service: Gastroenterology;  Laterality: N/A;  . ELBOW SURGERY Left    fracture  . ESOPHAGOGASTRODUODENOSCOPY (EGD) WITH PROPOFOL N/A 05/30/2019   Procedure: ESOPHAGOGASTRODUODENOSCOPY (EGD) WITH PROPOFOL;  Surgeon: Lin Landsman, MD;  Location: [facility];  Service: Gastroenterology;  Laterality: N/A;  . FACIAL COSMETIC SURGERY    . GASTRIC BYPASS    . HERNIA REPAIR    . KIDNEY STONE SURGERY    . renal artery repair      Prior to Admission medications   Medication Sig Start Date End Date Taking? Authorizing Provider  ALPRAZolam Duanne Moron) 0.5 MG tablet  06/27/19  Yes [provider]  ALPRAZolam Duanne Moron) 1 MG tablet Take 1 mg by mouth daily as needed. 08/07/19  Yes [provider]  ARIPiprazole (ABILIFY) 10 MG tablet Take 10 mg by mouth daily.   Yes [provider]  ARIPiprazole (ABILIFY) 15 MG tablet Take 15 mg by mouth at bedtime. 08/12/19  Yes [provider]  busPIRone (BUSPAR) 15 MG tablet TK 1 T PO TID 10/05/18  Yes [provider]  busPIRone (BUSPAR) 30 MG tablet Take 30 mg by mouth 2 (two) times daily.   Yes [provider]  cetirizine (ZYRTEC) 10 MG tablet  Take 1 tablet (10 mg total) by mouth daily. 05/24/19  Yes Bacigalupo, Dionne Bucy, MD  Doxepin HCl (SILENOR) 6 MG TABS Take 6 mg by mouth daily.   Yes [provider]  linaclotide Rolan Lipa) 145 MCG CAPS capsule Take 1 capsule (145 mcg total) by mouth daily before breakfast. 09/09/19 12/08/19 Yes Maeryn Mcgath, Tally Due, MD  montelukast (SINGULAIR) 10 MG tablet Take 1 tablet (10 mg total) by mouth daily. 05/24/19  Yes Virginia Crews, MD  ODEFSEY 200-25-25 MG TABS tablet  02/04/19  Yes [provider]  omeprazole (PRILOSEC) 40 MG capsule Take 1 capsule (40 mg total) by mouth 2 (two) times daily. 08/14/19 09/13/19 Yes Jerrol Banana., MD  ondansetron (ZOFRAN) 8 MG tablet Take 1 tablet (8 mg total) by mouth every 6 (six) hours as needed for nausea or vomiting. 08/14/19  Yes Jerrol Banana., MD  SUMAtriptan (IMITREX) 100 MG tablet TAKE ONE TABLET BY MOUTH AT ONSET OF THE HEADACHE. MAY REPEAT IN TWO HOURS 05/24/19  Yes Bacigalupo, Dionne Bucy, MD  tamsulosin (FLOMAX) 0.4 MG CAPS capsule Take 1 capsule (0.4 mg total) by mouth daily. 08/05/19  Yes Stoioff, Ronda Fairly, MD  tiZANidine (ZANAFLEX) 4 MG capsule Take 1 capsule (4 mg total) by mouth 2 (two) times daily. 05/24/19  Yes Bacigalupo, Dionne Bucy, MD  topiramate (TOPAMAX) 100 MG tablet Take 1 tablet (100 mg total) by mouth 2 (two) times  daily. 05/24/19  Yes Bacigalupo, Dionne Bucy, MD  traZODone (DESYREL) 100 MG tablet Take 100 mg by mouth 2 (two) times a day. 06/08/15  Yes [provider]  albuterol (VENTOLIN HFA) 108 (90 Base) MCG/ACT inhaler Inhale 1-2 puffs into the lungs every 6 (six) hours as needed for wheezing or shortness of breath. 05/24/19   Bacigalupo, Dionne Bucy, MD  bacitracin 500 UNIT/GM ointment Apply 1 application topically 2 (two) times daily. Patient not taking: Reported on 08/14/2019 07/10/19   Virginia Crews, MD  emtricitabine-rilpivir-tenofovir DF (COMPLERA) 200-25-300 MG tablet Take 1 tablet by mouth daily.     [provider]  fluticasone (FLONASE) 50 MCG/ACT nasal spray Place 2 sprays into both nostrils daily. 02/11/19   Bacigalupo, Dionne Bucy, MD  gabapentin (NEURONTIN) 300 MG capsule Take 300 mg by mouth 3 (three) times daily.    [provider]  zolpidem (AMBIEN CR) 6.25 MG CR tablet Take 6.25 mg by mouth at bedtime as needed for sleep.    [provider]    Allergies as of 09/03/2019 - Review Complete 09/03/2019  Allergen Reaction Noted  . Niacin Anaphylaxis 08/10/2011  . Orange oil Anaphylaxis 06/29/2016  . Topiramate  02/17/2011    Family History  Problem Relation Age of Onset  . COPD Mother   . Diabetes Mother   . Hypertension Mother   . Hyperlipidemia Mother   . Healthy Father   . Cancer Father        skin  . Stroke Brother   . Heart disease Brother   . Seizures Brother   . Thyroid disease Brother   . Cancer Maternal Grandmother        breast  . Hyperlipidemia Maternal Grandmother   . Hypertension Maternal Grandmother   . Diabetes Maternal Grandfather   . Hyperlipidemia Maternal Grandfather   . Heart disease Paternal Grandfather   . Hypertension Paternal Grandfather     Social History   Socioeconomic History  . Marital status: Single    Spouse name: Not on file  . Number of children: 0  . Years of education: Not on file  . Highest education level: Not on file  Occupational History  . Occupation: Restaurant manager, fast food: [employer]  . Financial resource strain: Not on file  . Food insecurity    Worry: Not on file    Inability: Not on file  . Transportation needs    Medical: Not on file    Non-medical: Not on file  Tobacco Use  . Smoking status: Former Smoker    Packs/day: 1.00    Years: 25.00    Pack years: 25.00    Types: Cigarettes    Quit date: 2013    Years since quitting: 7.8  . Smokeless tobacco: Never Used  Substance and Sexual Activity  . 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 Sexual activity: Not Currently    Partners: Male    Comment: Males  Lifestyle  . Physical activity    Days per week: Not on file    Minutes per session: Not on file  . Stress: Not on file  Relationships  . Social Herbalist on phone: Not on file    Gets together: Not on file    Attends religious service: Not on file  Active member of club or organization: Not on file    Attends meetings of clubs or organizations: Not on file    Relationship status: Not on file  . Intimate partner violence    Fear of current or ex partner: Not on file    Emotionally abused: Not on file    Physically abused: Not on file    Forced sexual activity: Not on file  Other Topics Concern  . Not on file  Social History Narrative  . Not on file    Review of Systems: See HPI, otherwise negative ROS  Physical Exam: BP 113/86   Pulse 67   Temp (!) 96.8 F (36 C) (Temporal)   Resp 18   Ht 5\' 9"  (1.753 m)   Wt 68.9 kg   SpO2 100%   BMI 22.45 kg/m  General:   Alert,  pleasant and cooperative in NAD Head:  Normocephalic and atraumatic. Neck:  Supple; no masses or thyromegaly. Lungs:  Clear throughout to auscultation.    Heart:  Regular rate and rhythm. Abdomen:  Soft, nontender and nondistended. Normal bowel sounds, without guarding, and without rebound.   Neurologic:  Alert and  oriented x4;  grossly normal neurologically.  Impression/Plan: Zalen Kraly is here for an endoscopy to be performed for follow up of erosive esophagitis  Risks, benefits, limitations, and alternatives regarding  endoscopy have been reviewed with the patient.  Questions have been answered.  All parties agreeable.   Sherri Sear, MD  09/12/2019, 9:10 AM

## 2019-09-12 NOTE — Anesthesia Preprocedure Evaluation (Signed)
Anesthesia Evaluation  Patient identified by MRN, date of birth, ID band Patient awake    Reviewed: Allergy & Precautions, NPO status , Patient's Chart, lab work & pertinent test results  History of Anesthesia Complications Negative for: history of anesthetic complications  Airway Mallampati: II  TM Distance: >3 FB     Dental  (+) Dental Advidsory Given   Pulmonary asthma , Patient abstained from smoking., former smoker,    Pulmonary exam normal        Cardiovascular Normal cardiovascular exam     Neuro/Psych  Headaches, PSYCHIATRIC DISORDERS Bipolar Disorder CVA, Residual Symptoms    GI/Hepatic negative GI ROS, Neg liver ROS,   Endo/Other  diabetes  Renal/GU Renal disease (kidney stones)stones  negative genitourinary   Musculoskeletal negative musculoskeletal ROS (+)   Abdominal Normal abdominal exam  (+)   Peds negative pediatric ROS (+)  Hematology  (+) HIV,   Anesthesia Other Findings Past Medical History: No date: Asthma No date: Bipolar 1 disorder ([facility]) No date: Diabetes mellitus without complication ([facility]) No date: History of CVA with residual deficit No date: HIV infection ([facility])  Reproductive/Obstetrics                             Anesthesia Physical  Anesthesia Plan  ASA: III  Anesthesia Plan: General   Post-op Pain Management:    Induction: Intravenous  PONV Risk Score and Plan: 2 and Propofol infusion and TIVA  Airway Management Planned: Nasal Cannula and Natural Airway  Additional Equipment:   Intra-op Plan:   Post-operative Plan:   Informed Consent: I have reviewed the patients History and Physical, chart, labs and discussed the procedure including the risks, benefits and alternatives for the proposed anesthesia with the patient or authorized representative who has indicated his/her understanding and acceptance.     Dental advisory given  Plan Discussed  with: CRNA and Surgeon  Anesthesia Plan Comments:         Anesthesia Quick Evaluation

## 2019-09-12 NOTE — Op Note (Signed)
[facility] Gastroenterology Patient Name: Troy [name] Procedure Date: 09/12/2019 9:09 AM MRN: [id number] Account #: [id number] Date of Birth: July 03, 1971 Admit Type: Outpatient Age: 48 Room: [facility] ENDO ROOM 1 Gender: Male Note Status: Finalized Procedure:             Upper GI endoscopy Indications:           Exclusion of Barrett's esophagus, Follow-up of                         esophagitis Providers:             Lin Landsman MD, MD Referring MD:          Dionne Bucy. Bacigalupo (Referring MD) Medicines:             Monitored Anesthesia Care Complications:         No immediate complications. Estimated blood loss: None. Procedure:             Pre-Anesthesia Assessment:                        - Prior to the procedure, a History and Physical was                         performed, and patient medications and allergies were                         reviewed. The patient is competent. The risks and                         benefits of the procedure and the sedation options and                         risks were discussed with the patient. All questions                         were answered and informed consent was obtained.                         Patient identification and proposed procedure were                         verified by the physician, the nurse, the                         anesthesiologist, the anesthetist and the technician                         in the pre-procedure area in the procedure room in the                         endoscopy suite. Mental Status Examination: alert and                         oriented. Airway Examination: normal oropharyngeal                         airway and neck mobility. Respiratory Examination:  clear to auscultation. CV Examination: normal.                         Prophylactic Antibiotics: The patient does not require                         prophylactic antibiotics. Prior Anticoagulants: The                  patient has taken no previous anticoagulant or                         antiplatelet agents. ASA Grade Assessment: III - A                         patient with severe systemic disease. After reviewing                         the risks and benefits, the patient was deemed in                         satisfactory condition to undergo the procedure. The                         anesthesia plan was to use monitored anesthesia care                         (MAC). Immediately prior to administration of                         medications, the patient was re-assessed for adequacy                         to receive sedatives. The heart rate, respiratory                         rate, oxygen saturations, blood pressure, adequacy of                         pulmonary ventilation, and response to care were                         monitored throughout the procedure. The physical                         status of the patient was re-assessed after the                         procedure.                        After obtaining informed consent, the endoscope was                         passed under direct vision. Throughout the procedure,                         the patient's blood pressure, pulse, and oxygen  saturations were monitored continuously. The Endoscope                         was introduced through the mouth, and advanced to the                         second part of duodenum. The upper GI endoscopy was                         accomplished without difficulty. The patient tolerated                         the procedure well. Findings:      The duodenal bulb and second portion of the duodenum were normal.      The entire examined stomach was normal.      The cardia and gastric fundus were normal on retroflexion.      A small island of salmon-colored mucosa was present at the GE junction.       No other visible abnormalities were present. Biopsy was taken with a        cold forceps for histology.      The examined esophagus was normal. Impression:            - Normal duodenal bulb and second portion of the                         duodenum.                        - Normal stomach.                        - Salmon-colored mucosa. Biopsied.                        - Normal esophagus. Recommendation:        - Await pathology results.                        - Discharge patient to home (with escort).                        - Resume previous diet today.                        - Follow an antireflux regimen.                        - Decrease PPI to once a day for 1 month, then stop Procedure Code(s):     --- Professional ---                        [phone], Esophagogastroduodenoscopy, flexible,                         transoral; with biopsy, single or multiple Diagnosis Code(s):     --- Professional ---                        K22.8, Other specified diseases of esophagus CPT copyright 2019 American Medical Association. All rights reserved. The codes documented in  this report are preliminary and upon coder review may  be revised to meet current compliance requirements. Dr. Ulyess Mort Lin Landsman MD, MD 09/12/2019 9:41:49 AM This report has been signed electronically. Number of Addenda: 0 Note Initiated On: 09/12/2019 9:09 AM Estimated Blood Loss:  Estimated blood loss: none.      [facility]

## 2019-09-12 NOTE — Anesthesia Post-op Follow-up Note (Signed)
Anesthesia QCDR form completed.        

## 2019-09-12 NOTE — Anesthesia Postprocedure Evaluation (Signed)
Anesthesia Post Note  Patient: Nakeem Trzcinski  Procedure(s) Performed: ESOPHAGOGASTRODUODENOSCOPY (EGD) WITH PROPOFOL (N/A )  Patient location during evaluation: Endoscopy Anesthesia Type: General Level of consciousness: awake and alert Pain management: pain level controlled Vital Signs Assessment: post-procedure vital signs reviewed and stable Respiratory status: spontaneous breathing, nonlabored ventilation, respiratory function stable and patient connected to nasal cannula oxygen Cardiovascular status: blood pressure returned to baseline and stable Postop Assessment: no apparent nausea or vomiting Anesthetic complications: no     Last Vitals:  Vitals:   09/12/19 1001 09/12/19 1011  BP: 123/86 122/88  Pulse: (!) 59 (!) 59  Resp: 15 20  Temp:    SpO2: 100% 100%    Last Pain:  Vitals:   09/12/19 1011  TempSrc:   PainSc: 0-No pain                 Martha Clan

## 2019-09-12 NOTE — Transfer of Care (Signed)
Immediate Anesthesia Transfer of Care Note  Patient: Troy [name]  Procedure(s) Performed: ESOPHAGOGASTRODUODENOSCOPY (EGD) WITH PROPOFOL (N/A )  Patient Location: PACU and Endoscopy Unit  Anesthesia Type:General  Level of Consciousness: drowsy  Airway & Oxygen Therapy: Patient Spontanous Breathing  Post-op Assessment: Report given to RN and Post -op Vital signs reviewed and stable  Post vital signs: Reviewed and stable  Last Vitals:  Vitals Value Taken Time  BP 91/63 09/12/19 0941  Temp 36.1 C 09/12/19 0941  Pulse 64 09/12/19 0942  Resp 19 09/12/19 0942  SpO2 97 % 09/12/19 0942  Vitals shown include unvalidated device data.  Last Pain:  Vitals:   09/12/19 0906  TempSrc: Temporal  PainSc: 0-No pain         Complications: No apparent anesthesia complications

## 2019-09-13 ENCOUNTER — Encounter: Payer: Self-pay | Admitting: Gastroenterology

## 2019-09-13 LAB — SURGICAL PATHOLOGY

## 2019-10-01 ENCOUNTER — Ambulatory Visit: Payer: Managed Care, Other (non HMO) | Admitting: Gastroenterology

## 2019-11-20 ENCOUNTER — Encounter: Payer: Self-pay | Admitting: Gastroenterology

## 2019-11-22 ENCOUNTER — Ambulatory Visit: Payer: Managed Care, Other (non HMO) | Admitting: Family Medicine

## 2019-11-26 ENCOUNTER — Other Ambulatory Visit: Payer: Self-pay

## 2019-11-26 ENCOUNTER — Ambulatory Visit (INDEPENDENT_AMBULATORY_CARE_PROVIDER_SITE_OTHER): Payer: Managed Care, Other (non HMO) | Admitting: Family Medicine

## 2019-11-26 ENCOUNTER — Encounter: Payer: Self-pay | Admitting: Family Medicine

## 2019-11-26 VITALS — BP 118/66 | HR 65 | Temp 97.1°F | Wt 177.0 lb

## 2019-11-26 DIAGNOSIS — E785 Hyperlipidemia, unspecified: Secondary | ICD-10-CM | POA: Diagnosis not present

## 2019-11-26 DIAGNOSIS — R399 Unspecified symptoms and signs involving the genitourinary system: Secondary | ICD-10-CM | POA: Insufficient documentation

## 2019-11-26 DIAGNOSIS — Z79899 Other long term (current) drug therapy: Secondary | ICD-10-CM

## 2019-11-26 DIAGNOSIS — G43709 Chronic migraine without aura, not intractable, without status migrainosus: Secondary | ICD-10-CM | POA: Diagnosis not present

## 2019-11-26 DIAGNOSIS — I10 Essential (primary) hypertension: Secondary | ICD-10-CM | POA: Diagnosis not present

## 2019-11-26 NOTE — Assessment & Plan Note (Signed)
LUTS better with flomax - will continue Continue to follow with Urology Encouraged to go and get the KUB that was ordered by urology

## 2019-11-26 NOTE — Assessment & Plan Note (Signed)
H/o CVA Not currently on statin Likely needs statin Recheck CMP and FLP

## 2019-11-26 NOTE — Assessment & Plan Note (Signed)
Continue topamax at current dose Avoid triptans due to h/o CVA

## 2019-11-26 NOTE — Assessment & Plan Note (Signed)
Resolved s/p bariatric surgery and weight loss On no medications Repeat metabolic panel

## 2019-11-26 NOTE — Progress Notes (Signed)
Patient: Troy [name] Male    DOB: 1971-03-01   49 y.o.   MRN: [id number] Visit Date: 11/26/2019  Today's Provider: Lavon Paganini, MD   Chief Complaint  Patient presents with  . Hypertension   Subjective:     HPI    Hypertension, follow-up:  BP Readings from Last 3 Encounters:  11/26/19 118/66  09/12/19 122/88  09/03/19 121/75    He was last seen for hypertension 6 months ago.  BP at that visit was 122/88. Management since that visit includes No Changes. He reports excellent compliance with treatment. He is not having side effects.  He is not exercising. He is adherent to low salt diet.   Outside blood pressures are not being checked. He is experiencing none.  Patient denies chest pain, dyspnea, fatigue, irregular heart beat and lower extremity edema.   Cardiovascular risk factors include none.  Use of agents associated with hypertension: none.     Weight trend: increasing steadily Wt Readings from Last 3 Encounters:  11/26/19 177 lb (80.3 kg)  09/12/19 152 lb (68.9 kg)  09/03/19 157 lb 9.6 oz (71.5 kg)    Current diet: in general, a "healthy" diet    ------------------------------------------------------------------------ Dr Thornell Mule changed his medications to help him with panic overnight.  This helps with his sleep issues.  No OSA on sleep study  GERD and constipation well controlled now with omeprazole and Linzess  Urinary issues are better with Flomax, but worried about kidney stones. Never got KUB for Dr Bernardo Heater.  topamax 164m BID migraiens well controlled Not needing imitrex very often at all   Allergies  Allergen Reactions  . Niacin Anaphylaxis  . Orange Oil Anaphylaxis     Current Outpatient Medications:  .  albuterol (VENTOLIN HFA) 108 (90 Base) MCG/ACT inhaler, Inhale 1-2 puffs into the lungs every 6 (six) hours as needed for wheezing or shortness of breath., Disp: 18 g, Rfl: 3 .  ALPRAZolam (XANAX) 0.5 MG tablet, , Disp: , Rfl:    .  ALPRAZolam (XANAX) 1 MG tablet, Take 1 mg by mouth daily as needed., Disp: , Rfl:  .  ARIPiprazole (ABILIFY) 10 MG tablet, Take 10 mg by mouth daily., Disp: , Rfl:  .  busPIRone (BUSPAR) 30 MG tablet, Take 30 mg by mouth 2 (two) times daily., Disp: , Rfl:  .  cetirizine (ZYRTEC) 10 MG tablet, Take 1 tablet (10 mg total) by mouth daily., Disp: 90 tablet, Rfl: 3 .  Doxepin HCl (SILENOR) 6 MG TABS, Take 6 mg by mouth daily., Disp: , Rfl:  .  emtricitabine-rilpivir-tenofovir DF (COMPLERA) 200-25-300 MG tablet, Take 1 tablet by mouth daily., Disp: , Rfl:  .  fluticasone (FLONASE) 50 MCG/ACT nasal spray, Place 2 sprays into both nostrils daily., Disp: 16 g, Rfl: 6 .  gabapentin (NEURONTIN) 300 MG capsule, Take 300 mg by mouth 3 (three) times daily., Disp: , Rfl:  .  linaclotide (LINZESS) 145 MCG CAPS capsule, Take 1 capsule (145 mcg total) by mouth daily before breakfast., Disp: 90 capsule, Rfl: 1 .  montelukast (SINGULAIR) 10 MG tablet, Take 1 tablet (10 mg total) by mouth daily., Disp: 90 tablet, Rfl: 3 .  ODEFSEY 200-25-25 MG TABS tablet, , Disp: , Rfl:  .  ondansetron (ZOFRAN) 8 MG tablet, Take 1 tablet (8 mg total) by mouth every 6 (six) hours as needed for nausea or vomiting., Disp: 35 tablet, Rfl: 1 .  SUMAtriptan (IMITREX) 100 MG tablet, TAKE ONE TABLET BY MOUTH  AT ONSET OF THE HEADACHE. MAY REPEAT IN TWO HOURS, Disp: 10 tablet, Rfl: 3 .  tamsulosin (FLOMAX) 0.4 MG CAPS capsule, Take 1 capsule (0.4 mg total) by mouth daily., Disp: 90 capsule, Rfl: 3 .  tiZANidine (ZANAFLEX) 4 MG capsule, Take 1 capsule (4 mg total) by mouth 2 (two) times daily., Disp: 180 capsule, Rfl: 1 .  topiramate (TOPAMAX) 100 MG tablet, Take 1 tablet (100 mg total) by mouth 2 (two) times daily., Disp: 180 tablet, Rfl: 3 .  traZODone (DESYREL) 100 MG tablet, Take 100 mg by mouth 2 (two) times a day., Disp: , Rfl:  .  ARIPiprazole (ABILIFY) 15 MG tablet, Take 15 mg by mouth at bedtime., Disp: , Rfl:  .  busPIRone  (BUSPAR) 15 MG tablet, TK 1 T PO TID, Disp: , Rfl:  .  omeprazole (PRILOSEC) 40 MG capsule, Take 1 capsule (40 mg total) by mouth daily before breakfast., Disp: 30 capsule, Rfl: 0 .  zolpidem (AMBIEN CR) 6.25 MG CR tablet, Take 6.25 mg by mouth at bedtime as needed for sleep., Disp: , Rfl:   Review of Systems  Constitutional: Negative.   Respiratory: Negative.   Cardiovascular: Negative.   Gastrointestinal: Negative.   Neurological: Negative for dizziness, light-headedness and headaches.    Social History   Tobacco Use  . Smoking status: Former Smoker    Packs/day: 1.00    Years: 25.00    Pack years: 25.00    Types: Cigarettes    Quit date: 2013    Years since quitting: 8.0  . Smokeless tobacco: Never Used  Substance Use Topics  . 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   Physical Exam Vitals reviewed.  Constitutional:      General: He is not in acute distress.    Appearance: Normal appearance. He is not diaphoretic.  HENT:     Head: Normocephalic and atraumatic.  Eyes:     General: No scleral icterus.    Conjunctiva/sclera: Conjunctivae normal.  Cardiovascular:     Rate and Rhythm: Normal rate and regular rhythm.     Pulses: Normal pulses.     Heart sounds: Normal heart sounds. No murmur.  Pulmonary:     Effort: Pulmonary effort is normal. No respiratory distress.     Breath sounds: Normal breath sounds. No wheezing or rhonchi.  Abdominal:     General: There is no distension.     Palpations: Abdomen is soft.     Tenderness: There is no abdominal tenderness.   Musculoskeletal:     Cervical back: Neck supple.     Right lower leg: No edema.     Left lower leg: No edema.  Lymphadenopathy:     Cervical: No cervical adenopathy.  Skin:    General: Skin is warm and dry.     Capillary Refill: Capillary refill takes less than 2 seconds.     Findings: No rash.  Neurological:     Mental Status:  He is alert and oriented to person, place, and time. Mental status is at baseline.     Cranial Nerves: No cranial nerve deficit.  Psychiatric:        Mood and Affect: Mood normal.        Behavior: Behavior normal.      No results found for any visits on 11/26/19.     Assessment & Plan    Problem List Items Addressed This Visit      Cardiovascular and Mediastinum   Migraine headache    Continue topamax at current dose Avoid triptans due to h/o CVA      RESOLVED: Essential hypertension - Primary    Resolved s/p bariatric surgery and weight loss On no medications Repeat metabolic panel        Other   Dyslipidemia    H/o CVA Not currently on statin Likely needs statin Recheck CMP and FLP      Relevant Orders   Lipid panel   Comprehensive metabolic panel   Lower urinary tract symptoms (LUTS)    LUTS better with flomax - will continue Continue to follow with Urology Encouraged to go and get the KUB that was ordered by urology       Other Visit Diagnoses    Long-term use of high-risk medication       Relevant Orders   Lipid panel   Comprehensive metabolic panel   Hemoglobin A1c       Return in about 6 months (around 05/25/2020) for CPE.   The entirety of the information documented in the History of Present Illness, Review of Systems and Physical Exam were personally obtained by me. Portions of this information were initially documented by Ashley Royalty, CMA and reviewed by me for thoroughness and accuracy.    Cailee Blanke, Dionne Bucy, MD MPH [facility]

## 2019-11-30 ENCOUNTER — Other Ambulatory Visit: Payer: Self-pay | Admitting: Family Medicine

## 2019-11-30 NOTE — Telephone Encounter (Signed)
  Notes to clinic:  This Rx can not be delegated.    Requested Prescriptions  Pending Prescriptions Disp Refills   tiZANidine (ZANAFLEX) 4 MG capsule [Pharmacy Med Name: TIZANIDINE HCL 4 MG CAPSULE] 180 capsule 1    Sig: TAKE 1 CAPSULE BY MOUTH 2 TIMES DAILY.      Not Delegated - Cardiovascular:  Alpha-2 Agonists - tizanidine Failed - 11/30/2019 12:53 AM      Failed - This refill cannot be delegated      Passed - Valid encounter within last 6 months    Recent Outpatient Visits           4 days ago Essential hypertension   [facility] Virginia Crews, MD   3 months ago Pain of upper abdomen   [facility] Jerrol Banana., MD   4 months ago Non-restorative sleep   [facility] [place], Dionne Bucy, MD   6 months ago Encounter for annual physical exam   [facility], Dionne Bucy, MD   7 months ago Urinary urgency   [facility] [place], Dionne Bucy, MD

## 2019-12-04 ENCOUNTER — Telehealth: Payer: Self-pay | Admitting: Gastroenterology

## 2019-12-04 NOTE — Telephone Encounter (Signed)
Pt is calling he needs Prior authorization for rx Linzess he only has 1 pill left

## 2019-12-05 ENCOUNTER — Telehealth: Payer: Self-pay | Admitting: Gastroenterology

## 2019-12-05 NOTE — Telephone Encounter (Signed)
LVM for pt to return my call.

## 2019-12-05 NOTE — Telephone Encounter (Signed)
I tried to PA with the insurance we had on file it said member not found. Can you help this afternoon

## 2019-12-05 NOTE — Telephone Encounter (Signed)
PATIENT CALLED AGAIN & STATES HE IS COMPLETLEY OUT OF linaclotide (LINZESS) 145 MCG CAPS capsule called in to the [facility] [address]. He is completely out & states it needs to be prior approved.

## 2019-12-05 NOTE — Telephone Encounter (Signed)
Pt advised prior authorization was completed on Feb 8th. Still pending approval. Samples given to pt.

## 2019-12-05 NOTE — Telephone Encounter (Signed)
Yes, I will take care of it. Thank you!

## 2019-12-10 ENCOUNTER — Other Ambulatory Visit: Payer: Self-pay

## 2019-12-10 ENCOUNTER — Ambulatory Visit
Admission: RE | Admit: 2019-12-10 | Discharge: 2019-12-10 | Disposition: A | Discharge status: Home / Self Care | Payer: Managed Care, Other (non HMO) | Attending: Urology | Admitting: Urology

## 2019-12-10 ENCOUNTER — Ambulatory Visit: Payer: Managed Care, Other (non HMO) | Admitting: Gastroenterology

## 2019-12-10 ENCOUNTER — Ambulatory Visit
Admission: RE | Admit: 2019-12-10 | Discharge: 2019-12-10 | Disposition: A | Discharge status: Home / Self Care | Payer: Managed Care, Other (non HMO) | Source: Ambulatory Visit | Attending: Urology | Admitting: Urology

## 2019-12-10 DIAGNOSIS — N200 Calculus of kidney: Secondary | ICD-10-CM

## 2019-12-11 ENCOUNTER — Telehealth: Payer: Self-pay

## 2019-12-11 LAB — COMPREHENSIVE METABOLIC PANEL
ALT: 15 IU/L (ref 0–44)
AST: 22 IU/L (ref 0–40)
Albumin/Globulin Ratio: 2.1 (ref 1.2–2.2)
Albumin: 4.2 g/dL (ref 4.0–5.0)
Alkaline Phosphatase: 52 IU/L (ref 39–117)
BUN/Creatinine Ratio: 8 — ABNORMAL LOW (ref 9–20)
BUN: 10 mg/dL (ref 6–24)
Bilirubin Total: 0.5 mg/dL (ref 0.0–1.2)
CO2: 23 mmol/L (ref 20–29)
Calcium: 9.7 mg/dL (ref 8.7–10.2)
Chloride: 109 mmol/L — ABNORMAL HIGH (ref 96–106)
Creatinine, Ser: 1.26 mg/dL (ref 0.76–1.27)
GFR calc Af Amer: 79 mL/min/{1.73_m2} (ref >59)
GFR calc non Af Amer: 68 mL/min/{1.73_m2} (ref >59)
Globulin, Total: 2 g/dL (ref 1.5–4.5)
Glucose: 78 mg/dL (ref 65–99)
Potassium: 4.3 mmol/L (ref 3.5–5.2)
Sodium: 143 mmol/L (ref 134–144)
Total Protein: 6.2 g/dL (ref 6.0–8.5)

## 2019-12-11 LAB — LIPID PANEL
Chol/HDL Ratio: 3.7 ratio (ref 0.0–5.0)
Cholesterol, Total: 139 mg/dL (ref 100–199)
HDL: 38 mg/dL — ABNORMAL LOW (ref >39)
LDL Chol Calc (NIH): 76 mg/dL (ref 0–99)
Triglycerides: 140 mg/dL (ref 0–149)
VLDL Cholesterol Cal: 25 mg/dL (ref 5–40)

## 2019-12-11 LAB — HEMOGLOBIN A1C
Est. average glucose Bld gHb Est-mCnc: 103 mg/dL
Hgb A1c MFr Bld: 5.2 % (ref 4.8–5.6)

## 2019-12-11 NOTE — Telephone Encounter (Signed)
Pt advised.   Thanks,   -Ilamae Geng  

## 2019-12-11 NOTE — Telephone Encounter (Signed)
-----   Message from Virginia Crews, MD sent at 12/11/2019  4:31 PM EST ----- Normal labs

## 2019-12-13 ENCOUNTER — Telehealth: Payer: Self-pay | Admitting: *Deleted

## 2019-12-13 ENCOUNTER — Other Ambulatory Visit: Payer: Self-pay | Admitting: *Deleted

## 2019-12-13 ENCOUNTER — Telehealth: Payer: Self-pay

## 2019-12-13 DIAGNOSIS — N200 Calculus of kidney: Secondary | ICD-10-CM

## 2019-12-13 MED ORDER — LUBIPROSTONE 24 MCG PO CAPS: 24.0000 ug | ORAL_CAPSULE | Freq: Two times a day (BID) | ORAL | 3 refills | Status: DC

## 2019-12-13 NOTE — Telephone Encounter (Signed)
Called and left a detail message to inform patient that we have called in this medication for him and have sent it to the pharmacy

## 2019-12-13 NOTE — Telephone Encounter (Signed)
Notified patient as instructed, patient pleased. Discussed follow-up appointments, patient agrees  

## 2019-12-13 NOTE — Addendum Note (Signed)
Addended by: Ulyess Blossom L on: 12/13/2019 12:52 PM   Modules accepted: Orders

## 2019-12-13 NOTE — Telephone Encounter (Signed)
okay, let us try Amitiza 24 MCG 2 times a day, he can pick up samples if you would like  RV

## 2019-12-13 NOTE — Telephone Encounter (Signed)
Patient insurance company did not approved the [place]. They state that he has to try Amitiza first before they will cover medication.

## 2019-12-13 NOTE — Telephone Encounter (Signed)
-----   Message from Abbie Sons, MD sent at 12/12/2019  8:25 PM EST ----- KUB shows a stable 58mm left upper pole renal calculus. There may be a smaller stone.  F/U 1 yr with  KUB

## 2019-12-17 ENCOUNTER — Telehealth: Payer: Self-pay

## 2019-12-17 NOTE — Telephone Encounter (Signed)
Sent PA through cover my meds

## 2019-12-17 NOTE — Telephone Encounter (Signed)
Patient is calling because he states his pharmacy needs a PA on patient Troy [name] 7mg. Advised patient I would do PA on medication and he could come by and pick up samples

## 2020-01-15 ENCOUNTER — Encounter: Payer: Self-pay | Admitting: Family Medicine

## 2020-01-15 MED ORDER — BACITRACIN ZINC 500 UNIT/GM EX OINT: 1.0000 "application " | TOPICAL_OINTMENT | Freq: Two times a day (BID) | CUTANEOUS | 0 refills | Status: DC

## 2020-01-16 ENCOUNTER — Other Ambulatory Visit: Payer: Self-pay | Admitting: Family Medicine

## 2020-01-22 ENCOUNTER — Ambulatory Visit: Payer: Managed Care, Other (non HMO) | Admitting: Family Medicine

## 2020-01-22 ENCOUNTER — Emergency Department
Admission: EM | Admit: 2020-01-22 | Discharge: 2020-01-22 | Disposition: A | Discharge status: Home / Self Care | Payer: Managed Care, Other (non HMO) | Attending: Emergency Medicine | Admitting: Emergency Medicine

## 2020-01-22 ENCOUNTER — Other Ambulatory Visit: Payer: Self-pay

## 2020-01-22 ENCOUNTER — Ambulatory Visit: Payer: Self-pay | Admitting: *Deleted

## 2020-01-22 DIAGNOSIS — E119 Type 2 diabetes mellitus without complications: Secondary | ICD-10-CM | POA: Diagnosis not present

## 2020-01-22 DIAGNOSIS — R109 Unspecified abdominal pain: Secondary | ICD-10-CM | POA: Diagnosis present

## 2020-01-22 DIAGNOSIS — Z79899 Other long term (current) drug therapy: Secondary | ICD-10-CM | POA: Diagnosis not present

## 2020-01-22 DIAGNOSIS — R112 Nausea with vomiting, unspecified: Secondary | ICD-10-CM | POA: Insufficient documentation

## 2020-01-22 DIAGNOSIS — B20 Human immunodeficiency virus [HIV] disease: Secondary | ICD-10-CM | POA: Diagnosis not present

## 2020-01-22 DIAGNOSIS — Z87891 Personal history of nicotine dependence: Secondary | ICD-10-CM | POA: Insufficient documentation

## 2020-01-22 DIAGNOSIS — Z9884 Bariatric surgery status: Secondary | ICD-10-CM | POA: Diagnosis not present

## 2020-01-22 LAB — CBC
HCT: 43.6 % (ref 39.0–52.0)
Hemoglobin: 14.9 g/dL (ref 13.0–17.0)
MCH: 30.4 pg (ref 26.0–34.0)
MCHC: 34.2 g/dL (ref 30.0–36.0)
MCV: 89 fL (ref 80.0–100.0)
Platelets: 186 10*3/uL (ref 150–400)
RBC: 4.9 MIL/uL (ref 4.22–5.81)
RDW: 12.3 % (ref 11.5–15.5)
WBC: 7.4 10*3/uL (ref 4.0–10.5)
nRBC: 0 % (ref 0.0–0.2)

## 2020-01-22 LAB — COMPREHENSIVE METABOLIC PANEL
ALT: 15 U/L (ref 0–44)
AST: 22 U/L (ref 15–41)
Albumin: 4.5 g/dL (ref 3.5–5.0)
Alkaline Phosphatase: 44 U/L (ref 38–126)
Anion gap: 7 (ref 5–15)
BUN: 9 mg/dL (ref 6–20)
CO2: 25 mmol/L (ref 22–32)
Calcium: 9.4 mg/dL (ref 8.9–10.3)
Chloride: 110 mmol/L (ref 98–111)
Creatinine, Ser: 1.2 mg/dL (ref 0.61–1.24)
GFR calc Af Amer: >60 mL/min (ref >60)
GFR calc non Af Amer: >60 mL/min (ref >60)
Glucose, Bld: 93 mg/dL (ref 70–99)
Potassium: 3.9 mmol/L (ref 3.5–5.1)
Sodium: 142 mmol/L (ref 135–145)
Total Bilirubin: 0.8 mg/dL (ref 0.3–1.2)
Total Protein: 7.2 g/dL (ref 6.5–8.1)

## 2020-01-22 LAB — LIPASE, BLOOD: Lipase: 29 U/L (ref 11–51)

## 2020-01-22 MED ORDER — ONDANSETRON 4 MG PO TBDP: 4.0000 mg | ORAL_TABLET | Freq: Three times a day (TID) | ORAL | 0 refills | Status: DC | PRN

## 2020-01-22 MED ORDER — ONDANSETRON 4 MG PO TBDP
8.0000 mg | ORAL_TABLET | Freq: Once | ORAL | Status: AC
  Administered 2020-01-22: 8 mg via ORAL
  Filled 2020-01-22: qty 2

## 2020-01-22 NOTE — ED Triage Notes (Signed)
Pt comes POV with n/v for 5 days. PCP recommended coming here to get labs checked and get fluids. 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

## 2020-01-22 NOTE — ED Provider Notes (Signed)
[facility] Emergency Department Provider Note   ____________________________________________    I have reviewed the triage vital signs and the nursing notes.   HISTORY  Chief Complaint Abdominal Pain, Nausea, and Emesis     HPI Troy [name] is a 49 y.o. male with a history of diabetes, HIV who presents with complaints of decreased p.o. intake with nausea and vomiting.  Patient reports he had a gastric sleeve procedure 8 years ago and every few months he has a period of time where he has cramping in his upper abdomen with nausea and vomiting.  Typically this improves with antiemetics however when he takes a Zofran tablet it makes him nauseated and he vomits.  He is tolerating liquids without difficulty.  He presents to make sure that he is not dehydrated.  No fevers or chills  Past Medical History:  Diagnosis Date  . Asthma   . Bipolar 1 disorder ([place])   . Diabetes mellitus without complication ([place])   . History of CVA with residual deficit   . HIV infection ([place])   . Nephrolithiasis     Patient Active Problem List   Diagnosis Date Noted  . Lower urinary tract symptoms (LUTS) 11/26/2019  . Gastroesophageal reflux disease with esophagitis without hemorrhage   . Chronic constipation 09/03/2019  . Nephrolithiasis 06/14/2019  . Urinary urgency 06/14/2019  . Allergic rhinitis 02/13/2019  . Asthma 02/11/2019  . Dyslipidemia 02/11/2019  . HIV infection ([place]) 02/11/2019  . Kidney stones, calcium oxalate 02/11/2019  . History of CVA with residual deficit   . Bipolar 1 disorder ([place])   . Overweight 01/13/2015  . 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 S/P bariatric surgery 06/20/2013  . Insomnia 12/09/2011  . Migraine headache 09/28/2011    Past Surgical History:  Procedure Laterality Date  . COLONOSCOPY    . COLONOSCOPY WITH PROPOFOL N/A 05/30/2019   Procedure: COLONOSCOPY WITH PROPOFOL;  Surgeon: Lin Landsman, MD;  Location: [facility]  ENDOSCOPY;  Service: Gastroenterology;  Laterality: N/A;  . ELBOW SURGERY Left    fracture  . ESOPHAGOGASTRODUODENOSCOPY (EGD) WITH PROPOFOL N/A 05/30/2019   Procedure: ESOPHAGOGASTRODUODENOSCOPY (EGD) WITH PROPOFOL;  Surgeon: Lin Landsman, MD;  Location: [facility];  Service: Gastroenterology;  Laterality: N/A;  . ESOPHAGOGASTRODUODENOSCOPY (EGD) WITH PROPOFOL N/A 09/12/2019   Procedure: ESOPHAGOGASTRODUODENOSCOPY (EGD) WITH PROPOFOL;  Surgeon: Lin Landsman, MD;  Location: [facility] ENDOSCOPY;  Service: Gastroenterology;  Laterality: N/A;  . FACIAL COSMETIC SURGERY    . GASTRIC BYPASS    . HERNIA REPAIR    . KIDNEY STONE SURGERY    . renal artery repair      Prior to Admission medications   Medication Sig Start Date End Date Taking? Authorizing Provider  albuterol (VENTOLIN HFA) 108 (90 Base) MCG/ACT inhaler Inhale 1-2 puffs into the lungs every 6 (six) hours as needed for wheezing or shortness of breath. 05/24/19   Bacigalupo, Dionne Bucy, MD  ALPRAZolam Duanne Moron) 1 MG tablet Take 1 mg by mouth daily as needed. 08/07/19   [provider]  ARIPiprazole (ABILIFY) 10 MG tablet Take 10 mg by mouth daily.    [provider]  bacitracin ointment Apply 1 application topically 2 (two) times daily. 01/15/20   Bacigalupo, Dionne Bucy, MD  busPIRone (BUSPAR) 30 MG tablet Take 30 mg by mouth 2 (two) times daily.    [provider]  cetirizine (ZYRTEC) 10 MG tablet Take 1 tablet (10 mg total) by mouth daily. 05/24/19   Bacigalupo,  Dionne Bucy, MD  Doxepin HCl (SILENOR) 6 MG TABS Take 6 mg by mouth daily.    [provider]  fluticasone (FLONASE) 50 MCG/ACT nasal spray Place 2 sprays into both nostrils daily. 02/11/19   Virginia Crews, MD  linaclotide [facility]) 145 MCG CAPS capsule Take 1 capsule (145 mcg total) by mouth daily before breakfast. 09/09/19 12/08/19  Vanga, Tally Due, MD  lubiprostone (AMITIZA) 24 MCG capsule Take 1 capsule (24 mcg total) by mouth 2  (two) times daily with a meal. 12/13/19   Vanga, Tally Due, MD  montelukast (SINGULAIR) 10 MG tablet Take 1 tablet (10 mg total) by mouth daily. 05/24/19   Virginia Crews, MD  ODEFSEY 200-25-25 MG TABS tablet  02/04/19   [provider]  omeprazole (PRILOSEC) 40 MG capsule Take 1 capsule (40 mg total) by mouth daily before breakfast. 09/12/19 10/12/19  Lin Landsman, MD  ondansetron (ZOFRAN ODT) 4 MG disintegrating tablet Take 1 tablet (4 mg total) by mouth every 8 (eight) hours as needed. 01/22/20   Lavonia Drafts, MD  ondansetron (ZOFRAN) 8 MG tablet Take 1 tablet (8 mg total) by mouth every 6 (six) hours as needed for nausea or vomiting. 08/14/19   Jerrol Banana., MD  SUMAtriptan (IMITREX) 100 MG tablet TAKE ONE TABLET BY MOUTH AT ONSET OF THE HEADACHE. MAY REPEAT IN TWO HOURS 05/24/19   Virginia Crews, MD  tamsulosin (FLOMAX) 0.4 MG CAPS capsule Take 1 capsule (0.4 mg total) by mouth daily. 08/05/19   Stoioff, Ronda Fairly, MD  tiZANidine (ZANAFLEX) 4 MG capsule TAKE 1 CAPSULE BY MOUTH 2 TIMES DAILY. 12/02/19   Virginia Crews, MD  topiramate (TOPAMAX) 100 MG tablet Take 1 tablet (100 mg total) by mouth 2 (two) times daily. 05/24/19   Virginia Crews, MD  traZODone (DESYREL) 100 MG tablet Take 100 mg by mouth 2 (two) times a day. 06/08/15   [provider]     Allergies Niacin and Orange oil  Family History  Problem Relation Age of Onset  . COPD Mother   . Diabetes Mother   . Hypertension Mother   . Hyperlipidemia Mother   . Healthy Father   . Cancer Father        skin  . Stroke Brother   . Heart disease Brother   . Seizures Brother   . Thyroid disease Brother   . Cancer Maternal Grandmother        breast  . Hyperlipidemia Maternal Grandmother   . Hypertension Maternal Grandmother   . Diabetes Maternal Grandfather   . Hyperlipidemia Maternal Grandfather   . Heart disease Paternal Grandfather   . Hypertension Paternal Grandfather      Social History Social History   Tobacco Use  . Smoking status: Former Smoker    Packs/day: 1.00    Years: 25.00    Pack years: 25.00    Types: Cigarettes    Quit date: 2013    Years since quitting: 8.2  . Smokeless tobacco: Never Used  Substance Use Topics  . 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  ENT: No sore throat. Cardiovascular: Denies chest pain. Respiratory: Denies shortness of breath. Gastrointestinal: As above Genitourinary: Negative for dysuria. Musculoskeletal: Negative for back pain. Skin: Negative  for rash. Neurological: Negative for headaches    ____________________________________________   PHYSICAL EXAM:  VITAL SIGNS: ED Triage Vitals  Enc Vitals Group     BP 01/22/20 1105 127/79     Pulse Rate 01/22/20 1102 63     Resp 01/22/20 1102 18     Temp 01/22/20 1102 98.5 F (36.9 C)     Temp Source 01/22/20 1102 Oral     SpO2 01/22/20 1102 99 %     Weight 01/22/20 1103 73.5 kg (162 lb)     Height 01/22/20 1103 1.753 m (5' 9" )     Head Circumference --      Peak Flow --      Pain Score 01/22/20 1103 7     Pain Loc --      Pain Edu? --      Excl. in [place]? --     Constitutional: Alert and oriented.   Nose: No congestion/rhinnorhea. Mouth/Throat: Mucous membranes are moist.    Cardiovascular: Normal rate, regular rhythm. Grossly normal heart sounds.  Good peripheral circulation. Respiratory: Normal respiratory effort.  No retractions. Lungs CTAB. Gastrointestinal: Soft and nontender. No distention.  No CVA tenderness.  Reassuring exam  Musculoskeletal: No lower extremity tenderness nor edema.  Warm and well perfused Neurologic:  Normal speech and language. No gross focal neurologic deficits are appreciated.  Skin:  Skin is warm, dry and intact. No  rash noted. Psychiatric: Mood and affect are normal. Speech and behavior are normal.  ____________________________________________   LABS (all labs ordered are listed, but only abnormal results are displayed)  Labs Reviewed  LIPASE, BLOOD  COMPREHENSIVE METABOLIC PANEL  CBC  URINALYSIS, COMPLETE (UACMP) WITH MICROSCOPIC   ____________________________________________  EKG  ED ECG REPORT I, Lavonia Drafts, the attending physician, personally viewed and interpreted this ECG.  Date: 01/22/2020  Rhythm: Sinus bradycardia QRS Axis: normal Intervals: normal ST/T Wave abnormalities: normal Narrative Interpretation: no evidence of acute ischemia  ____________________________________________  RADIOLOGY  None ____________________________________________   PROCEDURES  Procedure(s) performed: No  Procedures   Critical Care performed: No ____________________________________________   INITIAL IMPRESSION / ASSESSMENT AND PLAN / ED COURSE  Pertinent labs & imaging results that were available during my care of the patient were reviewed by me and considered in my medical decision making (see chart for details).  Patient well-appearing and in no acute distress. abdominal exam is reassuring.  Lab work is overall quite reassuring, no evidence of dehydration, electrolytes within normal limits.      Patient feels he needs a dissolvable nausea medication, will give 8 mg of ODT Zofran.  He feels confident this will help him through this episode.  He knows he can return at any time.  Appropriate for discharge at this time ____________________________________________   FINAL CLINICAL IMPRESSION(S) / ED DIAGNOSES  Final diagnoses:  Nausea and vomiting, intractability of vomiting not specified, unspecified vomiting type        Note:  This document was prepared using Dragon voice recognition software and may include unintentional dictation errors.   Lavonia Drafts, MD 01/22/20  1406

## 2020-01-22 NOTE — Telephone Encounter (Signed)
Patient called with complaints- but disconnected with agent.  Reason for Disposition . Patient sounds very sick or weak to the triager    Gastric bypass history, vomiting 4-5 days- unable to eat solids- only drinking Dr Malachi Bonds, unable to keep medication down,weak- advised ED- may need IV supplimentation  Answer Assessment - Initial Assessment Questions 1. VOMITING SEVERITY: "How many times have you vomited in the past 24 hours?"     - MILD:  1 - 2 times/day    - MODERATE: 3 - 5 times/day, decreased oral intake without significant weight loss or symptoms of dehydration    - SEVERE: 6 or more times/day, vomits everything or nearly everything, with significant weight loss, symptoms of dehydration      moderate 2. ONSET: "When did the vomiting begin?"      5 days 3. FLUIDS: "What fluids or food have you vomited up today?" "Have you been able to keep any fluids down?"     So far- pop tart, crackers, keeping fluids down- not food 4. ABDOMINAL PAIN: "Are your having any abdominal pain?" If yes : "How bad is it and what does it feel like?" (e.g., crampy, dull, intermittent, constant)      no 5. DIARRHEA: "Is there any diarrhea?" If so, ask: "How many times today?"      Some- not out of the norm 6. CONTACTS: "Is there anyone else in the family with the same symptoms?"      Went to [place] for funeral 7. CAUSE: "What do you think is causing your vomiting?"     unknown 8. HYDRATION STATUS: "Any signs of dehydration?" (e.g., dry mouth [not only dry lips], too weak to stand) "When did you last urinate?"     Weakness, this morning 9. OTHER SYMPTOMS: "Do you have any other symptoms?" (e.g., fever, headache, vertigo, vomiting blood or coffee grounds, recent head injury)     no 10. PREGNANCY: "Is there any chance you are pregnant?" "When was your last menstrual period?"       n/a  Protocols used: [facility]

## 2020-03-19 ENCOUNTER — Other Ambulatory Visit: Payer: Self-pay | Admitting: Family Medicine

## 2020-03-22 ENCOUNTER — Other Ambulatory Visit: Payer: Self-pay | Admitting: Gastroenterology

## 2020-04-06 ENCOUNTER — Other Ambulatory Visit: Payer: Self-pay | Admitting: Family Medicine

## 2020-05-20 ENCOUNTER — Ambulatory Visit: Payer: Self-pay | Admitting: Family Medicine

## 2020-05-20 NOTE — Telephone Encounter (Signed)
Pt reports "Almost passed out yesterday from low BS." States he was diabetic in past, had gastric sleeve, lost weight, "No longer diabetic." States does not check BS, "Haven't for 8-9 years." DOes not check BP at home. 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 States ate "Beef jerky" and felt better. Denies any other symptoms. No symptoms presently. Agent had made appt for tomorrow prior to triage. Advised UC if symptoms reoccur, have driver.  Pt verbalizes understanding.  Reason for Disposition . [1] Blood glucose < 70  mg/dL (3.9 mmol/L) or symptomatic, now improved with Care Advice AND [2] cause unknown  Answer Assessment - Initial Assessment Questions 1. SYMPTOMS: "What symptoms are you concerned about?"     Blood sugar dropped yesterday, Does not check BS. See summary 2. ONSET:  "When did the symptoms start?"     *No Answer* 3. BLOOD GLUCOSE: "What is your blood glucose level?"      *No Answer* 4. USUAL RANGE: "What is your blood glucose level usually?" (e.g., usual fasting morning value, usual evening value)     *No Answer* 5. TYPE 1 or 2:  "Do you know what type of diabetes you have?"  (e.g., Type 1, Type 2, Gestational; doesn't know)      *No Answer* 6. INSULIN: "Do you take insulin?" "What type of insulin(s) do you use? What is the mode of delivery? (syringe, pen; injection or pump) "When did you last give yourself an insulin dose?" (i.e., time or hours/minutes ago) "How much did you give?" (i.e., how many units)     *No Answer* 7. DIABETES PILLS: "Do you take any pills for your diabetes?"     *No Answer* 8. OTHER SYMPTOMS: "Do you have any symptoms?" (e.g., fever, frequent urination, difficulty breathing, vomiting)     *No Answer* 9. LOW BLOOD GLUCOSE TREATMENT: "What have you done so far to treat the low blood glucose level?"     *No Answer* 10. FOOD: "When did you last eat or drink?"        *No Answer* 11. ALONE: "Are you alone right now or is someone with you?"        *No Answer*  Protocols used: DIABETES - LOW BLOOD SUGAR-A-AH

## 2020-05-21 ENCOUNTER — Other Ambulatory Visit: Payer: Self-pay

## 2020-05-21 ENCOUNTER — Encounter: Payer: Self-pay | Admitting: Physician Assistant

## 2020-05-21 ENCOUNTER — Ambulatory Visit (INDEPENDENT_AMBULATORY_CARE_PROVIDER_SITE_OTHER): Payer: Managed Care, Other (non HMO) | Admitting: Physician Assistant

## 2020-05-21 VITALS — BP 133/84 | HR 74 | Temp 98.8°F | Wt 165.0 lb

## 2020-05-21 DIAGNOSIS — E162 Hypoglycemia, unspecified: Secondary | ICD-10-CM | POA: Diagnosis not present

## 2020-05-21 LAB — POCT GLYCOSYLATED HEMOGLOBIN (HGB A1C): Hemoglobin A1C: 5.1 % (ref 4.0–5.6)

## 2020-05-21 NOTE — Progress Notes (Signed)
Established patient visit   Patient: Troy [name]   DOB: 07/19/1971   49 y.o. Male  MRN: [id number] Visit Date: 05/21/2020  Today's healthcare provider: Mar Daring, PA-C   Chief Complaint  Patient presents with  . Hypoglycemia   Mertie Moores as a scribe for [employer], PA-C.,have documented all relevant documentation on the behalf of Mar Daring, PA-C,as directed by  Mar Daring, PA-C while in the presence of Mar Daring, PA-C.  Subjective    HPI  Near syncope episode: Patient states "Almost passed out 2 days ago from low BS." Patient says that he was diabetic in past, had gastric sleeve, lost weight, "No longer diabetic." He does not check blood sugars, "Haven't for 8-9 years." He does not check Blood pressure at home. States he was staying hydrated and works in [employer], occupational. He says that he felt like he was going to faint, sweating, lasted 10 minutes. States he ate "Beef jerky" and felt better. Denies any other symptoms. No symptoms presently.  Patient Active Problem List   Diagnosis Date Noted  . Lower urinary tract symptoms (LUTS) 11/26/2019  . Gastroesophageal reflux disease with esophagitis without hemorrhage   . Chronic constipation 09/03/2019  . Nephrolithiasis 06/14/2019  . Urinary urgency 06/14/2019  . Allergic rhinitis 02/13/2019  . Asthma 02/11/2019  . Dyslipidemia 02/11/2019  . HIV infection ([place]) 02/11/2019  . Kidney stones, calcium oxalate 02/11/2019  . History of CVA with residual deficit   . Bipolar 1 disorder ([place])   . Overweight 01/13/2015  . 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 S/P bariatric surgery 06/20/2013  . Insomnia 12/09/2011  . Migraine headache 09/28/2011   Past Medical History:  Diagnosis Date  . Asthma   . Bipolar 1 disorder ([place])   . Diabetes mellitus without complication ([place])   . History of CVA with residual deficit   . HIV infection ([place])   . Nephrolithiasis         Medications: Outpatient Medications Prior to Visit  Medication Sig  . albuterol (VENTOLIN HFA) 108 (90 Base) MCG/ACT inhaler Inhale 1-2 puffs into the lungs every 6 (six) hours as needed for wheezing or shortness of breath.  . ALPRAZolam (XANAX) 1 MG tablet Take 1 mg by mouth daily as needed.  . ARIPiprazole (ABILIFY) 10 MG tablet Take 10 mg by mouth daily.  . busPIRone (BUSPAR) 30 MG tablet Take 30 mg by mouth 2 (two) times daily.  . Doxepin HCl (SILENOR) 6 MG TABS Take 6 mg by mouth daily.  Marland Kitchen lubiprostone (AMITIZA) 24 MCG capsule TAKE 1 CAPSULE (24 MCG TOTAL) BY MOUTH 2 (TWO) TIMES DAILY WITH A MEAL.  Marland Kitchen ODEFSEY 200-25-25 MG TABS tablet   . omeprazole (PRILOSEC) 40 MG capsule TAKE 1 CAPSULE BY MOUTH TWICE A DAY *MAX PER INSURANCE  . ondansetron (ZOFRAN ODT) 4 MG disintegrating tablet Take 1 tablet (4 mg total) by mouth every 8 (eight) hours as needed.  . ondansetron (ZOFRAN) 8 MG tablet Take 1 tablet (8 mg total) by mouth every 6 (six) hours as needed for nausea or vomiting.  . SUMAtriptan (IMITREX) 100 MG tablet TAKE ONE TABLET BY MOUTH AT ONSET OF THE HEADACHE. MAY REPEAT IN TWO HOURS  . tamsulosin (FLOMAX) 0.4 MG CAPS capsule Take 1 capsule (0.4 mg total) by mouth daily.  Marland Kitchen tiZANidine (ZANAFLEX) 4 MG capsule TAKE 1 CAPSULE BY MOUTH 2 TIMES DAILY.  Marland Kitchen topiramate (TOPAMAX) 100 MG tablet Take 1 tablet (100  mg total) by mouth 2 (two) times daily.  . traZODone (DESYREL) 100 MG tablet Take 100 mg by mouth 2 (two) times a day.  . [DISCONTINUED] cetirizine (ZYRTEC) 10 MG tablet Take 1 tablet (10 mg total) by mouth daily.  . [DISCONTINUED] montelukast (SINGULAIR) 10 MG tablet Take 1 tablet (10 mg total) by mouth daily.  . [DISCONTINUED] bacitracin ointment Apply 1 application topically 2 (two) times daily.  . [DISCONTINUED] fluticasone (FLONASE) 50 MCG/ACT nasal spray Place 2 sprays into both nostrils daily.  . [DISCONTINUED] linaclotide (LINZESS) 145 MCG CAPS capsule Take 1 capsule (145 mcg  total) by mouth daily before breakfast.   No facility-administered medications prior to visit.    Review of Systems  Constitutional: Negative.   Respiratory: Negative.   Cardiovascular: Negative.   Musculoskeletal: Negative.   Neurological: Negative.     Last CBC Lab Results  Component Value Date   WBC 7.4 01/22/2020   HGB 14.9 01/22/2020   HCT 43.6 01/22/2020   MCV 89.0 01/22/2020   MCH 30.4 01/22/2020   RDW 12.3 01/22/2020   PLT 186 76/22/6333   Last metabolic panel Lab Results  Component Value Date   GLUCOSE 93 01/22/2020   NA 142 01/22/2020   K 3.9 01/22/2020   CL 110 01/22/2020   CO2 25 01/22/2020   BUN 9 01/22/2020   CREATININE 1.20 01/22/2020   GFRNONAA >60 01/22/2020   GFRAA >60 01/22/2020   CALCIUM 9.4 01/22/2020   PROT 7.2 01/22/2020   ALBUMIN 4.5 01/22/2020   LABGLOB 2.0 12/10/2019   AGRATIO 2.1 12/10/2019   BILITOT 0.8 01/22/2020   ALKPHOS 44 01/22/2020   AST 22 01/22/2020   ALT 15 01/22/2020   ANIONGAP 7 01/22/2020      Objective    BP (!) 133/84 (BP Location: Right Arm, Patient Position: Sitting, Cuff Size: Normal)   Pulse 74   Temp 98.8 F (37.1 C) (Temporal)   Wt 165 lb (74.8 kg)   BMI 24.37 kg/m  BP Readings from Last 3 Encounters:  05/21/20 (!) 133/84  01/22/20 127/79  11/26/19 118/66   Wt Readings from Last 3 Encounters:  05/21/20 165 lb (74.8 kg)  01/22/20 162 lb (73.5 kg)  11/26/19 177 lb (80.3 kg)      Physical Exam Vitals reviewed.  Constitutional:      General: He is not in acute distress.    Appearance: Normal appearance. He is well-developed and normal weight. He is not diaphoretic.  HENT:     Head: Normocephalic and atraumatic.  Eyes:     General: No scleral icterus. Cardiovascular:     Rate and Rhythm: Normal rate and regular rhythm.     Heart sounds: Normal heart sounds. No murmur heard.  No friction rub. No gallop.   Pulmonary:     Effort: Pulmonary effort is normal. No respiratory distress.     Breath  sounds: Normal breath sounds. No wheezing or rales.  Musculoskeletal:     Cervical back: Normal range of motion and neck supple.  Neurological:     Mental Status: He is alert.  Psychiatric:        Mood and Affect: Mood normal.        Thought Content: Thought content normal.      Results for orders placed or performed in visit on 05/21/20  POCT HgB A1C  Result Value Ref Range   Hemoglobin A1C 5.1 4.0 - 5.6 %    Assessment & Plan     1. Hypoglycemia  Discussed carrying healthy snacks to prevent low blood sugars. Stay hydrated. Call if hypoglycemic episodes continue as he may require endocrinology referral.    Return if symptoms worsen or fail to improve.      Reynolds Bowl, PA-C, have reviewed all documentation for this visit. The documentation on 05/26/20 for the exam, diagnosis, procedures, and orders are all accurate and complete.   [place]ye [place]  [facility] [phone] (phone) [phone] (fax)  [place]

## 2020-05-21 NOTE — Patient Instructions (Signed)
Hypoglycemia Hypoglycemia occurs when the level of sugar (glucose) in the blood is too low. Hypoglycemia can happen in people who do or do not have diabetes. It can develop quickly, and it can be a medical emergency. For most people with diabetes, a blood glucose level below 70 mg/dL (3.9 mmol/L) is considered hypoglycemia. Glucose is a type of sugar that provides the body's main source of energy. Certain hormones (insulin and glucagon) control the level of glucose in the blood. Insulin lowers blood glucose, and glucagon raises blood glucose. Hypoglycemia can result from having too much insulin in the bloodstream, or from not eating enough food that contains glucose. You may also have reactive hypoglycemia, which happens within 4 hours after eating a meal. What are the causes? Hypoglycemia occurs most often in people who have diabetes and may be caused by:  Diabetes medicine.  Not eating enough, or not eating often enough.  Increased physical activity.  Drinking alcohol on an empty stomach. If you do not have diabetes, hypoglycemia may be caused by:  A tumor in the pancreas.  Not eating enough, or not eating for long periods at a time (fasting).  A severe infection or illness.  Certain medicines. What increases the risk? Hypoglycemia is more likely to develop in:  People who have diabetes and take medicines to lower blood glucose.  People who abuse alcohol.  People who have a severe illness. What are the signs or symptoms? Mild symptoms Mild hypoglycemia may not cause any symptoms. If you do have symptoms, they may include:  Hunger.  Anxiety.  Sweating and feeling clammy.  Dizziness or feeling light-headed.  Sleepiness.  Nausea.  Increased heart rate.  Headache.  Blurry vision.  Irritability.  Tingling or numbness around the mouth, lips, or tongue.  A change in coordination.  Restless sleep. Moderate symptoms Moderate hypoglycemia can cause:  Mental  confusion and poor judgment.  Behavior changes.  Weakness.  Irregular heartbeat. Severe symptoms Severe hypoglycemia is a medical emergency. It can cause:  Fainting.  Seizures.  Loss of consciousness (coma).  Death. How is this diagnosed? Hypoglycemia is diagnosed with a blood test to measure your blood glucose level. This blood test is done while you are having symptoms. Your health care provider may also do a physical exam and review your medical history. How is this treated? This condition can often be treated by immediately eating or drinking something that contains sugar, such as:  Fruit juice, 4-6 oz (120-150 mL).  Regular soda (not diet soda), 4-6 oz (120-150 mL).  Low-fat milk, 4 oz (120 mL).  Several pieces of hard candy.  Sugar or honey, 1 Tbsp (15 mL). Treating hypoglycemia if you have diabetes If you are alert and able to swallow safely, follow the 15:15 rule:  Take 15 grams of a rapid-acting carbohydrate. Talk with your health care provider about how much you should take.  Rapid-acting options include: ? Glucose pills (take 15 grams). ? 6-8 pieces of hard candy. ? 4-6 oz (120-150 mL) of fruit juice. ? 4-6 oz (120-150 mL) of regular (not diet) soda. ? 1 Tbsp (15 mL) honey or sugar.  Check your blood glucose 15 minutes after you take the carbohydrate.  If the repeat blood glucose level is still at or below 70 mg/dL (3.9 mmol/L), take 15 grams of a carbohydrate again.  If your blood glucose level does not increase above 70 mg/dL (3.9 mmol/L) after 3 tries, seek emergency medical care.  After your blood glucose level returns   to normal, eat a meal or a snack within 1 hour.  Treating severe hypoglycemia Severe hypoglycemia is when your blood glucose level is at or below 54 mg/dL (3 mmol/L). Severe hypoglycemia is a medical emergency. Get medical help right away. If you have severe hypoglycemia and you cannot eat or drink, you may need an injection of  glucagon. A family member or close friend should learn how to check your blood glucose and how to give you a glucagon injection. Ask your health care provider if you need to have an emergency glucagon injection kit available. Severe hypoglycemia may need to be treated in a hospital. The treatment may include getting glucose through an IV. You may also need treatment for the cause of your hypoglycemia. Follow these instructions at home:  General instructions  Take over-the-counter and prescription medicines only as told by your health care provider.  Monitor your blood glucose as told by your health care provider.  Limit alcohol intake to no more than 1 drink a day for nonpregnant women and 2 drinks a day for men. One drink equals 12 oz of beer (355 mL), 5 oz of wine (148 mL), or 1 oz of hard liquor (44 mL).  Keep all follow-up visits as told by your health care provider. This is important. If you have diabetes:  Always have a rapid-acting carbohydrate snack with you to treat low blood glucose.  Follow your diabetes management plan as directed. Make sure you: ? Know the symptoms of hypoglycemia. It is important to treat it right away to prevent it from becoming severe. ? Take your medicines as directed. ? Follow your exercise plan. ? Follow your meal plan. Eat on time, and do not skip meals. ? Check your blood glucose as often as directed. Always check before and after exercise. ? Follow your sick day plan whenever you cannot eat or drink normally. Make this plan in advance with your health care provider.  Share your diabetes management plan with people in your workplace, school, and household.  Check your urine for ketones when you are ill and as told by your health care provider.  Carry a medical alert card or wear medical alert jewelry. Contact a health care provider if:  You have problems keeping your blood glucose in your target range.  You have frequent episodes of  hypoglycemia. Get help right away if:  You continue to have hypoglycemia symptoms after eating or drinking something containing glucose.  Your blood glucose is at or below 54 mg/dL (3 mmol/L).  You have a seizure.  You faint. These symptoms may represent a serious problem that is an emergency. Do not wait to see if the symptoms will go away. Get medical help right away. Call your local emergency services (911 in the [place] Summary  Hypoglycemia occurs when the level of sugar (glucose) in the blood is too low.  Hypoglycemia can happen in people who do or do not have diabetes. It can develop quickly, and it can be a medical emergency.  Make sure you know the symptoms of hypoglycemia and how to treat it.  Always have a rapid-acting carbohydrate snack with you to treat low blood sugar. This information is not intended to replace advice given to you by your health care provider. Make sure you discuss any questions you have with your health care provider. Document Revised: 04/02/2018 Document Reviewed: 11/13/2015 Elsevier Patient Education  2020 [employer]  

## 2020-05-25 ENCOUNTER — Other Ambulatory Visit: Payer: Self-pay | Admitting: Family Medicine

## 2020-05-26 ENCOUNTER — Encounter: Payer: Managed Care, Other (non HMO) | Admitting: Family Medicine

## 2020-05-26 ENCOUNTER — Other Ambulatory Visit: Payer: Self-pay | Admitting: Family Medicine

## 2020-06-02 ENCOUNTER — Other Ambulatory Visit: Payer: Self-pay | Admitting: Family Medicine

## 2020-06-02 NOTE — Telephone Encounter (Signed)
Requested medications are due for refill today?  Yes - This refill cannot be delegated.    Requested medications are on active medication list?  Yes  Last Refill:  12/02/2019  # 180 with one refill  Future visit scheduled?  Yes  Notes to Clinic:  This refill cannot be delegated.

## 2020-06-23 ENCOUNTER — Other Ambulatory Visit: Payer: Self-pay | Admitting: Family Medicine

## 2020-06-23 NOTE — Telephone Encounter (Signed)
Requested medications are due for refill today?  Yes  - This medication refill cannot be delegated.    Requested medications are on active medication list?  Yes  Last Refill:   05/24/2019  # 180 with 3 refills   Future visit scheduled?  Yes  Notes to Clinic:  This medication refill cannot be delegated.

## 2020-07-28 ENCOUNTER — Other Ambulatory Visit: Payer: Self-pay | Admitting: Urology

## 2020-07-28 DIAGNOSIS — R350 Frequency of micturition: Secondary | ICD-10-CM

## 2020-07-28 DIAGNOSIS — R3915 Urgency of urination: Secondary | ICD-10-CM

## 2020-09-08 ENCOUNTER — Telehealth: Payer: Self-pay

## 2020-09-08 MED ORDER — LUBIPROSTONE 24 MCG PO CAPS: 24.0000 ug | ORAL_CAPSULE | Freq: Two times a day (BID) | ORAL | 0 refills | Status: DC

## 2020-09-08 NOTE — Telephone Encounter (Signed)
Insurance company will only cover brand name Amitiza sent brand name to the pharmacy

## 2020-09-10 ENCOUNTER — Other Ambulatory Visit: Payer: Self-pay

## 2020-09-10 ENCOUNTER — Encounter: Payer: Self-pay | Admitting: Adult Health

## 2020-09-10 ENCOUNTER — Ambulatory Visit (INDEPENDENT_AMBULATORY_CARE_PROVIDER_SITE_OTHER): Payer: Managed Care, Other (non HMO) | Admitting: Adult Health

## 2020-09-10 VITALS — BP 111/72 | HR 60 | Temp 97.9°F | Resp 16 | Wt 184.0 lb

## 2020-09-10 DIAGNOSIS — L0100 Impetigo, unspecified: Secondary | ICD-10-CM

## 2020-09-10 MED ORDER — MUPIROCIN 2 % EX OINT: 1.0000 "application " | TOPICAL_OINTMENT | Freq: Two times a day (BID) | CUTANEOUS | 0 refills | Status: DC

## 2020-09-10 MED ORDER — CEPHALEXIN 500 MG PO CAPS: 500.0000 mg | ORAL_CAPSULE | Freq: Two times a day (BID) | ORAL | 0 refills | Status: DC

## 2020-09-10 MED ORDER — SULFAMETHOXAZOLE-TRIMETHOPRIM 800-160 MG PO TABS: 1.0000 | ORAL_TABLET | Freq: Two times a day (BID) | ORAL | 0 refills | Status: DC

## 2020-09-10 NOTE — Patient Instructions (Signed)
Impetigo, Adult Impetigo is an infection of the skin. It commonly occurs in young children, but it can also occur in adults. The infection causes itchy blisters and sores that produce brownish-yellow fluid. As the fluid dries, it forms a thick, honey-colored crust. These skin changes usually occur on the face, but they can also affect other areas of the body. Impetigo usually goes away in 7-10 days with treatment. What are the causes? This condition is caused by two types of bacteria. It may be caused by staphylococci or streptococci bacteria. These bacteria cause impetigo when they get under the surface of the skin. This often happens after some damage to the skin, such as:  Cuts, scrapes, or scratches.  Rashes.  Insect bites, especially when you scratch the area of a bite.  Chickenpox or other illnesses that cause open skin sores.  Nail biting or chewing. Impetigo can spread easily from one person to another (is contagious). It may be spread through close skin contact or by sharing towels, clothing, or other items that an infected person has touched. What increases the risk? The following factors may make you more likely to develop this condition:  Playing sports that include skin-to-skin contact with others.  Having a skin condition with open sores, such as chickenpox.  Having diabetes.  Having a weak body defense system (immune system).  Having many skin cuts or scrapes.  Living in an area that has high humidity levels.  Having poor hygiene.  Having high levels of staphylococci in your nose. What are the signs or symptoms? The main symptom of this condition is small blisters, often on the face around the mouth and nose. In time, the blisters break open and turn into tiny sores (lesions) with a yellow crust. In some cases, the blisters cause itching or burning. With scratching, irritation, or lack of treatment, these small lesions may get larger. Other possible symptoms  include:  Larger blisters.  Pus.  Swollen lymph glands. Scratching the affected area can cause impetigo to spread to other parts of the body. The bacteria can get under the fingernails and spread when you touch another area of your skin. How is this diagnosed? This condition is usually diagnosed during a physical exam. A skin sample or a sample of fluid from a blister may be taken for lab tests that involve growing bacteria (culture test). Lab tests can help to confirm the diagnosis or help to determine the best treatment. How is this treated? Treatment for this condition depends on the severity of the condition:  Mild impetigo can be treated with prescription antibiotic cream.  Oral antibiotic medicine may be used in more severe cases.  Medicines that reduce itchiness (antihistamines)may also be used. Follow these instructions at home: Medicines  Take over-the-counter and prescription medicines only as told by your health care provider.  Apply or take your antibiotic as told by your health care provider. Do not stop using the antibiotic even if your condition improves. General instructions   To help prevent impetigo from spreading to other body areas: ? Keep your fingernails short and clean. ? Do not scratch the blisters or sores. ? Cover infected areas, if necessary, to keep from scratching. ? Wash your hands often with soap and warm water.  Before applying antibiotic cream or ointment, you should: ? Gently wash the infected areas with antibacterial soap and warm water. ? Soak crusted areas in warm, soapy water using antibacterial soap. ? Gently rub the areas to remove crusts. Do not  scrub.  Do not share towels.  Wash your clothing and bedsheets in warm water that is 140F (60C) or warmer.  Stay home until you have used an antibiotic cream for 48 hours (2 days) or an oral antibiotic medicine for 24 hours (1 day). You should only return to work and activities with other  people if your skin shows significant improvement. ? You may return to contact sports after you have used antibiotic medicine for 72 hours (3 days).  Keep all follow-up visits as told by your health care provider. This is important. How is this prevented?  Wash your hands often with soap and warm water.  Do not share towels, washcloths, clothing, bedding, or razors.  Keep your fingernails short.  Keep any cuts, scrapes, bug bites, or rashes clean and covered.  Use insect repellent to prevent bug bites. Contact a health care provider if:  You develop more blisters or sores even with treatment.  Other family members get sores.  Your skin sores are not improving after 72 hours (3 days) of treatment.  You have a fever. Get help right away if:  You see spreading redness or swelling of the skin around your sores.  You see red streaks coming from your sores.  You develop a sore throat.  The area around your rash becomes warm, red, or tender to the touch.  You have dark, reddish-brown urine.  You do not urinate often or you urinate small amounts.  You are very tired (lethargic).  You have swelling in the face, hands, or feet. Summary  Impetigo is a skin infection that causes itchy blisters and sores that produce brownish-yellow fluid. As the fluid dries, it forms a crust.  This condition is caused by staphylococci or streptococci bacteria. These bacteria cause impetigo when they get under the surface of the skin, such as through cuts, rashes, bug bites, or open sores.  Treatment for this condition may include antibiotic ointment or oral antibiotics.  To help prevent impetigo from spreading to other body areas, make sure you keep your fingernails short, avoid scratching, cover any blisters, and wash your hands often.  If you have impetigo, stay home until you have used an antibiotic cream for 48 hours (2 days) or an oral antibiotic medicine for 24 hours (1 day). You should  only return to work and activities with other people if your skin shows significant improvement. This information is not intended to replace advice given to you by your health care provider. Make sure you discuss any questions you have with your health care provider. Document Revised: 11/20/2018 Document Reviewed: 11/01/2016 Elsevier Patient Education  [place]. Mupirocin skin cream or ointment What is this medicine? MUPIROCIN (myoo PEER oh sin) is an antibiotic. It is used on the skin to treat skin infections. This medicine may be used for other purposes; ask your health care provider or pharmacist if you have questions. COMMON BRAND NAME(S): Bactroban, Centany, Centany AT What should I tell my health care provider before I take this medicine? They need to know if you have any of these conditions:  an unusual or allergic reaction to mupirocin, polyethylene glycol (PEG), or other topical antibiotic medicine  pregnant or trying to get pregnant  breast-feeding How should I use this medicine? This medicine is for external use only. Follow the directions on the prescription label. Wash your hands before and after use. Before applying, wash the affected area with mild soap and water and pat dry. Apply a small  amount to the affected area and rub gently. You can cover the area with a gauze dressing. Do not get this medicine in your eyes. If you do, rinse out with plenty of cool tap water. Do not use your medicine more often than directed. Finish the full course of medicine prescribed by your doctor or health care professional even if you think your condition is better. Do not use over large areas of burnt skin. Talk to your pediatrician regarding the use of this medicine in children. Special care may be needed. Overdosage: If you think you have taken too much of this medicine contact a poison control center or emergency room at once. NOTE: This medicine is only for you. Do not share this  medicine with others. What if I miss a dose? If you miss a dose, take it as soon as you can. If it is almost time for your next dose, take only that dose. Do not take double or extra doses. What may interact with this medicine? Interactions are not expected. Do not use any other skin products on the affected area without telling your doctor or health care professional. This list may not describe all possible interactions. Give your health care provider a list of all the medicines, herbs, non-prescription drugs, or dietary supplements you use. Also tell them if you smoke, drink alcohol, or use illegal drugs. Some items may interact with your medicine. What should I watch for while using this medicine? Tell your doctor or health care professional if your skin condition does not begin to improve within 3 to 5 days. What side effects may I notice from receiving this medicine? Side effects that you should report to your doctor or health care professional as soon as possible:  skin rash, redness, continued swelling, burning, itching, stinging, or pain Side effects that usually do not require medical attention (report to your doctor or health care professional if they continue or are bothersome):  dry skin, itching This list may not describe all possible side effects. Call your doctor for medical advice about side effects. You may report side effects to FDA at 1-800-FDA-1088. Where should I keep my medicine? Keep out of the reach of children. Store at room temperature between 20 and 25 degrees C (68 and 77 degrees F). Throw away any unused medicine after the expiration date. NOTE: This sheet is a summary. It may not cover all possible information. If you have questions about this medicine, talk to your doctor, pharmacist, or health care provider.  2020 Elsevier/Gold Standard (2008-04-28 14:38:18) Sulfamethoxazole; Trimethoprim, SMX-TMP tablets What is this medicine? SULFAMETHOXAZOLE; TRIMETHOPRIM or  SMX-TMP (suhl fuh meth OK suh zohl; trye METH oh prim) is a combination of a sulfonamide antibiotic and a second antibiotic, trimethoprim. It is used to treat or prevent certain kinds of bacterial infections. It will not work for colds, flu, or other viral infections. This medicine may be used for other purposes; ask your health care provider or pharmacist if you have questions. COMMON BRAND NAME(S): Bacter-Aid DS, Bactrim, Bactrim DS, Septra, Septra DS What should I tell my health care provider before I take this medicine? They need to know if you have any of these conditions:  G6PD deficiency  HIV or AIDS  kidney disease  liver disease  low platelets  low red blood cell counts  poor nutrition  stomach or intestine problems like colitis  thyroid disease  an unusual or allergic reaction to sulfamethoxazole, trimethoprim, sulfa drugs, other drugs, foods, dyes, or  preservatives  pregnant or trying to get pregnant  breast-feeding How should I use this medicine? Take this medicine by mouth with a glass of water. Follow the directions on the prescription label. Take your medicine at regular intervals. Do not take it more often than directed. Take all of your medicine as directed even if you think you are better. Do not skip doses or stop your medicine early. Talk to your pediatrician regarding the use of this medicine in children. While this drug may be prescribed for children as young as 2 months for selected conditions, precautions do apply. Overdosage: If you think you have taken too much of this medicine contact a poison control center or emergency room at once. NOTE: This medicine is only for you. Do not share this medicine with others. What if I miss a dose? If you miss a dose, take it as soon as you can. If it is almost time for your next dose, take only that dose. Do not take double or extra doses. What may interact with this medicine? Do not take this medicine with any of the  following medications:  dofetilide This medicine may also interact with the following medications:  amantadine  birth control pills  certain medicines for blood pressure, heart disease  certain medicines for depression, like amitriptyline  certain medicines for diabetes, like glipizide or glyburide  certain medicines that treat or prevent blood clots like warfarin  cyclosporine  digoxin  diuretics  indomethacin  methotrexate  phenytoin  procainamide  pyrimethamine  zidovudine This list may not describe all possible interactions. Give your health care provider a list of all the medicines, herbs, non-prescription drugs, or dietary supplements you use. Also tell them if you smoke, drink alcohol, or use illegal drugs. Some items may interact with your medicine. What should I watch for while using this medicine? Tell your health care provider if your symptoms do not start to get better or if they get worse. Do not treat diarrhea with over the counter products. Contact your health care provider if you have diarrhea that lasts more than 2 days or if it is severe and watery. This drug may cause serious skin reactions. They can happen weeks to months after starting the drug. Contact your health care provider right away if you notice fevers or flu-like symptoms with a rash. The rash may be red or purple and then turn into blisters or peeling of the skin. Or, you might notice a red rash with swelling of the face, lips or lymph nodes in your neck or under your arms. This drug can make you more sensitive to the sun. Keep out of the sun. If you cannot avoid being in the sun, wear protective clothing and sunscreen. Do not use sun lamps or tanning beds/booths. Be careful brushing or flossing your teeth or using a toothpick because you may get an infection or bleed more easily. If you have any dental work done, tell your dentist you are receiving this drug. What side effects may I notice from  receiving this medicine? Side effects that you should report to your doctor or health care professional as soon as possible:  allergic reactions (skin rash, itching or hives; swelling of the face, lips, or tongue)  bloody or watery diarrhea  heartbeat rhythm changes (trouble breathing; chest pain; dizziness; fast, irregular heartbeat; feeling faint or lightheaded, falls,)  fever  high potassium levels (chest pain; fast, irregular heartbeat; muscle weakness)  kidney injury (trouble passing urine or change  in the amount of urine)  low blood sugar (feeling anxious; confusion; dizziness; increased hunger; unusually weak or tired; increased sweating; shakiness; cold, clammy skin; irritable; headache; blurred vision; fast heartbeat; loss of consciousness)  low red blood cell counts (trouble breathing; feeling faint; lightheaded, falls; unusually weak or tired)  rash, fever, and swollen lymph nodes  redness, blistering, peeling, or loosening of the skin, including inside the mouth  unusual bruising or bleeding Side effects that usually do not require medical attention (report to your doctor or health care professional if they continue or are bothersome):  loss of appetite  nausea  vomiting This list may not describe all possible side effects. Call your doctor for medical advice about side effects. You may report side effects to FDA at 1-800-FDA-1088. Where should I keep my medicine? Keep out of the reach of children. Store between 15 and 25 degrees C (59 to 77 degrees F). Protect from light. Keep the container tightly closed. Throw away any unused drug after the expiration date. NOTE: This sheet is a summary. It may not cover all possible information. If you have questions about this medicine, talk to your doctor, pharmacist, or health care provider.  2020 Elsevier/Gold Standard (2019-06-28 12:53:51)

## 2020-09-10 NOTE — Progress Notes (Signed)
Established patient visit   Patient: Troy [name]   DOB: 07-May-1971   49 y.o. Male  MRN: [id number] Visit Date: 09/10/2020  Today's healthcare provider: Marcille Buffy, FNP   Chief Complaint  Patient presents with  . Mouth Lesions   Subjective    HPI  Mouth Lesions: Patient complains of painful skin lesions above his upper lip. Lesions first appeared 2 days ago and had progressively worsened. Patient reports that the lesions are filled with pus. He has tried popping the lesions, but that seems to make them worse.     HIV infection in history  History of impetigo in past he reports.  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  Denies any nasal or inside mouth sores.   Patient  denies any fever, body aches,chills, rash, chest pain, shortness of breath, nausea, vomiting, or diarrhea.       Medications: Outpatient Medications Prior to Visit  Medication Sig  . albuterol (VENTOLIN HFA) 108 (90 Base) MCG/ACT inhaler Inhale 1-2 puffs into the lungs every 6 (six) hours as needed for wheezing or shortness of breath.  . ALPRAZolam (XANAX) 1 MG tablet Take 1 mg by mouth daily as needed.  . ARIPiprazole (ABILIFY) 10 MG tablet Take 10 mg by mouth daily.  . busPIRone (BUSPAR) 30 MG tablet Take 30 mg by mouth 2 (two) times daily.  . cetirizine (ZYRTEC) 10 MG tablet TAKE 1 TABLET BY MOUTH EVERY DAY  . Doxepin HCl (SILENOR) 6 MG TABS Take 6 mg by mouth daily.  Marland Kitchen lubiprostone (AMITIZA) 24 MCG capsule Take 1 capsule (24 mcg total) by mouth 2 (two) times daily with a meal.  . montelukast (SINGULAIR) 10 MG tablet TAKE 1 TABLET BY MOUTH EVERY DAY  . ODEFSEY 200-25-25 MG TABS tablet   . omeprazole (PRILOSEC) 40 MG capsule TAKE 1 CAPSULE BY MOUTH TWICE A DAY *MAX PER INSURANCE  . ondansetron (ZOFRAN ODT) 4 MG disintegrating tablet Take 1 tablet (4 mg total) by mouth every 8 (eight) hours as needed.  . ondansetron (ZOFRAN) 8 MG tablet Take 1 tablet (8 mg total) by mouth every 6 (six) hours  as needed for nausea or vomiting.  . SUMAtriptan (IMITREX) 100 MG tablet TAKE ONE TABLET BY MOUTH AT ONSET OF THE HEADACHE. MAY REPEAT IN TWO HOURS  . tamsulosin (FLOMAX) 0.4 MG CAPS capsule TAKE 1 CAPSULE BY MOUTH EVERY DAY  . tiZANidine (ZANAFLEX) 4 MG capsule TAKE 1 CAPSULE BY MOUTH 2 TIMES DAILY.  Marland Kitchen topiramate (TOPAMAX) 100 MG tablet TAKE 1 TABLET BY MOUTH TWICE A DAY  . traZODone (DESYREL) 100 MG tablet Take 100 mg by mouth 2 (two) times a day.   No facility-administered medications prior to visit.    Review of Systems  Constitutional: Negative for appetite change, chills and fever.  Respiratory: Negative for chest tightness, shortness of breath and wheezing.   Cardiovascular: Negative for chest pain and palpitations.  Gastrointestinal: Negative for abdominal pain, nausea and vomiting.  Skin:       Lesions above upper lip      Objective    BP 111/72 (BP Location: Left Arm, Patient Position: Sitting, Cuff Size: Normal)   Pulse 60   Temp 97.9 F (36.6 C) (Oral)   Resp 16   Wt 184 lb (83.5 kg)   BMI 27.17 kg/m    Physical Exam Constitutional:      General: He is not in acute distress.    Appearance: Normal appearance. He is not ill-appearing,  toxic-appearing or diaphoretic.  HENT:     Head: Normocephalic and atraumatic.     Nose: Nose normal.     Mouth/Throat:     Mouth: Mucous membranes are moist.     Pharynx: Oropharynx is clear. No oropharyngeal exudate or posterior oropharyngeal erythema.  Eyes:     General: No scleral icterus.       Right eye: No discharge.        Left eye: No discharge.     Extraocular Movements: Extraocular movements intact.     Conjunctiva/sclera: Conjunctivae normal.     Pupils: Pupils are equal, round, and reactive to light.  Cardiovascular:     Rate and Rhythm: Normal rate and regular rhythm.     Pulses: Normal pulses.     Heart sounds: Normal heart sounds.  Pulmonary:     Effort: Pulmonary effort is normal.     Breath sounds:  Normal breath sounds.  Abdominal:     Palpations: Abdomen is soft.  Musculoskeletal:        General: Normal range of motion.     Cervical back: Normal range of motion and neck supple. No rigidity.  Lymphadenopathy:     Cervical: No cervical adenopathy.  Skin:    General: Skin is warm.     Capillary Refill: Capillary refill takes less than 2 seconds.     Findings: Rash present. Rash is crusting and pustular.          Comments: See media   Neurological:     Mental Status: He is alert and oriented to person, place, and time.     Motor: No weakness.     Gait: Gait normal.  Psychiatric:        Mood and Affect: Mood normal.        Behavior: Behavior normal.        Thought Content: Thought content normal.        Judgment: Judgment normal.    Media Information   Document Information  Photos    09/10/2020 16:22  Attached To:  Office Visit on 09/10/20 with [place] Dohmen, Kelby Aline, FNP  Source Information  Douglas Rooks, Kelby Aline, FNP  [facility]     No results found for any visits on 09/10/20.  Assessment & Plan      Impetigo - Plan: sulfamethoxazole-trimethoprim (BACTRIM DS) 800-160 MG tablet, mupirocin ointment (BACTROBAN) 2 %  Meds ordered this encounter  Medications  . sulfamethoxazole-trimethoprim (BACTRIM DS) 800-160 MG tablet    Sig: Take 1 tablet by mouth 2 (two) times daily.    Dispense:  20 tablet    Refill:  0  . mupirocin ointment (BACTROBAN) 2 %    Sig: Apply 1 application topically 2 (two) times daily. Apply thin layer only.    Dispense:  30 g    Refill:  0  . cephALEXin (KEFLEX) 500 MG capsule    Sig: Take 1 capsule (500 mg total) by mouth 2 (two) times daily.    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  Return in about 2 weeks (around 09/24/2020), or if symptoms worsen or fail to improve, for at any time for any worsening symptoms, Go to Emergency room/ urgent care if worse.    Time spent 30 minutes.   Marcille Buffy, [place] [phone] (phone) [phone] (fax)  [place]

## 2020-10-01 ENCOUNTER — Other Ambulatory Visit: Payer: Self-pay | Admitting: Gastroenterology

## 2020-10-05 ENCOUNTER — Ambulatory Visit (INDEPENDENT_AMBULATORY_CARE_PROVIDER_SITE_OTHER): Payer: Managed Care, Other (non HMO) | Admitting: Family Medicine

## 2020-10-05 ENCOUNTER — Encounter: Payer: Self-pay | Admitting: Family Medicine

## 2020-10-05 ENCOUNTER — Other Ambulatory Visit: Payer: Self-pay

## 2020-10-05 VITALS — BP 117/80 | HR 61 | Temp 98.0°F | Resp 16 | Ht 69.0 in | Wt 187.4 lb

## 2020-10-05 DIAGNOSIS — N5312 Painful ejaculation: Secondary | ICD-10-CM | POA: Insufficient documentation

## 2020-10-05 DIAGNOSIS — G8929 Other chronic pain: Secondary | ICD-10-CM | POA: Diagnosis not present

## 2020-10-05 DIAGNOSIS — Z23 Encounter for immunization: Secondary | ICD-10-CM | POA: Insufficient documentation

## 2020-10-05 DIAGNOSIS — M545 Low back pain, unspecified: Secondary | ICD-10-CM

## 2020-10-05 NOTE — Assessment & Plan Note (Addendum)
2 years, tearing pain that last 25 minutes. No Blood with ejaculation or abnormal discharge. Also endorses aching lower back pain. No blood with urination, urgency, polyuria or dysuria.  Jan 2020 CT showed normal prostate  Prostate exam reassuring Concerning for prostate cancer vs BPH vs. Prostatitis vs. Kidney stone PSA F/u with urology

## 2020-10-05 NOTE — Progress Notes (Signed)
Established patient visit   Patient: Troy [name]   DOB: [birth year]   49 y.o. Male  MRN: [id number] Visit Date: 10/05/2020  Today's healthcare provider: Lavon Paganini, MD   Chief Complaint  Patient presents with  . Pain with ejaculation   Subjective    HPI HPI    Potentially something prostate related. Pt would not tell me.   Last edited by Sylvester Harder, [place] on 10/05/2020  9:36 AM. (History)       Troy [name] is a 49 y/o male with a pmh of nephrolithiasis and well controlled HIV who presents with 2 years of tearing pain with ejaculation.The pain lasts for about 25 minutes is non radiating and 'deep' in the pelvic area. After ejaculation he Is unable to urinate after for several hours. He notes no blood with ejaculation, blood with urination or abnormal urethral discharge. He describes his urine stream as 'normal' without hesitancy or urgency and reports no dysuria or nocturia.  He has had no trauma to the rectal or perineal area. He also endorses some deep aching back pain, non radiating and occurring at rest along with changes in gait. He finds himself stumbling 'randomly' for the last 6 months, but has not fallen. He does not experience dizziness or vertigo with these episodes.  He has had changes in vision or headaches. 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 He notes no easy bruising or night sweats.    Social History   Tobacco Use  . Smoking status: Current Some Day Smoker    Packs/day: 1.00    Years: 25.00    Pack years: 25.00    Types: E-cigarettes    Last attempt to quit: 2013    Years since quitting: 8.9  . Smokeless tobacco: Never Used  Vaping Use  . Vaping Use: Every day  Substance Use Topics  . 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 albuterol (VENTOLIN HFA) 108 (90 Base) MCG/ACT inhaler Inhale 1-2 puffs into the lungs every 6 (six) hours as needed for wheezing or shortness of breath.  . ALPRAZolam (XANAX) 1 MG tablet Take 1 mg by mouth daily as needed.  . AMITIZA 24 MCG capsule TAKE 1 CAPSULE (24 MCG TOTAL) BY MOUTH 2 (TWO) TIMES DAILY WITH A MEAL.  Marland Kitchen ARIPiprazole (ABILIFY) 10 MG tablet Take 10 mg by mouth daily.  . busPIRone (BUSPAR) 30 MG tablet Take 30 mg by mouth 2 (two) times daily.  . cetirizine (ZYRTEC) 10 MG tablet TAKE 1 TABLET BY MOUTH EVERY DAY  . Doxepin HCl 6 MG TABS Take 6 mg by mouth daily.  . montelukast (SINGULAIR) 10 MG tablet TAKE 1 TABLET BY MOUTH EVERY DAY  . mupirocin ointment (BACTROBAN) 2 % Apply 1 application topically 2 (two) times daily. Apply thin layer only.  . ODEFSEY 200-25-25 MG TABS tablet   . ondansetron (ZOFRAN ODT) 4 MG disintegrating tablet Take 1 tablet (4 mg total) by mouth every 8 (eight) hours as needed.  . ondansetron (ZOFRAN) 8 MG tablet Take 1 tablet (8 mg total) by mouth every 6 (six) hours as needed for nausea or vomiting.  . SUMAtriptan (IMITREX) 100 MG tablet TAKE ONE TABLET BY MOUTH AT  ONSET OF THE HEADACHE. MAY REPEAT IN TWO HOURS  . tamsulosin (FLOMAX) 0.4 MG CAPS capsule TAKE 1 CAPSULE BY MOUTH EVERY DAY  . tiZANidine (ZANAFLEX) 4 MG capsule TAKE 1 CAPSULE BY MOUTH 2 TIMES DAILY.  Marland Kitchen topiramate (TOPAMAX) 100 MG tablet TAKE 1 TABLET BY MOUTH TWICE A DAY  . traZODone (DESYREL) 100 MG tablet Take 100 mg by mouth 2 (two) times a day.  . cephALEXin (KEFLEX) 500 MG capsule Take 1 capsule (500 mg total) by mouth 2 (two) times daily. (Patient not taking: Reported on 10/05/2020)  . omeprazole (PRILOSEC) 40 MG capsule TAKE 1 CAPSULE BY MOUTH TWICE A DAY *MAX PER INSURANCE (Patient not taking: Reported on 10/05/2020)  . sulfamethoxazole-trimethoprim (BACTRIM DS) 800-160 MG tablet Take 1 tablet by mouth 2 (two) times daily. (Patient not taking: Reported on  10/05/2020)   No facility-administered medications prior to visit.    Review of Systems  Constitutional: Negative.   HENT: Negative.   Eyes: Negative.   Respiratory: Negative.   Cardiovascular: Negative.   Gastrointestinal: Negative.   Endocrine: Negative.   Genitourinary: Negative.   Musculoskeletal: Positive for back pain.  Skin: Negative.   Allergic/Immunologic: Negative.   Neurological: Negative.   Hematological: Negative.   Psychiatric/Behavioral: The patient is nervous/anxious.        Objective    BP 117/80 (BP Location: Right Arm, Patient Position: Sitting, Cuff Size: Large)   Pulse 61   Temp 98 F (36.7 C) (Oral)   Resp 16   Ht 5' 9"  (1.753 m)   Wt 187 lb 6.4 oz (85 kg)   SpO2 100%   BMI 27.67 kg/m    Physical Exam   General: NAD, anxious appearing Lungs: CTA bilaterally Heart: RRR no murmurs rubs or gallops GI: Non-distended, non-tender Prostate Exam: No nodules or enlargement detected.   Psych: anxious appearance  No results found for any visits on 10/05/20.  Assessment & Plan     Problem List Items Addressed This Visit      Other   Pain with ejaculation - Primary    2 years, tearing pain that last 25 minutes. No Blood with ejaculation or abnormal discharge. Also endorses aching lower back pain. No blood with urination, urgency, polyuria or dysuria.  Jan 2020 CT showed normal prostate  Prostate exam reassuring Concerning for prostate cancer vs BPH vs. Prostatitis vs. Kidney stone PSA F/u with urology      Relevant Orders   PSA Total (Reflex To Free)   Chronic bilateral low back pain without sciatica    Started approximately 2 years ago, nor associated with trauma or overuse. Occurs at rest Also experiences stumbling during the last 6 months. No dizziness or vertigo. C/f mets of prostate disease vs. Mild disc degeneration. Continue to monitor and assess with prostate evaluation      Need for influenza vaccination   Relevant Orders    Flu Vaccine QUAD 36+ mos IM (Completed)       Return for as scheduled.      Total time spent on today's visit was greater than 30 minutes, including both face-to-face time and nonface-to-face time personally spent on review of chart (labs and imaging), discussing labs and goals, discussing further work-up, treatment options, referrals to specialist if needed, reviewing outside records of pertinent, answering patient's questions, and coordinating care.    Patient seen along with [employer] student [facility]. I personally evaluated this patient along with the student, and verified all aspects of the history, physical exam,  and medical decision making as documented by the student. I agree with the student's documentation and have made all necessary edits.  Domanique Luckett, Dionne Bucy, MD, MPH [facility]

## 2020-10-05 NOTE — Progress Notes (Signed)
Pt would like flu vaccination.

## 2020-10-05 NOTE — Assessment & Plan Note (Signed)
Started approximately 2 years ago, nor associated with trauma or overuse. Occurs at rest Also experiences stumbling during the last 6 months. No dizziness or vertigo. C/f mets of prostate disease vs. Mild disc degeneration. Continue to monitor and assess with prostate evaluation

## 2020-10-06 LAB — PSA TOTAL (REFLEX TO FREE): Prostate Specific Ag, Serum: 1 ng/mL (ref 0.0–4.0)

## 2020-10-06 NOTE — Progress Notes (Signed)
10/07/2020 5:49 PM   [name] [birth year] [id number]  Referring provider: Virginia Crews, MD [address] [place],  [place] [address]  Chief Complaint  Patient presents with  . Other    Painful ejaculation     HPI: Troy [name] is a 49 y.o. male who presents today at the request of Dr. Brita Romp for painful ejaculation for two years.    He was last seen in our office in 05/2019 for LU TS and nephrolithiasis with Dr. Bernardo Heater.  At that time, he was given tamsulosin for the LU TS and was instructed to call back if it was ineffective.   Contrast CT 02/2019 noted a punctate right renal stone and left renal stones.  RUS in 06/2019 confirms the left renal stones.  KUB 11/2019 noting left renal stones.    He has been experiencing pain after ejaculation for approximately 2 years. He states that he masturbates 5 times a week while laying supine in bed prior to going to sleep. He states the third time he ejaculates, he experiences pain that feels like something is being ripped out to him immediately after ejaculation. The pain lasts for 30 minutes and it is very intense. He states he has no pain with the erections, he has no curvature to his erections, he has no pain during masturbation and the ejaculation itself is not painful. He also experiences the urge to urinate for several hours on the days he has pain after ejaculation. He is unable to urinate during this time.  He also has pain that last for several days in the suprapubic area, but it is not as intense.  He has not been sexually active with a partner since last 2 years due to the fact he would be embarrassed to experience that pain with a partner.   SHIM    Row Name 10/07/20 0855         SHIM: Over the last 6 months:   How do you rate your confidence that you could get and keep an erection? Very High     When you had erections with sexual stimulation, how often were your erections hard enough for penetration  (entering your partner)? Almost Always or Always     During sexual intercourse, how often were you able to maintain your erection after you had penetrated (entered) your partner? Almost Always or Always     During sexual intercourse, how difficult was it to maintain your erection to completion of intercourse? Not Difficult     When you attempted sexual intercourse, how often was it satisfactory for you? Sometimes (about half the time)           SHIM Total Score   SHIM 23            He has been experiencing urge incontinence for the last year. He states the incontinence is so severe that he has to change his clothes. He has also experiencing urinary intermittency and on rare occasions straining to urinate.  Patient denies any modifying or aggravating factors.  Patient denies any gross hematuria, dysuria or suprapubic/flank pain.  Patient denies any fevers, chills, nausea or vomiting.   There was noted mild diffuse thickening in the urinary bladder wall on the contrast CT dated in May 2020.  His UA is benign.  His PVR is 0 mL.      IPSS    Row Name 10/07/20 0800         International Prostate Symptom  Score   How often have you had the sensation of not emptying your bladder? Not at All     How often have you had to urinate less than every two hours? Not at All     How often have you found you stopped and started again several times when you urinated? Less than half the time     How often have you found it difficult to postpone urination? Almost always     How often have you had a weak urinary stream? Not at All     How often have you had to strain to start urination? Not at All     How many times did you typically get up at night to urinate? None     Total IPSS Score 7           Quality of Life due to urinary symptoms   If you were to spend the rest of your life with your urinary condition just the way it is now how would you feel about that? Mixed            Score:  1-7  Mild 8-19 Moderate 20-35 Severe   He denies constipation and diarrhea.   PMH: Past Medical History:  Diagnosis Date  . Asthma   . Bipolar 1 disorder ([place])   . Diabetes mellitus without complication ([place])   . History of CVA with residual deficit   . HIV infection ([place])   . Nephrolithiasis     Surgical History: Past Surgical History:  Procedure Laterality Date  . COLONOSCOPY    . COLONOSCOPY WITH PROPOFOL N/A 05/30/2019   Procedure: COLONOSCOPY WITH PROPOFOL;  Surgeon: Lin Landsman, MD;  Location: [facility] ENDOSCOPY;  Service: Gastroenterology;  Laterality: N/A;  . ELBOW SURGERY Left    fracture  . ESOPHAGOGASTRODUODENOSCOPY (EGD) WITH PROPOFOL N/A 05/30/2019   Procedure: ESOPHAGOGASTRODUODENOSCOPY (EGD) WITH PROPOFOL;  Surgeon: Lin Landsman, MD;  Location: [facility];  Service: Gastroenterology;  Laterality: N/A;  . ESOPHAGOGASTRODUODENOSCOPY (EGD) WITH PROPOFOL N/A 09/12/2019   Procedure: ESOPHAGOGASTRODUODENOSCOPY (EGD) WITH PROPOFOL;  Surgeon: Lin Landsman, MD;  Location[facility] ENDOSCOPY;  Service: Gastroenterology;  Laterality: N/A;  . FACIAL COSMETIC SURGERY    . GASTRIC BYPASS    . HERNIA REPAIR    . KIDNEY STONE SURGERY    . renal artery repair      Home Medications:  Allergies as of 10/07/2020      Reactions   Niacin Anaphylaxis   Orange Oil Anaphylaxis      Medication List       Accurate as of October 07, 2020 11:59 PM. If you have any questions, ask your nurse or doctor.        STOP taking these medications   cephALEXin 500 MG capsule Commonly known as: KEFLEX Stopped by: Ruthanna Macchia, PA-C   sulfamethoxazole-trimethoprim 800-160 MG tablet Commonly known as: BACTRIM DS Stopped by: Denaja Verhoeven, PA-C   tamsulosin 0.4 MG Caps capsule Commonly known as: FLOMAX Stopped by: Ernst Cumpston, PA-C     TAKE these medications   albuterol 108 (90 Base) MCG/ACT inhaler Commonly known as: VENTOLIN HFA Inhale 1-2 puffs into the  lungs every 6 (six) hours as needed for wheezing or shortness of breath.   ALPRAZolam 1 MG tablet Commonly known as: XANAX Take 1 mg by mouth daily as needed.   Amitiza 24 MCG capsule Generic drug: lubiprostone TAKE 1 CAPSULE (24 MCG TOTAL) BY MOUTH 2 (TWO) TIMES DAILY WITH A MEAL.  ARIPiprazole 10 MG tablet Commonly known as: ABILIFY Take 10 mg by mouth daily.   busPIRone 30 MG tablet Commonly known as: BUSPAR Take 30 mg by mouth 2 (two) times daily.   cetirizine 10 MG tablet Commonly known as: ZYRTEC TAKE 1 TABLET BY MOUTH EVERY DAY   Doxepin HCl 6 MG Tabs Take 6 mg by mouth daily.   mirabegron ER 25 MG Tb24 tablet Commonly known as: MYRBETRIQ Take 1 tablet (25 mg total) by mouth daily. Started by: Zara Council, PA-C   montelukast 10 MG tablet Commonly known as: SINGULAIR TAKE 1 TABLET BY MOUTH EVERY DAY   mupirocin ointment 2 % Commonly known as: BACTROBAN Apply 1 application topically 2 (two) times daily. Apply thin layer only.   Odefsey 200-25-25 MG Tabs tablet Generic drug: emtricitabine-rilpivir-tenofovir AF   OLANZapine 5 MG tablet Commonly known as: ZYPREXA Take by mouth.   omeprazole 40 MG capsule Commonly known as: PRILOSEC TAKE 1 CAPSULE BY MOUTH TWICE A DAY *MAX PER INSURANCE   ondansetron 4 MG disintegrating tablet Commonly known as: Zofran ODT Take 1 tablet (4 mg total) by mouth every 8 (eight) hours as needed.   ondansetron 8 MG tablet Commonly known as: ZOFRAN Take 1 tablet (8 mg total) by mouth every 6 (six) hours as needed for nausea or vomiting.   SUMAtriptan 100 MG tablet Commonly known as: IMITREX TAKE ONE TABLET BY MOUTH AT ONSET OF THE HEADACHE. MAY REPEAT IN TWO HOURS   tiZANidine 4 MG capsule Commonly known as: ZANAFLEX TAKE 1 CAPSULE BY MOUTH 2 TIMES DAILY.   topiramate 100 MG tablet Commonly known as: TOPAMAX TAKE 1 TABLET BY MOUTH TWICE A DAY   traZODone 100 MG tablet Commonly known as: DESYREL Take 100 mg by  mouth 2 (two) times a day.       Allergies:  Allergies  Allergen Reactions  . Niacin Anaphylaxis  . Orange Oil Anaphylaxis    Family History: Family History  Problem Relation Age of Onset  . COPD Mother   . Diabetes Mother   . Hypertension Mother   . Hyperlipidemia Mother   . Healthy Father   . Cancer Father        skin  . Stroke Brother   . Heart disease Brother   . Seizures Brother   . Thyroid disease Brother   . Cancer Maternal Grandmother        breast  . Hyperlipidemia Maternal Grandmother   . Hypertension Maternal Grandmother   . Diabetes Maternal Grandfather   . Hyperlipidemia Maternal Grandfather   . Heart disease Paternal Grandfather   . Hypertension Paternal Grandfather     Social History:  reports that he has been smoking e-cigarettes. He has a 25.00 pack-year smoking history. He has never used smokeless tobacco. He reports previous alcohol use. He reports previous drug use.  ROS: Pertinent ROS in HPI  Physical Exam: BP 118/80 (BP Location: Left Arm, Patient Position: Sitting, Cuff Size: Large)   Pulse 66   Ht 5' 9"  (1.753 m)   Wt 187 lb (84.8 kg)   BMI 27.62 kg/m   Constitutional:  Well nourished. Alert and oriented, No acute distress. Tense.  HEENT: [place] AT, mask in place.  Trachea midline Cardiovascular: No clubbing, cyanosis, or edema. Respiratory: Normal respiratory effort, no increased work of breathing. GU: No CVA tenderness.  No bladder fullness or masses.  Patient with circumcised phallus.   Urethral meatus is patent.  No penile discharge. No penile lesions or rashes.  Scrotum without lesions, cysts, rashes and/or edema.  Testicles are located scrotally bilaterally. No masses are appreciated in the testicles. Left and right epididymis are normal. Rectal: Patient with  normal sphincter tone. Anus and perineum without scarring or rashes. No rectal masses are appreciated. Prostate is approximately 45 grams, no nodules are appreciated. Seminal  vesicles could not be palpated Lymph: No cervical or inguinal adenopathy. Neurologic: Grossly intact, no focal deficits, moving all 4 extremities. Psychiatric: Normal mood and affect.  Laboratory Data: Lab Results  Component Value Date   WBC 7.4 01/22/2020   HGB 14.9 01/22/2020   HCT 43.6 01/22/2020   MCV 89.0 01/22/2020   PLT 186 01/22/2020    Lab Results  Component Value Date   CREATININE 1.20 01/22/2020    Lab Results  Component Value Date   HGBA1C 5.1 05/21/2020       Component Value Date/Time   CHOL 139 12/10/2019 0846   HDL 38 (L) 12/10/2019 0846   CHOLHDL 3.7 12/10/2019 0846   LDLCALC 76 12/10/2019 0846    Lab Results  Component Value Date   AST 22 01/22/2020   Lab Results  Component Value Date   ALT 15 01/22/2020   Component     Latest Ref Rng & Units 10/05/2020  Prostate Specific Ag, Serum     0.0 - 4.0 ng/mL 1.0   Urinalysis Component     Latest Ref Rng & Units 10/07/2020  Specific Gravity, UA     1.005 - 1.030 1.025  pH, UA     5.0 - 7.5 5.5  Color, UA     Yellow Yellow  Appearance Ur     Clear Clear  Leukocytes,UA     Negative Negative  Protein,UA     Negative/Trace Negative  Glucose, UA     Negative Negative  Ketones, UA     Negative Negative  RBC, UA     Negative Negative  Bilirubin, UA     Negative Negative  Urobilinogen, Ur     0.2 - 1.0 mg/dL 0.2  Nitrite, UA     Negative Negative  Microscopic Examination      See below:   Component     Latest Ref Rng & Units 10/07/2020  WBC, UA     0 - 5 /hpf 0-5  RBC     0 - 2 /hpf 0-2  Epithelial Cells (non renal)     0 - 10 /hpf 0-10  Mucus, UA     Not Estab. Present (A)  Bacteria, UA     None seen/Few None seen  I have reviewed the labs.   Pertinent Imaging: CLINICAL DATA:  Daily morning vomiting. History of gastric sleeve surgery and Crohn's disease.  EXAM: CT ABDOMEN AND PELVIS WITH CONTRAST  TECHNIQUE: Multidetector CT imaging of the abdomen and pelvis was  performed using the standard protocol following bolus administration of intravenous contrast.  CONTRAST:  110m OMNIPAQUE IOHEXOL 300 MG/ML  SOLN  COMPARISON:  None.  FINDINGS: Lower chest: Limited visualization of the lower thorax is negative for focal airspace opacity or pleural effusion.  Normal heart size.  No pericardial effusion.  Hepatobiliary: Normal hepatic contour. There is a minimal amount of focal fatty infiltration adjacent to the fissure for the ligamentum teres. No discrete hepatic lesions. Normal appearance of the gallbladder given degree distention. No radiopaque gallstones. No intra or extrahepatic biliary duct dilatation. No ascites.  Pancreas: Normal appearance of pancreas.  Spleen: Normal appearance of the spleen.  Adrenals/Urinary Tract:  There is symmetric enhancement and excretion of the bilateral kidneys. Note is made of 4 nonobstructing left-sided renal stones with dominant interpolar stone measuring 0.6 cm in greatest short axis diameter (coronal image 59, series 5. There is a solitary punctate (approximately 2 mm) nonobstructing stone within the interpolar region of the right kidney (coronal image 55, series 5). No discrete renal lesions. No urinary obstruction or perinephric stranding.  There is mild thickening of the crux of the left adrenal gland without discrete nodule. Normal appearance of the right adrenal gland.  There is mild diffuse thickening the urinary bladder wall, likely accentuated due to underdistention.  Stomach/Bowel: Postsurgical change of the stomach likely compatible with provided history of gastric sleeve surgery. Sequela of presumed prior ileocecectomy. Enteric contrast extends to the level of the ascending colon. Large colonic stool burden. Presumed transient short-segment small bowel intussusception within the left mid hemiabdomen extending for a length of approximately 3.3 cm (coronal image 46, series 5;  axial image 33, series 2), without definitive lead point for associated adjacent mesenteric stranding and thus of doubtful clinical concern. No discrete areas of bowel wall thickening. No pneumoperitoneum or pneumatosis or portal venous gas.  Vascular/Lymphatic: Normal caliber the abdominal aorta. The major branch vessels of the abdominal aorta appear patent on this non CTA examination.  No bulky retroperitoneal, mesenteric, pelvic or inguinal lymph adenopathy.  Reproductive: Normal appearance the prostate gland. No free fluid the pelvic cul-de-sac.  Other: Regional soft tissues appear normal.  Musculoskeletal: No acute or aggressive osseous abnormalities. Normal appearance of the bilateral SI joints.  IMPRESSION: 1. No definite explanation for patient's daily morning vomiting. Ossific leak, no definitive CT evidence of active inflammatory bowel disease 2. Sequela of previous gastric sleeve surgery and ileocecectomy without evidence of enteric obstruction. 3. Presumed transient small bowel short-segment intussusception without definitive lead point or associated mesenteric stranding and thus of doubtful clinical concern. 4. Large colonic stool burden.   Electronically Signed   By: Sandi Mariscal M.D.   On: 03/06/2019 16:31  CLINICAL DATA:  Nephrolithiasis  EXAM: RENAL / URINARY TRACT ULTRASOUND COMPLETE  COMPARISON:  CT abdomen pelvis Mar 06, 2019  FINDINGS: Right Kidney:  Renal measurements: 11.2 x 5.9 x 5.8 cm = volume: 201.2 mL . Echogenicity within normal limits. No mass or hydronephrosis visualized.  Left Kidney:  Renal measurements: 12.2 x 5.6 x 5.4 cm = volume: 193.5 mL. Few shadowing calculi are present in the interpolar region and lower pole of the left kidney. Largest measures 8 mm in size corresponding well with a calcifications seen on prior CT. A left upper pole calcification is not well visualized on this exam. Renal echogenicity is  within normal limits. No concerning renal mass or hydronephrosis.  Bladder:  Appears normal for degree of bladder distention.  IMPRESSION: Nonobstructive left nephrolithiasis.  No hydronephrosis.   Electronically Signed   By: Lovena Le M.D.   On: 06/26/2019 05:17  CLINICAL DATA:  Nephrolithiasis.  EXAM: ABDOMEN - 1 VIEW  COMPARISON:  None.  FINDINGS: Extensive gas and stool overlies the kidneys diminishing sensitivity. No right renal calcifications identified. Two stones are suspected within the left kidney, the largest of which is in the upper pole measuring 7 mm. No stones identified along the course of the ureters. No bladder calculi. Bowel gas pattern unremarkable.  IMPRESSION: Suspect left renal calculi, the largest of which is in the upper pole measuring 7 mm.   Electronically Signed   By: Queen Slough.D.  On: 12/10/2019 10:47  Results for KIELAN, DREISBACH "[name]" (MRN [id number]) as of 10/07/2020 09:00  Ref. Range 10/07/2020 08:51  Scan Result Unknown 1m   I have independently reviewed the films.  See HPI.   Assessment & Plan:    1. Painful ejaculation Pain seems to happen after the ejaculation is completed We will have the patient discontinue tamsulosin as it does have ejaculatory disorders as a side effect He will also masturbate in different positions to see if this will ease the pain as well I may also consider pelvic floor physical therapy if discontinuing the tamsulosin and masturbating to physicians does not ease the pain  2. Urge incontinence We will have a trial of an OAB medication to see if this improves his urge incontinence There may be an element of dyssynergia as he does have episodes of intermittency, bladder wall thickening on CT and there is intense pain associated with the urge to urinate after ejaculation May consider cystoscopic examination in the future and/or urodynamics if symptoms do not improve with the  discontinuation of tamsulosin and the addition of Myrbetriq  Return in about 1 month (around 11/07/2020) for symptoms recheck .  These notes generated with voice recognition software. I apologize for typographical errors.  SZara Council PA-C  [id number] [place] [phone] I spent 30 minutes on the day of the encounter to include pre-visit record review, face-to-face time with the patient, and post-visit ordering of tests.

## 2020-10-07 ENCOUNTER — Other Ambulatory Visit: Payer: Self-pay

## 2020-10-07 ENCOUNTER — Encounter: Payer: Managed Care, Other (non HMO) | Admitting: Family Medicine

## 2020-10-07 ENCOUNTER — Encounter: Payer: Self-pay | Admitting: Urology

## 2020-10-07 ENCOUNTER — Ambulatory Visit (INDEPENDENT_AMBULATORY_CARE_PROVIDER_SITE_OTHER): Payer: Managed Care, Other (non HMO) | Admitting: Urology

## 2020-10-07 VITALS — BP 118/80 | HR 66 | Ht 69.0 in | Wt 187.0 lb

## 2020-10-07 DIAGNOSIS — N5312 Painful ejaculation: Secondary | ICD-10-CM

## 2020-10-07 DIAGNOSIS — N3941 Urge incontinence: Secondary | ICD-10-CM

## 2020-10-07 LAB — URINALYSIS, COMPLETE
Bilirubin, UA: NEGATIVE
Glucose, UA: NEGATIVE
Ketones, UA: NEGATIVE
Leukocytes,UA: NEGATIVE
Nitrite, UA: NEGATIVE
Protein,UA: NEGATIVE
RBC, UA: NEGATIVE
Specific Gravity, UA: 1.025 (ref 1.005–1.030)
Urobilinogen, Ur: 0.2 mg/dL (ref 0.2–1.0)
pH, UA: 5.5 (ref 5.0–7.5)

## 2020-10-07 LAB — BLADDER SCAN AMB NON-IMAGING

## 2020-10-07 LAB — MICROSCOPIC EXAMINATION: Bacteria, UA: NONE SEEN

## 2020-10-07 MED ORDER — MIRABEGRON ER 25 MG PO TB24: 25.0000 mg | ORAL_TABLET | Freq: Every day | ORAL | 0 refills | Status: DC

## 2020-10-07 NOTE — Telephone Encounter (Signed)
LMTCB 10/07/2020.  PEC please advise pt of labs results.     Thanks,   -Mickel Baas

## 2020-10-07 NOTE — Telephone Encounter (Signed)
-----   Message from Virginia Crews, MD sent at 10/06/2020  8:13 AM EST ----- Normal PSA.

## 2020-10-07 NOTE — Patient Instructions (Signed)
Stop taking the tamsulosin (Flomax)  Start Myrbetriq 25 mg daily  And try different positions

## 2020-10-11 LAB — GC/CHLAMYDIA PROBE AMP
Chlamydia trachomatis, NAA: NEGATIVE
Neisseria Gonorrhoeae by PCR: NEGATIVE

## 2020-10-12 LAB — CULTURE, URINE COMPREHENSIVE

## 2020-10-13 LAB — MYCOPLASMA / UREAPLASMA CULTURE
Mycoplasma hominis Culture: NEGATIVE
Ureaplasma urealyticum: NEGATIVE

## 2020-10-26 ENCOUNTER — Other Ambulatory Visit: Payer: Self-pay | Admitting: Urology

## 2020-10-26 DIAGNOSIS — R3915 Urgency of urination: Secondary | ICD-10-CM

## 2020-10-26 DIAGNOSIS — R350 Frequency of micturition: Secondary | ICD-10-CM

## 2020-11-05 NOTE — Progress Notes (Signed)
11/06/2020 7:54 PM   Troy [name] 12/04/1970 [id number]  Referring provider: Virginia Crews, MD [address] [place],  [place] [address]  Chief Complaint  Patient presents with  . Nephrolithiasis    Urological history 1. Nephrolithiasis - Contrast CT 02/2019 noted a punctate right renal stone and left renal stones.  RUS in 06/2019 confirms the left renal stones.  KUB 11/2019 noting left renal stones.    - taking tamsulosin 0.4 mg daily - KUB 7 mm and 3 mm stone in left kidney  2. Painful ejaculation - GC/Chlamydia cultures negative - Mycoplasma/ureaplasma cultures negative - UA negative - urine culture negative - PSA 1.0  3. Urge incontinence - large volume incontinence - PVR 0 mL  HPI: Troy [name] is a 50 y.o. male who presents today for follow up.  He states stopping the tamsulosin and masturbating in different position have ease the painful ejaculation.  The Myrbetriq 25 mg samples has also helped with the urinary urgency.  Patient denies any modifying or aggravating factors.  Patient denies any gross hematuria, dysuria or suprapubic/flank pain.  Patient denies any fevers, chills, nausea or vomiting.   He will be relocating to [place] within the next month.  He is originally from [place] and already has an established urologist there.   PMH: Past Medical History:  Diagnosis Date  . Asthma   . Bipolar 1 disorder ([place])   . Diabetes mellitus without complication ([place])   . History of CVA with residual deficit   . HIV infection ([place])   . Nephrolithiasis     Surgical History: Past Surgical History:  Procedure Laterality Date  . COLONOSCOPY    . COLONOSCOPY WITH PROPOFOL N/A 05/30/2019   Procedure: COLONOSCOPY WITH PROPOFOL;  Surgeon: Lin Landsman, MD;  Location[facility] ENDOSCOPY;  Service: Gastroenterology;  Laterality: N/A;  . ELBOW SURGERY Left    fracture  . ESOPHAGOGASTRODUODENOSCOPY (EGD) WITH PROPOFOL N/A 05/30/2019   Procedure:  ESOPHAGOGASTRODUODENOSCOPY (EGD) WITH PROPOFOL;  Surgeon: Lin Landsman, MD;  Location: [facility];  Service: Gastroenterology;  Laterality: N/A;  . ESOPHAGOGASTRODUODENOSCOPY (EGD) WITH PROPOFOL N/A 09/12/2019   Procedure: ESOPHAGOGASTRODUODENOSCOPY (EGD) WITH PROPOFOL;  Surgeon: Lin Landsman, MD;  Location: [facility] ENDOSCOPY;  Service: Gastroenterology;  Laterality: N/A;  . FACIAL COSMETIC SURGERY    . GASTRIC BYPASS    . HERNIA REPAIR    . KIDNEY STONE SURGERY    . renal artery repair      Home Medications:  Allergies as of 11/06/2020      Reactions   Niacin Anaphylaxis   Orange Oil Anaphylaxis      Medication List       Accurate as of November 06, 2020 11:59 PM. If you have any questions, ask your nurse or doctor.        STOP taking these medications   mirabegron ER 25 MG Tb24 tablet Commonly known as: MYRBETRIQ Replaced by: Trospium Chloride 60 MG Cp24 Stopped by: Avigayil Ton, PA-C   ondansetron 8 MG tablet Commonly known as: ZOFRAN Stopped by: Zara Council, PA-C     TAKE these medications   albuterol 108 (90 Base) MCG/ACT inhaler Commonly known as: VENTOLIN HFA Inhale 1-2 puffs into the lungs every 6 (six) hours as needed for wheezing or shortness of breath.   ALPRAZolam 1 MG tablet Commonly known as: XANAX Take 1 mg by mouth daily as needed.   Amitiza 24 MCG capsule Generic drug: lubiprostone TAKE 1 CAPSULE (24 MCG TOTAL) BY  MOUTH 2 (TWO) TIMES DAILY WITH A MEAL.   ARIPiprazole 20 MG tablet Commonly known as: ABILIFY Take 20 mg by mouth at bedtime. What changed: Another medication with the same name was removed. Continue taking this medication, and follow the directions you see here. Changed by: Zara Council, PA-C   busPIRone 30 MG tablet Commonly known as: BUSPAR Take 30 mg by mouth 2 (two) times daily.   cetirizine 10 MG tablet Commonly known as: ZYRTEC TAKE 1 TABLET BY MOUTH EVERY DAY   Doxepin HCl 6 MG Tabs Take 6 mg by  mouth daily.   montelukast 10 MG tablet Commonly known as: SINGULAIR TAKE 1 TABLET BY MOUTH EVERY DAY   mupirocin ointment 2 % Commonly known as: BACTROBAN Apply 1 application topically 2 (two) times daily. Apply thin layer only.   Odefsey 200-25-25 MG Tabs tablet Generic drug: emtricitabine-rilpivir-tenofovir AF   OLANZapine 5 MG tablet Commonly known as: ZYPREXA Take by mouth.   omeprazole 40 MG capsule Commonly known as: PRILOSEC TAKE 1 CAPSULE BY MOUTH TWICE A DAY *MAX PER INSURANCE   ondansetron 4 MG disintegrating tablet Commonly known as: Zofran ODT Take 1 tablet (4 mg total) by mouth every 8 (eight) hours as needed.   SUMAtriptan 100 MG tablet Commonly known as: IMITREX TAKE ONE TABLET BY MOUTH AT ONSET OF THE HEADACHE. MAY REPEAT IN TWO HOURS   tiZANidine 4 MG capsule Commonly known as: ZANAFLEX TAKE 1 CAPSULE BY MOUTH 2 TIMES DAILY.   topiramate 100 MG tablet Commonly known as: TOPAMAX TAKE 1 TABLET BY MOUTH TWICE A DAY   traZODone 100 MG tablet Commonly known as: DESYREL Take 100 mg by mouth 2 (two) times a day.   Trospium Chloride 60 MG Cp24 Take 1 capsule (60 mg total) by mouth daily. Replaces: mirabegron ER 25 MG Tb24 tablet Started by: Zara Council, PA-C       Allergies:  Allergies  Allergen Reactions  . Niacin Anaphylaxis  . Orange Oil Anaphylaxis    Family History: Family History  Problem Relation Age of Onset  . COPD Mother   . Diabetes Mother   . Hypertension Mother   . Hyperlipidemia Mother   . Healthy Father   . Cancer Father        skin  . Stroke Brother   . Heart disease Brother   . Seizures Brother   . Thyroid disease Brother   . Cancer Maternal Grandmother        breast  . Hyperlipidemia Maternal Grandmother   . Hypertension Maternal Grandmother   . Diabetes Maternal Grandfather   . Hyperlipidemia Maternal Grandfather   . Heart disease Paternal Grandfather   . Hypertension Paternal Grandfather     Social  History:  reports that he has been smoking e-cigarettes. He has a 25.00 pack-year smoking history. He has never used smokeless tobacco. He reports previous alcohol use. He reports previous drug use.  ROS: Pertinent ROS in HPI  Physical Exam: BP 125/81   Pulse 81   Ht 5' 9"  (1.753 m)   Wt 185 lb (83.9 kg)   BMI 27.32 kg/m   Constitutional:  Well nourished. Alert and oriented, No acute distress. HEENT: [place] AT, mask in place.  Trachea midline Cardiovascular: No clubbing, cyanosis, or edema. Respiratory: Normal respiratory effort, no increased work of breathing. Neurologic: Grossly intact, no focal deficits, moving all 4 extremities. Psychiatric: Normal mood and affect.  Laboratory Data: Lab Results  Component Value Date   WBC 7.4 01/22/2020  HGB 14.9 01/22/2020   HCT 43.6 01/22/2020   MCV 89.0 01/22/2020   PLT 186 01/22/2020    Lab Results  Component Value Date   CREATININE 1.20 01/22/2020    Lab Results  Component Value Date   HGBA1C 5.1 05/21/2020       Component Value Date/Time   CHOL 139 12/10/2019 0846   HDL 38 (L) 12/10/2019 0846   CHOLHDL 3.7 12/10/2019 0846   LDLCALC 76 12/10/2019 0846    Lab Results  Component Value Date   AST 22 01/22/2020   Lab Results  Component Value Date   ALT 15 01/22/2020   Component     Latest Ref Rng & Units 10/05/2020  Prostate Specific Ag, Serum     0.0 - 4.0 ng/mL 1.0   Urinalysis Component     Latest Ref Rng & Units 10/07/2020  Specific Gravity, UA     1.005 - 1.030 1.025  pH, UA     5.0 - 7.5 5.5  Color, UA     Yellow Yellow  Appearance Ur     Clear Clear  Leukocytes,UA     Negative Negative  Protein,UA     Negative/Trace Negative  Glucose, UA     Negative Negative  Ketones, UA     Negative Negative  RBC, UA     Negative Negative  Bilirubin, UA     Negative Negative  Urobilinogen, Ur     0.2 - 1.0 mg/dL 0.2  Nitrite, UA     Negative Negative  Microscopic Examination      See below:    Component     Latest Ref Rng & Units 10/07/2020  WBC, UA     0 - 5 /hpf 0-5  RBC     0 - 2 /hpf 0-2  Epithelial Cells (non renal)     0 - 10 /hpf 0-10  Mucus, UA     Not Estab. Present (A)  Bacteria, UA     None seen/Few None seen  I have reviewed the labs.   Pertinent Imaging: CLINICAL DATA:  Kidney stones  EXAM: ABDOMEN - 1 VIEW  COMPARISON:  12/10/2019  FINDINGS: Extensive gas and stool overlies the kidneys. No right renal calculi identified. There are 2 stones within the left kidney the largest of which measures 7 mm. These appear similar to previous exam. No calcifications identified within the expected location of the ureters or urinary bladder. Postsurgical changes within the left upper quadrant of the abdomen are again noted with multiple surgical staples.  IMPRESSION: 1. Left renal calculi, similar to previous exam.   Electronically Signed   By: Kerby Moors M.D.   On: 11/06/2020 13:25 I have independently reviewed the films.  See HPI.   Assessment & Plan:    1. Painful ejaculation Resolved for now with the discontinuation of tamsulosin and masturbating in a different position He will follow-up with his urologist in [place]  2. Urge incontinence He found improvement with his urge incontinence using Myrbetriq, but unfortunately it was not covered by his insurance so we needed to switch to trospium 60 mg daily.  Prescription is sent to pharmacy He will follow-up in trial it with his urologist  3. Nephrolithiasis Left renal stones stable  Return for Patient relocating to [facility] and will follow-up with his urologist there.  These notes generated with voice recognition software. I apologize for typographical errors.  Zara Council, [place] [address]  [place],  [place] [address] [phone]

## 2020-11-06 ENCOUNTER — Ambulatory Visit
Admission: RE | Admit: 2020-11-06 | Discharge: 2020-11-06 | Disposition: A | Discharge status: Home / Self Care | Payer: Managed Care, Other (non HMO) | Source: Ambulatory Visit | Attending: Urology | Admitting: Urology

## 2020-11-06 ENCOUNTER — Other Ambulatory Visit: Payer: Self-pay

## 2020-11-06 ENCOUNTER — Ambulatory Visit (INDEPENDENT_AMBULATORY_CARE_PROVIDER_SITE_OTHER): Payer: Managed Care, Other (non HMO) | Admitting: Urology

## 2020-11-06 ENCOUNTER — Encounter: Payer: Self-pay | Admitting: Urology

## 2020-11-06 ENCOUNTER — Ambulatory Visit
Admission: RE | Admit: 2020-11-06 | Discharge: 2020-11-06 | Disposition: A | Discharge status: Home / Self Care | Payer: Managed Care, Other (non HMO) | Attending: Urology | Admitting: Urology

## 2020-11-06 VITALS — BP 125/81 | HR 81 | Ht 69.0 in | Wt 185.0 lb

## 2020-11-06 DIAGNOSIS — N5312 Painful ejaculation: Secondary | ICD-10-CM

## 2020-11-06 DIAGNOSIS — N200 Calculus of kidney: Secondary | ICD-10-CM | POA: Diagnosis not present

## 2020-11-06 DIAGNOSIS — N3941 Urge incontinence: Secondary | ICD-10-CM

## 2020-11-06 MED ORDER — TROSPIUM CHLORIDE ER 60 MG PO CP24: 60.0000 mg | ORAL_CAPSULE | Freq: Every day | ORAL | 3 refills | Status: DC

## 2020-11-12 ENCOUNTER — Encounter: Payer: Managed Care, Other (non HMO) | Admitting: Family Medicine

## 2020-11-30 ENCOUNTER — Other Ambulatory Visit: Payer: Self-pay | Admitting: Family Medicine

## 2020-11-30 NOTE — Telephone Encounter (Signed)
Requested medication (s) are due for refill today:  yes  Requested medication (s) are on the active medication list: yes  Last refill:  09/09/2020  Future visit scheduled: yes  Notes to clinic: this refill cannot be delegated    Requested Prescriptions  Pending Prescriptions Disp Refills   tiZANidine (ZANAFLEX) 4 MG capsule [Pharmacy Med Name: TIZANIDINE HCL 4 MG CAPSULE] 180 capsule 1    Sig: TAKE 1 CAPSULE BY MOUTH 2 TIMES DAILY.      Not Delegated - Cardiovascular:  Alpha-2 Agonists - tizanidine Failed - 11/30/2020  1:26 AM      Failed - This refill cannot be delegated      Passed - Valid encounter within last 6 months    Recent Outpatient Visits           1 month ago Pain with ejaculation   [facility] [place], Dionne Bucy, MD   2 months ago [place], FNP   6 months ago Hypoglycemia   [facility], Clearnce Sorrel, [place]   1 year ago Essential hypertension   [facility] [place], Dionne Bucy, MD   1 year ago Pain of upper abdomen   [facility] Jerrol Banana., MD       Future Appointments             In 1 month Bacigalupo, Dionne Bucy, MD [facility], [place]

## 2020-12-17 ENCOUNTER — Ambulatory Visit: Payer: Managed Care, Other (non HMO) | Admitting: Urology

## 2021-01-26 ENCOUNTER — Telehealth: Payer: Self-pay

## 2021-01-26 NOTE — Telephone Encounter (Signed)
Can't do virtual CPE - ok to reschedule. Can keep virtual visit if he has concerns to discuss

## 2021-01-26 NOTE — Telephone Encounter (Signed)
Copied from [place] [id number]. Topic: Appointment Scheduling - Scheduling Inquiry for Clinic >> Jan 25, 2021  5:26 PM Erick Blinks wrote: Pt would like to keep appt but make it virtual, advised that CPE appts are in office. Wants to know if PCP will agree to virtual CPE because he can get lab work in [place] he says.  Best contact: [phone]

## 2021-01-26 NOTE — Telephone Encounter (Signed)
Please advise 

## 2021-01-26 NOTE — Telephone Encounter (Signed)
LMTCB, PEC Triage Nurse may give patient results  

## 2021-01-27 NOTE — Telephone Encounter (Signed)
Patient has switched PCP

## 2021-01-27 NOTE — Telephone Encounter (Signed)
Patient called to advise of the below message from Dr. Brita Romp. He verbalized understanding. He says he has moved to [place] and has a new doctor that he will start seeing next week. I advised I will cancel the appointment.

## 2021-01-28 ENCOUNTER — Encounter: Payer: Managed Care, Other (non HMO) | Admitting: Family Medicine

## 2021-04-13 IMAGING — US US RENAL
1 series · 14 of 25 positions shown · non-contrast
Comparison: CT abdomen pelvis March 06, 2019

CLINICAL DATA: Nephrolithiasis

EXAM:
RENAL / URINARY TRACT ULTRASOUND COMPLETE

[Series 1: us renal · 0.23mm/px · 14 of 45 slices shown]
[im 1/45]
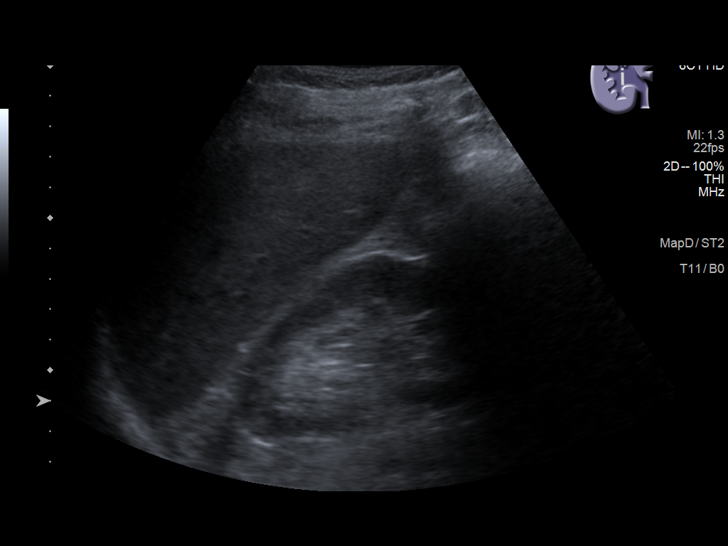
[im 4/45]
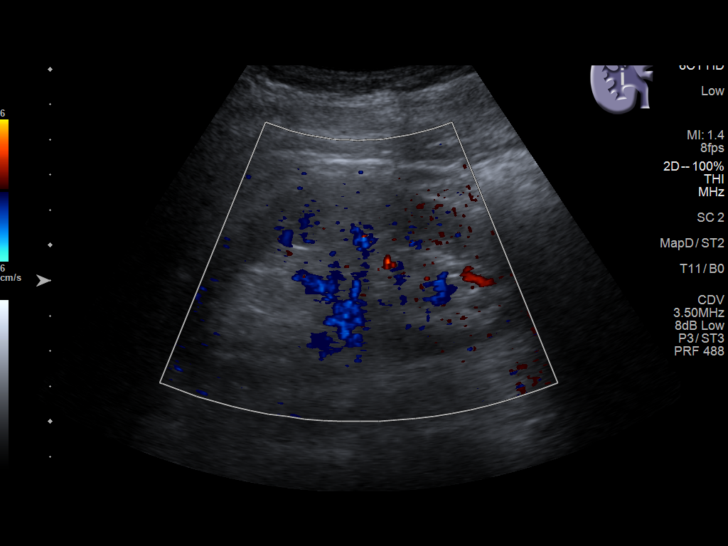
[im 8/45]
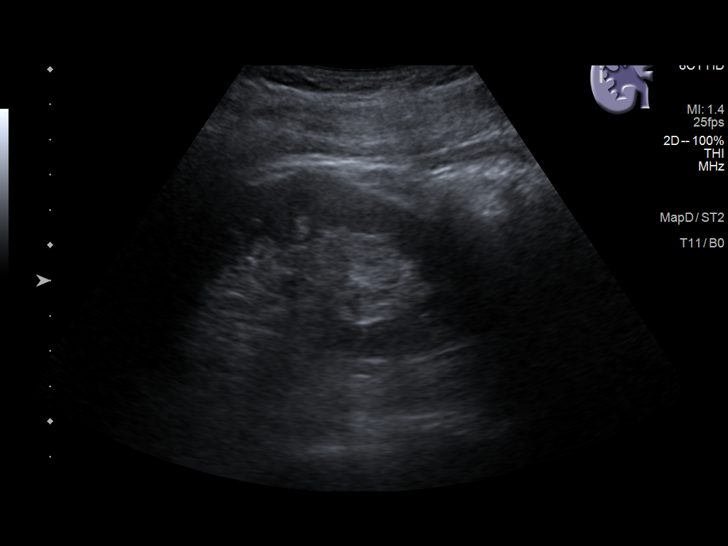
[im 12/45]
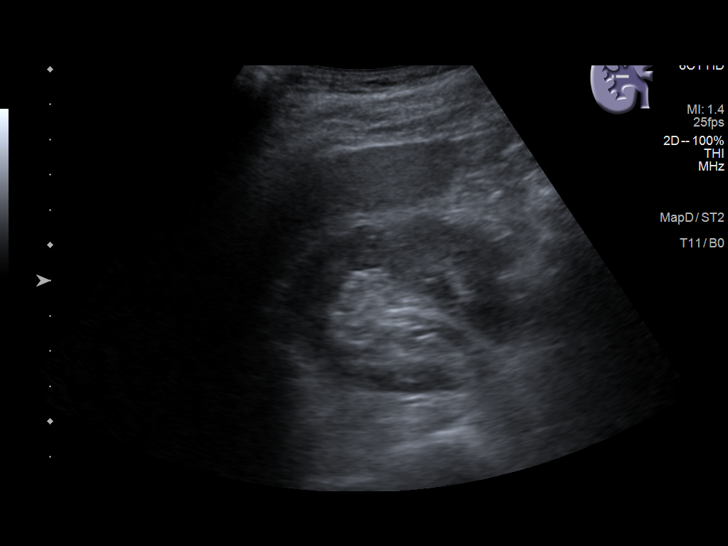
[im 15/45]
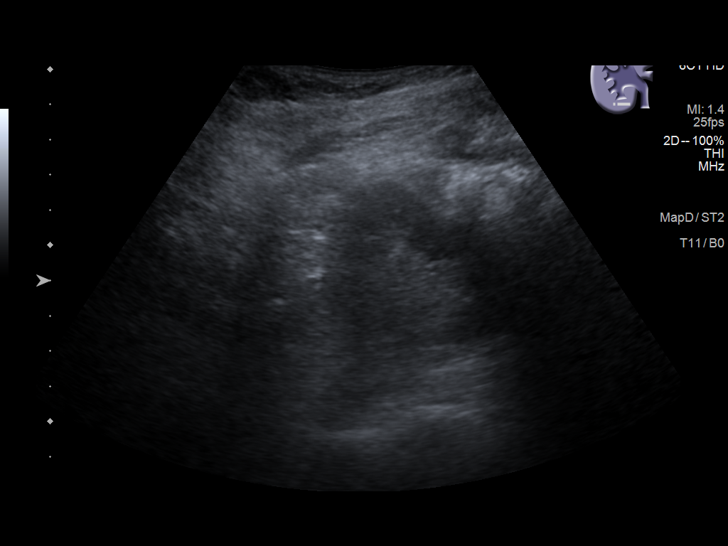
[im 17/45]
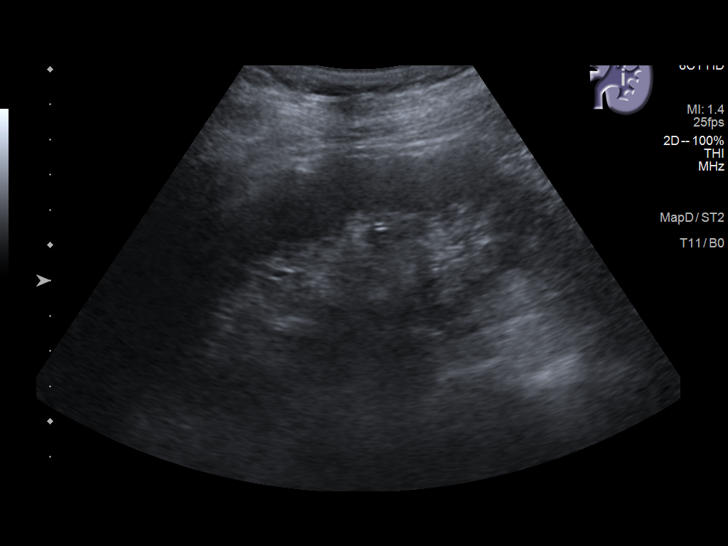
[im 21/45]
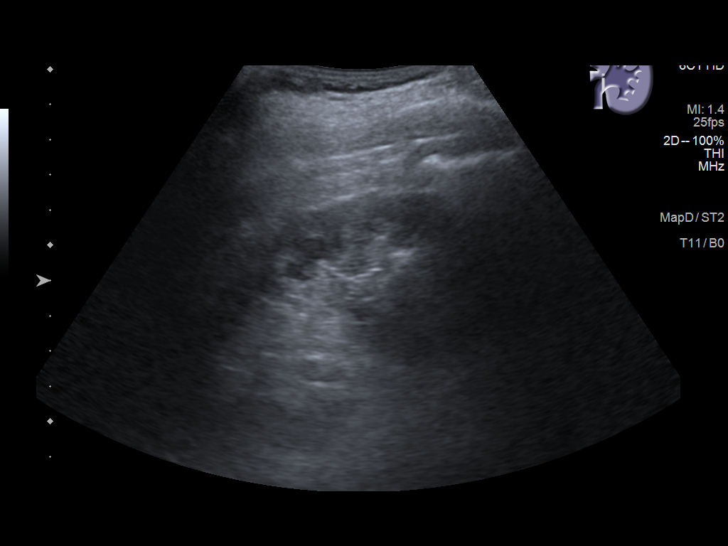
[im 24/45]
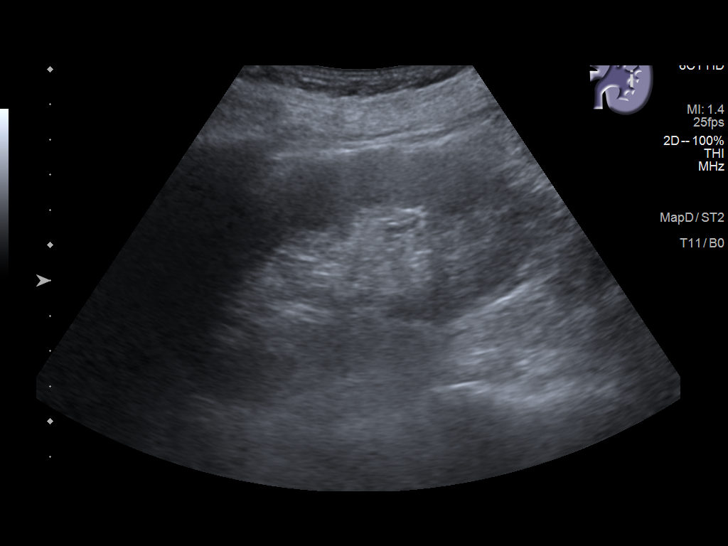
[im 28/45]
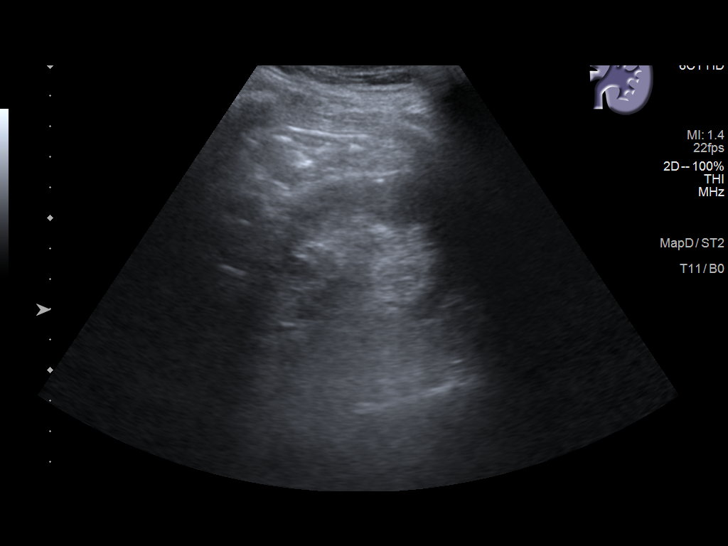
[im 30/45]
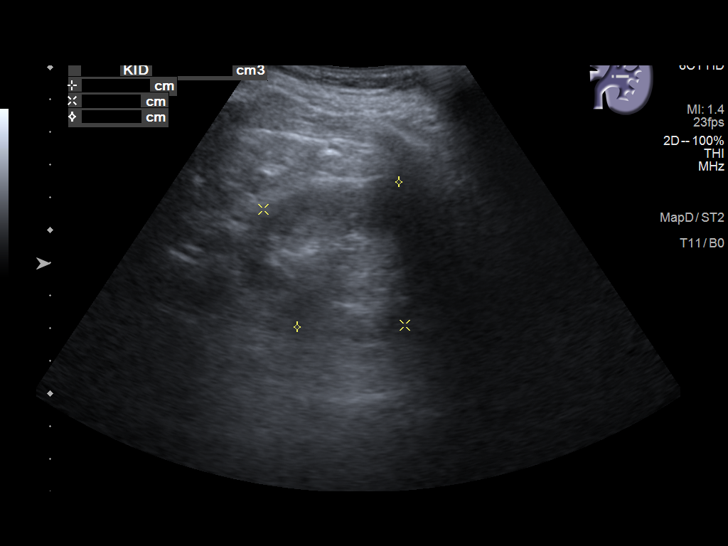
[im 34/45]
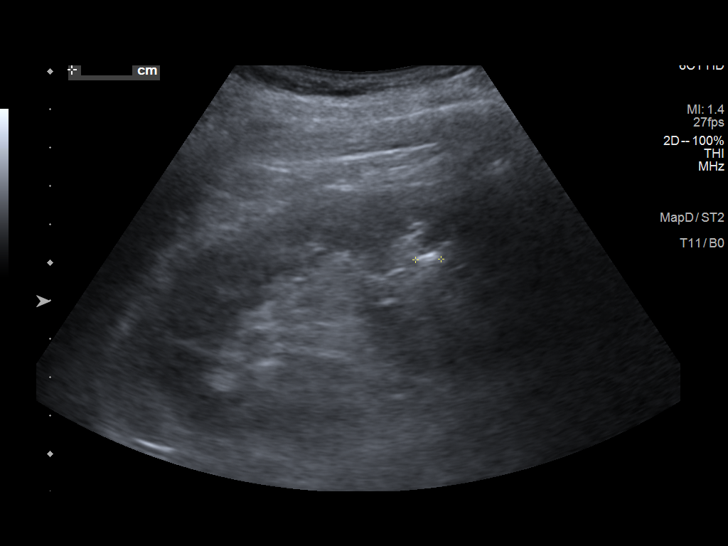
[im 37/45]
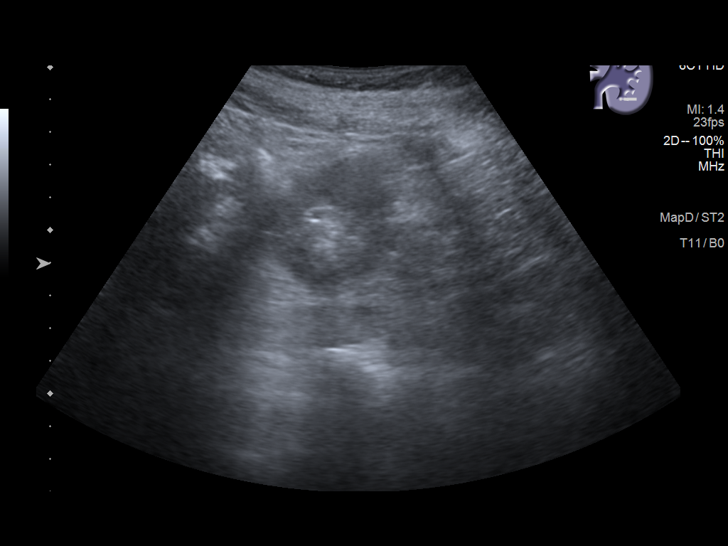
[im 41/45]
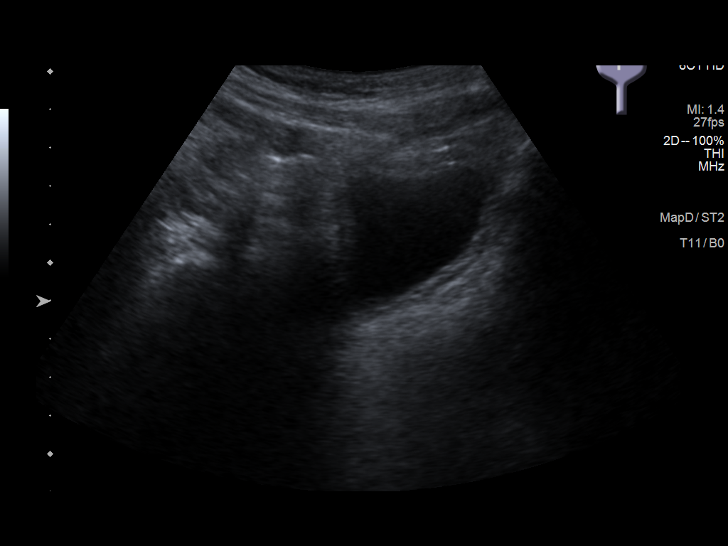
[im 45/45]
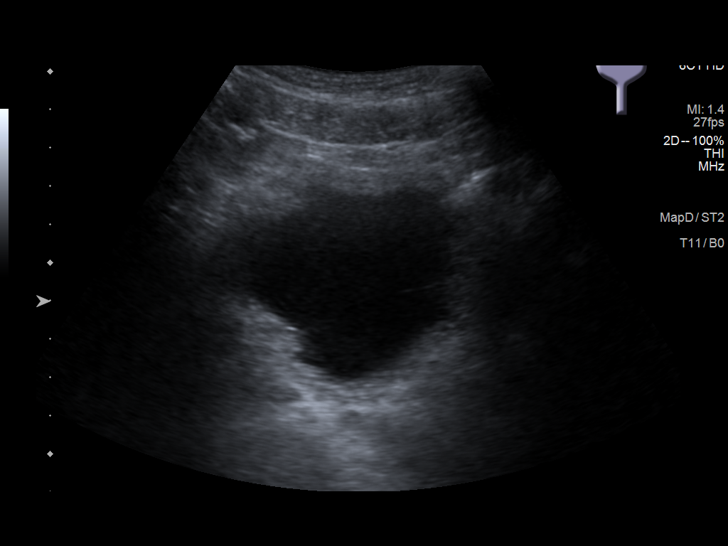

[14 of 25 positions shown; findings below may reference images not displayed]

FINDINGS: Right Kidney:

Renal measurements: 11.2 x 5.9 x 5.8 cm = volume: 201.2 mL .
Echogenicity within normal limits. No mass or hydronephrosis
visualized.

Left Kidney:

Renal measurements: 12.2 x 5.6 x 5.4 cm = volume: 193.5 mL. Few
shadowing calculi are present in the interpolar region and lower
pole of the left kidney. Largest measures 8 mm in size corresponding
well with a calcifications seen on prior CT. A left upper pole
calcification is not well visualized on this exam. Renal
echogenicity is within normal limits. No concerning renal mass or
hydronephrosis.

Bladder:

Appears normal for degree of bladder distention.
IMPRESSION: Nonobstructive left nephrolithiasis.  No hydronephrosis.

## 2021-09-01 DIAGNOSIS — L738 Other specified follicular disorders: Secondary | ICD-10-CM | POA: Insufficient documentation

## 2021-11-11 ENCOUNTER — Encounter: Payer: Self-pay | Admitting: Internal Medicine

## 2021-12-03 ENCOUNTER — Other Ambulatory Visit: Payer: Self-pay

## 2021-12-03 DIAGNOSIS — B20 Human immunodeficiency virus [HIV] disease: Secondary | ICD-10-CM

## 2021-12-03 DIAGNOSIS — Z113 Encounter for screening for infections with a predominantly sexual mode of transmission: Secondary | ICD-10-CM

## 2021-12-06 ENCOUNTER — Other Ambulatory Visit: Payer: Self-pay

## 2021-12-10 ENCOUNTER — Other Ambulatory Visit: Payer: Self-pay

## 2021-12-10 DIAGNOSIS — B20 Human immunodeficiency virus [HIV] disease: Secondary | ICD-10-CM

## 2021-12-10 DIAGNOSIS — Z113 Encounter for screening for infections with a predominantly sexual mode of transmission: Secondary | ICD-10-CM

## 2021-12-10 NOTE — Addendum Note (Signed)
Addended by: Daisy Floro T on: 12/10/2021 08:38 AM   Modules accepted: Orders

## 2021-12-16 LAB — CBC WITH DIFFERENTIAL/PLATELET
Absolute Monocytes: 484 cells/uL (ref 200–950)
Basophils Absolute: 41 cells/uL (ref 0–200)
Basophils Relative: 0.7 %
Eosinophils Absolute: 30 cells/uL (ref 15–500)
Eosinophils Relative: 0.5 %
HCT: 47.1 % (ref 38.5–50.0)
Hemoglobin: 16 g/dL (ref 13.2–17.1)
Lymphs Abs: 1269 cells/uL (ref 850–3900)
MCH: 30.4 pg (ref 27.0–33.0)
MCHC: 34 g/dL (ref 32.0–36.0)
MCV: 89.5 fL (ref 80.0–100.0)
MPV: 11.3 fL (ref 7.5–12.5)
Monocytes Relative: 8.2 %
Neutro Abs: 4077 cells/uL (ref 1500–7800)
Neutrophils Relative %: 69.1 %
Platelets: 222 10*3/uL (ref 140–400)
RBC: 5.26 10*6/uL (ref 4.20–5.80)
RDW: 12.1 % (ref 11.0–15.0)
Total Lymphocyte: 21.5 %
WBC: 5.9 10*3/uL (ref 3.8–10.8)

## 2021-12-16 LAB — COMPLETE METABOLIC PANEL WITH GFR
AG Ratio: 1.6 (calc) (ref 1.0–2.5)
ALT: 19 U/L (ref 9–46)
AST: 23 U/L (ref 10–40)
Albumin: 4.4 g/dL (ref 3.6–5.1)
Alkaline phosphatase (APISO): 55 U/L (ref 36–130)
BUN: 10 mg/dL (ref 7–25)
CO2: 28 mmol/L (ref 20–32)
Calcium: 10 mg/dL (ref 8.6–10.3)
Chloride: 102 mmol/L (ref 98–110)
Creat: 1.22 mg/dL (ref 0.60–1.29)
Globulin: 2.8 g/dL (calc) (ref 1.9–3.7)
Glucose, Bld: 94 mg/dL (ref 65–99)
Potassium: 4.6 mmol/L (ref 3.5–5.3)
Sodium: 139 mmol/L (ref 135–146)
Total Bilirubin: 0.6 mg/dL (ref 0.2–1.2)
Total Protein: 7.2 g/dL (ref 6.1–8.1)
eGFR: 73 mL/min/{1.73_m2} (ref >=60)

## 2021-12-16 LAB — URINALYSIS
Bilirubin Urine: NEGATIVE
Glucose, UA: NEGATIVE
Hgb urine dipstick: NEGATIVE
Ketones, ur: NEGATIVE
Leukocytes,Ua: NEGATIVE
Nitrite: NEGATIVE
Protein, ur: NEGATIVE
Specific Gravity, Urine: 1.013 (ref 1.001–1.035)
pH: 5.5 (ref 5.0–8.0)

## 2021-12-16 LAB — LIPID PANEL
Cholesterol: 176 mg/dL (ref <200)
HDL: 54 mg/dL (ref >=40)
LDL Cholesterol (Calc): 104 mg/dL (calc) — ABNORMAL HIGH
Non-HDL Cholesterol (Calc): 122 mg/dL (calc) (ref <130)
Total CHOL/HDL Ratio: 3.3 (calc) (ref <5.0)
Triglycerides: 86 mg/dL (ref <150)

## 2021-12-16 LAB — QUANTIFERON-TB GOLD PLUS
Mitogen-NIL: >10 IU/mL
NIL: 0.04 IU/mL
QuantiFERON-TB Gold Plus: NEGATIVE
TB1-NIL: 0.03 IU/mL
TB2-NIL: 0.03 IU/mL

## 2021-12-16 LAB — HIV-1/2 AB - DIFFERENTIATION
HIV-1 antibody: POSITIVE — AB
HIV-2 Ab: UNDETERMINED — AB

## 2021-12-16 LAB — HEPATITIS C ANTIBODY
Hepatitis C Ab: NONREACTIVE
SIGNAL TO CUT-OFF: 0.02 (ref <1.00)

## 2021-12-16 LAB — HEPATITIS B CORE ANTIBODY, TOTAL: Hep B Core Total Ab: NONREACTIVE

## 2021-12-16 LAB — RPR: RPR Ser Ql: NONREACTIVE

## 2021-12-16 LAB — HIV ANTIBODY (ROUTINE TESTING W REFLEX): HIV 1&2 Ab, 4th Generation: REACTIVE — AB

## 2021-12-16 LAB — HIV-1 RNA ULTRAQUANT REFLEX TO GENTYP+
HIV 1 RNA Quant: 23 copies/mL — ABNORMAL HIGH
HIV-1 RNA Quant, Log: 1.36 Log copies/mL — ABNORMAL HIGH

## 2021-12-16 LAB — HLA B*5701: HLA-B*5701 w/rflx HLA-B High: NEGATIVE

## 2021-12-16 LAB — HEPATITIS B SURFACE ANTIGEN: Hepatitis B Surface Ag: NONREACTIVE

## 2021-12-16 LAB — T-HELPER CELLS (CD4) COUNT (NOT AT ARMC)
Absolute CD4: 359 cells/uL — ABNORMAL LOW (ref 490–1740)
CD4 T Helper %: 30 % (ref 30–61)
Total lymphocyte count: 1212 cells/uL (ref 850–3900)

## 2021-12-16 LAB — C. TRACHOMATIS/N. GONORRHOEAE RNA
C. trachomatis RNA, TMA: NOT DETECTED
N. gonorrhoeae RNA, TMA: NOT DETECTED

## 2021-12-16 LAB — HEPATITIS B SURFACE ANTIBODY,QUALITATIVE: Hep B S Ab: NONREACTIVE

## 2021-12-16 LAB — HEPATITIS A ANTIBODY, TOTAL: Hepatitis A AB,Total: NONREACTIVE

## 2021-12-22 ENCOUNTER — Encounter: Payer: Self-pay | Admitting: Internal Medicine

## 2021-12-27 ENCOUNTER — Telehealth: Payer: Self-pay

## 2021-12-27 ENCOUNTER — Other Ambulatory Visit (HOSPITAL_COMMUNITY): Payer: Self-pay

## 2021-12-27 NOTE — Telephone Encounter (Signed)
[facility] Patient Advocate Encounter ? ?Insurance verification completed.   ? ?The patient is uninsured and will need patient assistance for medication. ? ?We can complete the application and will need to meet with the patient for signatures and income documentation. ? ?Ileene Patrick, CPhT ?Specialty Pharmacy Patient Advocate ?[place] [facility] ?Phone: [phone] ?Fax:  [phone]  ?

## 2021-12-29 ENCOUNTER — Encounter: Payer: Self-pay | Admitting: Internal Medicine

## 2021-12-29 ENCOUNTER — Ambulatory Visit (INDEPENDENT_AMBULATORY_CARE_PROVIDER_SITE_OTHER): Payer: Self-pay | Admitting: Internal Medicine

## 2021-12-29 ENCOUNTER — Other Ambulatory Visit: Payer: Self-pay

## 2021-12-29 ENCOUNTER — Ambulatory Visit: Payer: Self-pay

## 2021-12-29 ENCOUNTER — Ambulatory Visit (INDEPENDENT_AMBULATORY_CARE_PROVIDER_SITE_OTHER): Payer: Self-pay | Admitting: Pharmacist

## 2021-12-29 VITALS — BP 123/82 | HR 67 | Temp 98.2°F | Resp 16 | Ht 68.0 in | Wt 198.0 lb

## 2021-12-29 DIAGNOSIS — Z79899 Other long term (current) drug therapy: Secondary | ICD-10-CM | POA: Insufficient documentation

## 2021-12-29 DIAGNOSIS — Z7185 Encounter for immunization safety counseling: Secondary | ICD-10-CM | POA: Insufficient documentation

## 2021-12-29 DIAGNOSIS — Z23 Encounter for immunization: Secondary | ICD-10-CM

## 2021-12-29 DIAGNOSIS — B20 Human immunodeficiency virus [HIV] disease: Secondary | ICD-10-CM

## 2021-12-29 DIAGNOSIS — Z113 Encounter for screening for infections with a predominantly sexual mode of transmission: Secondary | ICD-10-CM | POA: Insufficient documentation

## 2021-12-29 MED ORDER — ODEFSEY 200-25-25 MG PO TABS: 1.0000 | ORAL_TABLET | Freq: Every day | ORAL | 11 refills | Status: DC

## 2021-12-29 NOTE — Assessment & Plan Note (Signed)
Patient is currently on [place] which he has been on for several years.  He has good long term control of his HIV and recent labs here with viral load 23 copies and CD4 count 359 (30%).  Will continue daily Odefsey and provide refills today.  Patient also met with pharmacy today.  He has hx of gastric sleeve ~ 8 years ago so will have to consider absorption issues but for now seems to be working.  Advised to avoid PPI with Odefsey. RTC 6 months.   ?

## 2021-12-29 NOTE — Progress Notes (Signed)
?  ? ? ? ? ?[facility] for Infectious Disease ? ?Reason for Consult: HIV new patient ?Referring Provider: Dr Salley Scarlet ? ? ?HPI:   ? ?Troy [name] is a 51 y.o. male with a past medical history as below who presents to clinic as a new patient for HIV care.   ? ?Patient has history of HIV and has been in care at [facility] in [place] with Dr. Roddie Mc III, MD.  He was last seen in November 2022.  During that visit he has had excellent long term control of his HIV and reported no missed doses.  He is currently on Odefsey.   ? ? ? ?Patient's Medications  ?New Prescriptions  ? No medications on file  ?Previous Medications  ? ALBUTEROL (VENTOLIN HFA) 108 (90 BASE) MCG/ACT INHALER    Inhale 1-2 puffs into the lungs every 6 (six) hours as needed for wheezing or shortness of breath.  ? ALPRAZOLAM (XANAX) 1 MG TABLET    Take 1 mg by mouth daily as needed.  ? AMITIZA 24 MCG CAPSULE    TAKE 1 CAPSULE (24 MCG TOTAL) BY MOUTH 2 (TWO) TIMES DAILY WITH A MEAL.  ? ARIPIPRAZOLE (ABILIFY) 20 MG TABLET    Take 20 mg by mouth at bedtime.  ? BUSPIRONE (BUSPAR) 30 MG TABLET    Take 30 mg by mouth 2 (two) times daily.  ? CETIRIZINE (ZYRTEC) 10 MG TABLET    TAKE 1 TABLET BY MOUTH EVERY DAY  ? DOXEPIN HCL 6 MG TABS    Take 6 mg by mouth daily.  ? MONTELUKAST (SINGULAIR) 10 MG TABLET    TAKE 1 TABLET BY MOUTH EVERY DAY  ? MUPIROCIN OINTMENT (BACTROBAN) 2 %    Apply 1 application topically 2 (two) times daily. Apply thin layer only.  ? OLANZAPINE (ZYPREXA) 5 MG TABLET    Take by mouth.  ? OMEPRAZOLE (PRILOSEC) 40 MG CAPSULE    TAKE 1 CAPSULE BY MOUTH TWICE A DAY *MAX PER INSURANCE  ? ONDANSETRON (ZOFRAN ODT) 4 MG DISINTEGRATING TABLET    Take 1 tablet (4 mg total) by mouth every 8 (eight) hours as needed.  ? SUMATRIPTAN (IMITREX) 100 MG TABLET    TAKE ONE TABLET BY MOUTH AT ONSET OF THE HEADACHE. MAY REPEAT IN TWO HOURS  ? TIZANIDINE (ZANAFLEX) 4 MG CAPSULE    TAKE 1 CAPSULE BY MOUTH 2 TIMES DAILY.  ? TOPIRAMATE (TOPAMAX) 100 MG  TABLET    TAKE 1 TABLET BY MOUTH TWICE A DAY  ? TRAZODONE (DESYREL) 100 MG TABLET    Take 100 mg by mouth 2 (two) times a day.  ? TROSPIUM CHLORIDE 60 MG CP24    Take 1 capsule (60 mg total) by mouth daily.  ?Modified Medications  ? Modified Medication Previous Medication  ? ODEFSEY 200-25-25 MG TABS TABLET ODEFSEY 200-25-25 MG TABS tablet  ?    Take 1 tablet by mouth daily.      ?Discontinued Medications  ? No medications on file  ?   ? ?Past Medical History:  ?Diagnosis Date  ? Asthma   ? Bipolar 1 disorder ([place])   ? Diabetes mellitus without complication ([place])   ? History of CVA with residual deficit   ? HIV infection ([place])   ? Nephrolithiasis   ? ? ?Social History  ? ?Tobacco Use  ? Smoking status: Some Days  ?  Packs/day: 1.00  ?  Years: 25.00  ?  Pack years: 25.00  ?  Types: E-cigarettes,  Cigarettes  ?  Last attempt to quit: 2013  ?  Years since quitting: 10.1  ? Smokeless tobacco: Never  ?Vaping Use  ? Vaping Use: Every day  ?Substance Use Topics  ? Alcohol use: Not Currently  ?  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 Drug use: Not Currently  ?  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 ? ?Family History  ?Problem Relation Age of Onset  ? COPD Mother   ? Diabetes Mother   ? Hypertension Mother   ? Hyperlipidemia Mother   ? Healthy Father   ? Cancer Father   ?     skin  ? Stroke Brother   ? Heart disease Brother   ? Seizures Brother   ? Thyroid disease Brother   ? Cancer Maternal Grandmother   ?     breast  ? Hyperlipidemia Maternal Grandmother   ? Hypertension Maternal Grandmother   ? Diabetes Maternal Grandfather   ? Hyperlipidemia Maternal Grandfather   ? Heart disease Paternal Grandfather   ? Hypertension Paternal Grandfather   ? ? ?Allergies  ?Allergen Reactions  ? Niacin Anaphylaxis  ? Orange Oil Anaphylaxis  ? ? ?Review of Systems  ?Constitutional: Negative.   ?Respiratory: Negative.    ?Cardiovascular: Negative.   ?Gastrointestinal: Negative.   ? ? ?OBJECTIVE:   ? ?Vitals:  ? 12/29/21  0916  ?BP: 123/82  ?Pulse: 67  ?Resp: 16  ?Temp: 98.2 ?F (36.8 ?C)  ?TempSrc: Oral  ?SpO2: 100%  ?Weight: 198 lb (89.8 kg)  ?Height: 5' 8"  (1.727 m)  ?   ?Body mass index is 30.11 kg/m?. ? ? ?Physical Exam ?Constitutional:   ?   General: He is not in acute distress. ?   Appearance: Normal appearance.  ?HENT:  ?   Head: Normocephalic and atraumatic.  ?Eyes:  ?   Extraocular Movements: Extraocular movements intact.  ?   Conjunctiva/sclera: Conjunctivae normal.  ?Pulmonary:  ?   Effort: Pulmonary effort is normal. No respiratory distress.  ?Skin: ?   General: Skin is warm and dry.  ?   Findings: No rash.  ?Neurological:  ?   General: No focal deficit present.  ?   Mental Status: He is alert and oriented to person, place, and time.  ?Psychiatric:     ?   Mood and Affect: Mood normal.     ?   Behavior: Behavior normal.  ? ? ?Labs and Microbiology: ?CMP Latest Ref Rng & Units 12/10/2021 01/22/2020 12/10/2019  ?Glucose 65 - 99 mg/dL 94 93 78  ?BUN 7 - 25 mg/dL 10 9 10   ?Creatinine 0.60 - 1.29 mg/dL 1.22 1.20 1.26  ?Sodium 135 - 146 mmol/L 139 142 143  ?Potassium 3.5 - 5.3 mmol/L 4.6 3.9 4.3  ?Chloride 98 - 110 mmol/L 102 110 109(H)  ?CO2 20 - 32 mmol/L 28 25 23   ?Calcium 8.6 - 10.3 mg/dL 10.0 9.4 9.7  ?Total Protein 6.1 - 8.1 g/dL 7.2 7.2 6.2  ?Total Bilirubin 0.2 - 1.2 mg/dL 0.6 0.8 0.5  ?Alkaline Phos 38 - 126 U/L - 44 52  ?AST 10 - 40 U/L 23 22 22   ?ALT 9 - 46 U/L 19 15 15   ? ?CBC Latest Ref Rng & Units 12/10/2021 01/22/2020 08/14/2019  ?WBC 3.8 - 10.8 Thousand/uL 5.9 7.4 5.8  ?Hemoglobin 13.2 - 17.1 g/dL 16.0 14.9 15.3  ?Hematocrit 38.5 - 50.0 % 47.1 43.6 44.9  ?Platelets 140 - 400 Thousand/uL 222 186 197  ?  ? ?  Lab Results  ?Component Value Date  ? HIV1RNAQUANT 23 (H) 12/10/2021  ? ? ?RPR and STI: ?Lab Results  ?Component Value Date  ? LABRPR NON-REACTIVE 12/10/2021  ? ? ?STI Results CT  ?Latest Ref Rng & Units Negative  ?10/07/2020 Negative  ? ? ?Hepatitis B: ?Lab Results  ?Component Value Date  ? HEPBSAB NON-REACTIVE  12/10/2021  ? HEPBSAG NON-REACTIVE 12/10/2021  ? HEPBCAB NON-REACTIVE 12/10/2021  ? ?Hepatitis C: ?Lab Results  ?Component Value Date  ? HEPCAB NON-REACTIVE 12/10/2021  ? ?Hepatitis A: ?Lab Results  ?Component Value Date  ? HAV NON-REACTIVE 12/10/2021  ? ?Lipids: ?Lab Results  ?Component Value Date  ? CHOL 176 12/10/2021  ? TRIG 86 12/10/2021  ? HDL 54 12/10/2021  ? CHOLHDL 3.3 12/10/2021  ? LDLCALC 104 (H) 12/10/2021  ? ? ? ?ASSESSMENT & PLAN:   ? ?HIV infection ([place]) ?Patient is currently on [place] which he has been on for several years.  He has good long term control of his HIV and recent labs here with viral load 23 copies and CD4 count 359 (30%).  Will continue daily Odefsey and provide refills today.  Patient also met with pharmacy today.  He has hx of gastric sleeve ~ 8 years ago so will have to consider absorption issues but for now seems to be working.  Advised to avoid PPI with Odefsey. RTC 6 months.   ? ?Vaccine counseling ?He is non-immune to hepatitis B.  Vaccine offered and patient accepts. ?He is also non-immune to hepatitis A and vaccine offered.  Patient accepts. ?Will provide Prevnar 20 at follow up. ? ?Routine screening for STI (sexually transmitted infection) ?Recent screening was negative from urine for GC/CT.  Will also check oral/rectal GC/CT as he reports oral and anal intercourse with intermittent condom use. ? ?Encounter for long-term current use of medication ?Creatinine, UA normal last month.  ? ? ?Orders Placed This Encounter  ?Procedures  ? CT/NG RNA, TMA Rectal  ?  Rectal swab  ? GC/CT Probe, Amp (Throat)  ?  Throat swab  ? ? ?Mignon Pine ?[place] for Infectious Disease ?[facility] ?12/29/2021, 9:48 AM ? ? ? ?

## 2021-12-29 NOTE — Addendum Note (Signed)
Addended by: Adelfa Koh on: 12/29/2021 10:27 AM ? ? Modules accepted: Orders ? ?

## 2021-12-29 NOTE — Patient Instructions (Signed)
Thank you for coming to see me today. It was a pleasure seeing you. ? ?To Do: ?Swabs today ?Continue Odefsey and refills sent ?Hepatitis A and B vaccine today ?Return in 4-6 weeks for 2nd hepatitis B shot and Prevnar 20 vaccine ?Follow up with me in 6 months.  ? ?If you have any questions or concerns, please do not hesitate to call the office at [phone]. ? ?Take Care,  ? ?Jule Ser ? ?

## 2021-12-29 NOTE — Assessment & Plan Note (Signed)
Recent screening was negative from urine for GC/CT.  Will also check oral/rectal GC/CT as he reports oral and anal intercourse with intermittent condom use. ?

## 2021-12-29 NOTE — Progress Notes (Signed)
? ?HPI: Troy [name] is a 51 y.o. male who presents to the [facility] to establish care from an outside clinic. ? ?Patient Active Problem List  ? Diagnosis Date Noted  ? Routine screening for STI (sexually transmitted infection) 12/29/2021  ? Vaccine counseling 12/29/2021  ? Encounter for long-term current use of medication 12/29/2021  ? Pain with ejaculation 10/05/2020  ? Chronic bilateral low back pain without sciatica 10/05/2020  ? Need for influenza vaccination 10/05/2020  ? Lower urinary tract symptoms (LUTS) 11/26/2019  ? Gastroesophageal reflux disease with esophagitis without hemorrhage   ? Chronic constipation 09/03/2019  ? Nephrolithiasis 06/14/2019  ? Urinary urgency 06/14/2019  ? Allergic rhinitis 02/13/2019  ? Asthma 02/11/2019  ? Dyslipidemia 02/11/2019  ? HIV infection ([place]) 02/11/2019  ? Kidney stones, calcium oxalate 02/11/2019  ? History of CVA with residual deficit   ? Bipolar 1 disorder ([place])   ? Overweight 01/13/2015  ? 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 S/P bariatric surgery 06/20/2013  ? Insomnia 12/09/2011  ? Migraine headache 09/28/2011  ? ? ?Patient's Medications  ?New Prescriptions  ? No medications on file  ?Previous Medications  ? ALBUTEROL (VENTOLIN HFA) 108 (90 BASE) MCG/ACT INHALER    Inhale 1-2 puffs into the lungs every 6 (six) hours as needed for wheezing or shortness of breath.  ? ALPRAZOLAM (XANAX) 1 MG TABLET    Take 1 mg by mouth daily as needed.  ? AMITIZA 24 MCG CAPSULE    TAKE 1 CAPSULE (24 MCG TOTAL) BY MOUTH 2 (TWO) TIMES DAILY WITH A MEAL.  ? ARIPIPRAZOLE (ABILIFY) 20 MG TABLET    Take 20 mg by mouth at bedtime.  ? BUSPIRONE (BUSPAR) 30 MG TABLET    Take 30 mg by mouth 2 (two) times daily.  ? CETIRIZINE (ZYRTEC) 10 MG TABLET    TAKE 1 TABLET BY MOUTH EVERY DAY  ? DOXEPIN HCL 6 MG TABS    Take 6 mg by mouth daily.  ? MONTELUKAST (SINGULAIR) 10 MG TABLET    TAKE 1 TABLET BY MOUTH EVERY DAY  ? MUPIROCIN OINTMENT (BACTROBAN) 2 %    Apply 1 application  topically 2 (two) times daily. Apply thin layer only.  ? OLANZAPINE (ZYPREXA) 5 MG TABLET    Take by mouth.  ? OMEPRAZOLE (PRILOSEC) 40 MG CAPSULE    TAKE 1 CAPSULE BY MOUTH TWICE A DAY *MAX PER INSURANCE  ? ONDANSETRON (ZOFRAN ODT) 4 MG DISINTEGRATING TABLET    Take 1 tablet (4 mg total) by mouth every 8 (eight) hours as needed.  ? SUMATRIPTAN (IMITREX) 100 MG TABLET    TAKE ONE TABLET BY MOUTH AT ONSET OF THE HEADACHE. MAY REPEAT IN TWO HOURS  ? TIZANIDINE (ZANAFLEX) 4 MG CAPSULE    TAKE 1 CAPSULE BY MOUTH 2 TIMES DAILY.  ? TOPIRAMATE (TOPAMAX) 100 MG TABLET    TAKE 1 TABLET BY MOUTH TWICE A DAY  ? TRAZODONE (DESYREL) 100 MG TABLET    Take 100 mg by mouth 2 (two) times a day.  ? TROSPIUM CHLORIDE 60 MG CP24    Take 1 capsule (60 mg total) by mouth daily.  ?Modified Medications  ? Modified Medication Previous Medication  ? ODEFSEY 200-25-25 MG TABS TABLET ODEFSEY 200-25-25 MG TABS tablet  ?    Take 1 tablet by mouth daily.      ?Discontinued Medications  ? No medications on file  ? ? ?Allergies: ?Allergies  ?Allergen Reactions  ? Niacin Anaphylaxis  ?  Orange Oil Anaphylaxis  ? ? ?Past Medical History: ?Past Medical History:  ?Diagnosis Date  ? Asthma   ? Bipolar 1 disorder ([place])   ? Diabetes mellitus without complication ([place])   ? History of CVA with residual deficit   ? HIV infection ([place])   ? Nephrolithiasis   ? ? ?Social History: ?Social History  ? ?Socioeconomic History  ? Marital status: Single  ?  Spouse name: Not on file  ? Number of children: 0  ? Years of education: Not on file  ? Highest education level: Not on file  ?Occupational History  ? Occupation: Radio broadcast assistant  ?  Employer: [employer]  ?Tobacco Use  ? Smoking status: Some Days  ?  Packs/day: 1.00  ?  Years: 25.00  ?  Pack years: 25.00  ?  Types: E-cigarettes, Cigarettes  ?  Last attempt to quit: 2013  ?  Years since quitting: 10.1  ? Smokeless tobacco: Never  ?Vaping Use  ? Vaping Use: Every day  ?Substance and Sexual Activity  ? Alcohol use: Not  Currently  ?  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 Drug use: Not Currently  ?  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 Sexual activity: Not Currently  ?  Partners: Male  ?  Comment: patient given condoms  ?Other Topics Concern  ? Not on file  ?Social History Narrative  ? Not on file  ? ?Social Determinants of Health  ? ?Financial Resource Strain: Not on file  ?Food Insecurity: Not on file  ?Transportation Needs: Not on file  ?Physical Activity: Not on file  ?Stress: Not on file  ?Social Connections: Not on file  ? ? ?Labs: ?Lab Results  ?Component Value Date  ? HIV1RNAQUANT 23 (H) 12/10/2021  ? ? ?RPR and STI ?Lab Results  ?Component Value Date  ? LABRPR NON-REACTIVE 12/10/2021  ? ? ?STI Results CT  ?Latest Ref Rng & Units Negative  ?10/07/2020 Negative  ? ? ?Hepatitis B ?Lab Results  ?Component Value Date  ? HEPBSAB NON-REACTIVE 12/10/2021  ? HEPBSAG NON-REACTIVE 12/10/2021  ? HEPBCAB NON-REACTIVE 12/10/2021  ? ?Hepatitis C ?Lab Results  ?Component Value Date  ? HEPCAB NON-REACTIVE 12/10/2021  ? ?Hepatitis A ?Lab Results  ?Component Value Date  ? HAV NON-REACTIVE 12/10/2021  ? ?Lipids: ?Lab Results  ?Component Value Date  ? CHOL 176 12/10/2021  ? TRIG 86 12/10/2021  ? HDL 54 12/10/2021  ? CHOLHDL 3.3 12/10/2021  ? LDLCALC 104 (H) 12/10/2021  ? ? ?Current HIV Regimen: ?Odefsey 200-25-25 mg one tablet every night ? ?Assessment: ?He presents today to establish care at [facility] after moving from [place] area. He is currently uninsured and denies taking any medications aside from [place]. PMH of gastric bypass surgery approximately 8-9 years ago. Current viral loads suggest absorption of Odefsey not impacted. Pharmacy will continue to monitor VL, if this changes can consider a change in regimen such as Truvada/Tivicay. Referred patient to [facility] for PCP and counseled on notifying pharmacy on starting new medications due to drug interactions. ? ?He noted he is interested in an  injection and not taking a daily pill. Counseled on the importance of adherence to clinic and speaking to Dr. Juleen China after doing some research in the interim. Counseled that [place] is two separate intramuscular injections in the gluteal muscle on each side for each visit. Explained that the second injection is 30 days after the initial injection then every 2 months thereafter.  Discussed the need for viral load monitoring every 2 months for the first 6 months and then periodically afterwards as their provider sees the need. Discussed the rare but significant chance of developing resistance despite compliance. Explained that showing up to injection appointments is very important and warned that if 2 appointments are missed, it will be reassessed by their provider whether they are a good candidate for injection therapy. Counseled on possible side effects associated with the injections such as injection site pain, which is usually mild to moderate in nature, injection site nodules, and injection site reactions. Asked to call the clinic or send me a mychart message if they experience any issues, such as fatigue, nausea, headache, rash, or dizziness. Advised that they can take ibuprofen or tylenol for injection site pain if needed.  ? ?Plan: ?- Continue Odefsey 200-25-25 mg one tablet PO every night ?- Consider transition to [place] at follow/up visit ?- Referred patient to [facility] for PCP medications ?- Call with any issues or questions ? ?Thank you for allowing pharmacy to be apart of this patient's care ? ?Asher Muir, PharmD Candidate ? ? ?

## 2021-12-29 NOTE — Assessment & Plan Note (Signed)
He is non-immune to hepatitis B.  Vaccine offered and patient accepts. ?He is also non-immune to hepatitis A and vaccine offered.  Patient accepts. ?Will provide Prevnar 20 at follow up. ?

## 2021-12-29 NOTE — Assessment & Plan Note (Signed)
Creatinine, UA normal last month.  ?

## 2021-12-30 LAB — GC/CHLAMYDIA PROBE, AMP (THROAT)
Chlamydia trachomatis RNA: NOT DETECTED
Neisseria gonorrhoeae RNA: NOT DETECTED

## 2021-12-30 LAB — CT/NG RNA, TMA RECTAL
Chlamydia Trachomatis RNA: NOT DETECTED
Neisseria Gonorrhoeae RNA: NOT DETECTED

## 2022-02-02 NOTE — Progress Notes (Signed)
?  ? ?I,Sulibeya S Dimas,acting as a scribe for Lavon Paganini, MD.,have documented all relevant documentation on the behalf of Lavon Paganini, MD,as directed by  Lavon Paganini, MD while in the presence of Lavon Paganini, MD. ? ? ?Established patient visit ? ? ?Patient: Troy [name]   DOB: 04-15-1971   51 y.o. Male  MRN: [id number] ?Visit Date: 02/03/2022 ? ?Today's healthcare provider: Lavon Paganini, MD  ? ?Chief Complaint  ?Patient presents with  ? Allergies  ? Migraine  ? ?Subjective  ?  ?HPI  ?Follow up for allergies ? ?The patient was last seen for this 1 years ago. ?Changes made at last visit include patient reports he lost his insurance last year.  ? ?Patient has been off all medications for all conditions due to loss of insurance. ? ?Patient requesting refills.  ? ?----------------------------------------------------------------------------------------- ?Patient reports he is getting migraine headaches at least 14 days out of the month.  ? ?Has been sober for 6 years!  Despite all the stressors of the last year after moving to [place] and losing everything and being homeless without medical care ? ?Medications: ?Outpatient Medications Prior to Visit  ?Medication Sig  ? ODEFSEY 200-25-25 MG TABS tablet Take 1 tablet by mouth daily.  ? ALPRAZolam (XANAX) 1 MG tablet Take 1 mg by mouth daily as needed. (Patient not taking: Reported on 12/29/2021)  ? ARIPiprazole (ABILIFY) 20 MG tablet Take 20 mg by mouth at bedtime. (Patient not taking: Reported on 12/29/2021)  ? busPIRone (BUSPAR) 30 MG tablet Take 30 mg by mouth 2 (two) times daily. (Patient not taking: Reported on 12/29/2021)  ? Doxepin HCl 6 MG TABS Take 6 mg by mouth daily. (Patient not taking: Reported on 12/29/2021)  ? OLANZapine (ZYPREXA) 5 MG tablet Take by mouth. (Patient not taking: Reported on 12/29/2021)  ? traZODone (DESYREL) 100 MG tablet Take 100 mg by mouth 2 (two) times a day. (Patient not taking: Reported on 12/29/2021)  ?  [DISCONTINUED] albuterol (VENTOLIN HFA) 108 (90 Base) MCG/ACT inhaler Inhale 1-2 puffs into the lungs every 6 (six) hours as needed for wheezing or shortness of breath. (Patient not taking: Reported on 12/29/2021)  ? [DISCONTINUED] AMITIZA 24 MCG capsule TAKE 1 CAPSULE (24 MCG TOTAL) BY MOUTH 2 (TWO) TIMES DAILY WITH A MEAL. (Patient not taking: Reported on 12/29/2021)  ? [DISCONTINUED] cetirizine (ZYRTEC) 10 MG tablet TAKE 1 TABLET BY MOUTH EVERY DAY (Patient not taking: Reported on 12/29/2021)  ? [DISCONTINUED] montelukast (SINGULAIR) 10 MG tablet TAKE 1 TABLET BY MOUTH EVERY DAY (Patient not taking: Reported on 12/29/2021)  ? [DISCONTINUED] mupirocin ointment (BACTROBAN) 2 % Apply 1 application topically 2 (two) times daily. Apply thin layer only. (Patient not taking: Reported on 12/29/2021)  ? [DISCONTINUED] omeprazole (PRILOSEC) 40 MG capsule TAKE 1 CAPSULE BY MOUTH TWICE A DAY *MAX PER INSURANCE (Patient not taking: Reported on 12/29/2021)  ? [DISCONTINUED] ondansetron (ZOFRAN ODT) 4 MG disintegrating tablet Take 1 tablet (4 mg total) by mouth every 8 (eight) hours as needed. (Patient not taking: Reported on 12/29/2021)  ? [DISCONTINUED] SUMAtriptan (IMITREX) 100 MG tablet TAKE ONE TABLET BY MOUTH AT ONSET OF THE HEADACHE. MAY REPEAT IN TWO HOURS (Patient not taking: Reported on 12/29/2021)  ? [DISCONTINUED] tiZANidine (ZANAFLEX) 4 MG capsule TAKE 1 CAPSULE BY MOUTH 2 TIMES DAILY. (Patient not taking: Reported on 12/29/2021)  ? [DISCONTINUED] topiramate (TOPAMAX) 100 MG tablet TAKE 1 TABLET BY MOUTH TWICE A DAY (Patient not taking: Reported on 12/29/2021)  ? [DISCONTINUED] Trospium Chloride 60 MG  CP24 Take 1 capsule (60 mg total) by mouth daily. (Patient not taking: Reported on 12/29/2021)  ? ?No facility-administered medications prior to visit.  ? ? ?Review of Systems per HPI ? ? ?  Objective  ?  ?BP 104/70 (BP Location: Left Arm, Patient Position: Sitting, Cuff Size: Large)   Pulse (!) 109   Temp 98.2 ?F (36.8 ?C) (Oral)    Resp 16   Ht 5' 8"  (1.727 m)   Wt 186 lb 8 oz (84.6 kg)   SpO2 97%   BMI 28.36 kg/m?  ? ? ?Physical Exam ?Vitals reviewed.  ?Constitutional:   ?   General: He is not in acute distress. ?   Appearance: Normal appearance. He is not diaphoretic.  ?HENT:  ?   Head: Normocephalic and atraumatic.  ?Eyes:  ?   General: No scleral icterus. ?   Conjunctiva/sclera: Conjunctivae normal.  ?Cardiovascular:  ?   Rate and Rhythm: Normal rate and regular rhythm.  ?   Pulses: Normal pulses.  ?   Heart sounds: Normal heart sounds. No murmur heard. ?Pulmonary:  ?   Effort: Pulmonary effort is normal. No respiratory distress.  ?   Breath sounds: Wheezing present. No rhonchi.  ?Abdominal:  ?   General: There is no distension.  ?   Palpations: Abdomen is soft.  ?   Tenderness: There is no abdominal tenderness.  ?Musculoskeletal:  ?   Cervical back: Neck supple.  ?   Right lower leg: No edema.  ?   Left lower leg: No edema.  ?Lymphadenopathy:  ?   Cervical: No cervical adenopathy.  ?Skin: ?   General: Skin is warm and dry.  ?Neurological:  ?   Mental Status: He is alert and oriented to person, place, and time.  ?   Cranial Nerves: No cranial nerve deficit.  ?Psychiatric:     ?   Mood and Affect: Mood normal.     ?   Behavior: Behavior normal.  ?  ? ? ?No results found for any visits on 02/03/22. ? Assessment & Plan  ?  ? ?Problem List Items Addressed This Visit   ? ?  ? Cardiovascular and Mediastinum  ? Migraine headache  ?  Chronic and uncontrolled ?Previously well controlled on topamax and prn tizanidine ?Take sumatriptan prn ?Will resume previous meds ?  ?  ? Relevant Medications  ? topiramate (TOPAMAX) 100 MG tablet  ? tiZANidine (ZANAFLEX) 4 MG capsule  ? SUMAtriptan (IMITREX) 100 MG tablet  ?  ? Respiratory  ? Asthma  ?  Mild and intermittent ?Recent wheezing 2/2 allergies and being out of all meds ?Resume singulair and prn albuterol ?  ?  ? Relevant Medications  ? albuterol (VENTOLIN HFA) 108 (90 Base) MCG/ACT inhaler  ?  montelukast (SINGULAIR) 10 MG tablet  ? Allergic rhinitis  ?  Chronic and uncontrolled ?Resume zyrtec and singulair  ?  ?  ?  ? Other  ? Crohn's disease without complication ([place])  ?  Was previously diagnosed >15 years ago, but this was questioned on last colonscopy ?Now having daily diarrhea x3 m ?Not on amitiza - will not resume ?No blood in stool ?Referral to re-establish with GI ?  ?  ? Relevant Orders  ? Ambulatory referral to Gastroenterology  ? Dyslipidemia - Primary  ?  Reviewed last lipid panel - well controlled ?Not currently on a statin ?Discussed diet and exercise  ?  ?  ? Bipolar 1 disorder ([place])  ?  Was  previously f/b psych in [place] ?Needs to establish with new psych ?Out of all meds currently, but denies any mania or depression ?Referral placed today ?  ?  ? Relevant Orders  ? Ambulatory referral to Psychiatry  ?  ? ?Return in about 3 months (around 05/05/2022) for CPE.  ?   ? ?I, Lavon Paganini, MD, have reviewed all documentation for this visit. The documentation on 02/03/22 for the exam, diagnosis, procedures, and orders are all accurate and complete. ? ? ?Virginia Crews, MD, MPH ?[facility]   ?

## 2022-02-03 ENCOUNTER — Ambulatory Visit (INDEPENDENT_AMBULATORY_CARE_PROVIDER_SITE_OTHER): Payer: 59 | Admitting: Family Medicine

## 2022-02-03 ENCOUNTER — Encounter: Payer: Self-pay | Admitting: Family Medicine

## 2022-02-03 VITALS — BP 104/70 | HR 109 | Temp 98.2°F | Resp 16 | Ht 68.0 in | Wt 186.5 lb

## 2022-02-03 DIAGNOSIS — E785 Hyperlipidemia, unspecified: Secondary | ICD-10-CM | POA: Diagnosis not present

## 2022-02-03 DIAGNOSIS — K5090 Crohn's disease, unspecified, without complications: Secondary | ICD-10-CM

## 2022-02-03 DIAGNOSIS — J4520 Mild intermittent asthma, uncomplicated: Secondary | ICD-10-CM

## 2022-02-03 DIAGNOSIS — G43709 Chronic migraine without aura, not intractable, without status migrainosus: Secondary | ICD-10-CM | POA: Diagnosis not present

## 2022-02-03 DIAGNOSIS — F319 Bipolar disorder, unspecified: Secondary | ICD-10-CM

## 2022-02-03 DIAGNOSIS — J309 Allergic rhinitis, unspecified: Secondary | ICD-10-CM

## 2022-02-03 MED ORDER — TIZANIDINE HCL 4 MG PO CAPS: 4.0000 mg | ORAL_CAPSULE | Freq: Two times a day (BID) | ORAL | 1 refills | Status: DC

## 2022-02-03 MED ORDER — TOPIRAMATE 100 MG PO TABS: 100.0000 mg | ORAL_TABLET | Freq: Two times a day (BID) | ORAL | 3 refills | Status: DC

## 2022-02-03 MED ORDER — ALBUTEROL SULFATE HFA 108 (90 BASE) MCG/ACT IN AERS
1.0000 | INHALATION_SPRAY | Freq: Four times a day (QID) | RESPIRATORY_TRACT | 3 refills | Status: DC | PRN
Start: 2022-02-03 — End: 2022-07-20

## 2022-02-03 MED ORDER — MONTELUKAST SODIUM 10 MG PO TABS: 10.0000 mg | ORAL_TABLET | Freq: Every day | ORAL | 3 refills | Status: DC

## 2022-02-03 MED ORDER — CETIRIZINE HCL 10 MG PO TABS: 10.0000 mg | ORAL_TABLET | Freq: Every day | ORAL | 3 refills | Status: DC

## 2022-02-03 MED ORDER — ONDANSETRON 4 MG PO TBDP: 4.0000 mg | ORAL_TABLET | Freq: Three times a day (TID) | ORAL | 0 refills | Status: DC | PRN

## 2022-02-03 MED ORDER — SUMATRIPTAN SUCCINATE 100 MG PO TABS: ORAL_TABLET | ORAL | 3 refills | Status: DC

## 2022-02-03 NOTE — Assessment & Plan Note (Signed)
Was previously diagnosed >15 years ago, but this was questioned on last colonscopy ?Now having daily diarrhea x3 m ?Not on amitiza - will not resume ?No blood in stool ?Referral to re-establish with GI ?

## 2022-02-03 NOTE — Assessment & Plan Note (Signed)
Mild and intermittent ?Recent wheezing 2/2 allergies and being out of all meds ?Resume singulair and prn albuterol ?

## 2022-02-03 NOTE — Assessment & Plan Note (Signed)
Reviewed last lipid panel - well controlled ?Not currently on a statin ?Discussed diet and exercise  ?

## 2022-02-03 NOTE — Assessment & Plan Note (Signed)
Was previously f/b psych in [place] ?Needs to establish with new psych ?Out of all meds currently, but denies any mania or depression ?Referral placed today ?

## 2022-02-03 NOTE — Assessment & Plan Note (Signed)
Chronic and uncontrolled ?Resume zyrtec and singulair  ?

## 2022-02-03 NOTE — Assessment & Plan Note (Signed)
Chronic and uncontrolled ?Previously well controlled on topamax and prn tizanidine ?Take sumatriptan prn ?Will resume previous meds ?

## 2022-02-09 ENCOUNTER — Ambulatory Visit: Payer: Medicaid Other

## 2022-02-09 ENCOUNTER — Other Ambulatory Visit: Payer: Self-pay

## 2022-02-09 ENCOUNTER — Ambulatory Visit (INDEPENDENT_AMBULATORY_CARE_PROVIDER_SITE_OTHER): Payer: Self-pay

## 2022-02-09 DIAGNOSIS — Z113 Encounter for screening for infections with a predominantly sexual mode of transmission: Secondary | ICD-10-CM

## 2022-02-09 DIAGNOSIS — Z23 Encounter for immunization: Secondary | ICD-10-CM

## 2022-02-16 ENCOUNTER — Telehealth: Payer: Self-pay | Admitting: Family Medicine

## 2022-02-16 NOTE — Telephone Encounter (Signed)
Copied from [place] [id number]. Topic: General - Other ?>> Feb 16, 2022  3:51 PM Leward Quan A wrote: ?Reason for CRM: Patient called in to inquire of Dr B what happen to the referral to the Psychiatrist he has been waiting for since his last visit. Patient ask for a call back at Ph# [phone] ?

## 2022-02-17 NOTE — Telephone Encounter (Signed)
Please advise referral? ?

## 2022-02-22 NOTE — Telephone Encounter (Signed)
Referral has been sent to [facility]. I left message for pt to return call to advise. He will need to contact them at Phone: [phone] to schedule ?

## 2022-03-08 NOTE — Telephone Encounter (Signed)
[facility] called [facility] and states would not even talk to her states they need the last 2 appt notes of office visits and the referral #. Pt states that this has been ongoing a month and she does not feel so great and wants this to be escalated more urgent. Pls fu with pt to advise [phone] ?

## 2022-03-08 NOTE — Telephone Encounter (Signed)
Spoke to Psychiatric office and they have referral and just need patient to call them. Patient advised to call [phone].  ?
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
## 2022-04-13 ENCOUNTER — Encounter: Payer: Self-pay | Admitting: Psychiatry

## 2022-04-13 ENCOUNTER — Ambulatory Visit (INDEPENDENT_AMBULATORY_CARE_PROVIDER_SITE_OTHER): Payer: 59 | Admitting: Psychiatry

## 2022-04-13 DIAGNOSIS — F1091 Alcohol use, unspecified, in remission: Secondary | ICD-10-CM

## 2022-04-13 DIAGNOSIS — F3132 Bipolar disorder, current episode depressed, moderate: Secondary | ICD-10-CM

## 2022-04-13 DIAGNOSIS — G4700 Insomnia, unspecified: Secondary | ICD-10-CM

## 2022-04-13 MED ORDER — ARIPIPRAZOLE 5 MG PO TABS: 5.0000 mg | ORAL_TABLET | Freq: Every day | ORAL | 1 refills | Status: DC

## 2022-04-13 MED ORDER — TRAZODONE HCL 50 MG PO TABS: 25.0000 mg | ORAL_TABLET | Freq: Every evening | ORAL | 1 refills | Status: DC | PRN

## 2022-04-13 NOTE — Patient Instructions (Signed)
Start Abilify 5 mg at night  Start Trazodone 25-50 mg at night as needed for insomnia Next appointment: 7/19 at 2:30, video

## 2022-05-03 ENCOUNTER — Telehealth: Payer: Self-pay

## 2022-05-03 NOTE — Telephone Encounter (Signed)
Noted, will hold this medication then until his next visit.

## 2022-05-03 NOTE — Telephone Encounter (Signed)
pt called states that he trown away the trazodone by mistake. he states that it wasn't working anyway for him.  (he has an appt on 05-11-22)

## 2022-05-04 NOTE — Progress Notes (Signed)
I,Avari Gelles Robinson,acting as a Education administrator for [employer], PA-C.,have documented all relevant documentation on the behalf of Mardene Speak, PA-C,as directed by  [employer], PA-C while in the presence of [employer], PA-C.  Complete physical exam   Patient: Troy [name]   DOB: 02/15/1971   51 y.o. Male  MRN: [id number] Visit Date: 05/05/2022  Today's healthcare provider: Mardene Speak, PA-C   Chief Complaint  Patient presents with   Annual Exam   Subjective    Troy [name] is a 51 y.o. male who presents today for a complete physical exam.  He reports consuming a general diet. The patient has a physically strenuous job, but has no regular exercise apart from work.  He generally feels well. He reports sleeping poorly. He does have additional problems to discuss today.  Patient states Topamax is not working this time. Also, neither current dose of Abilify nor Trazodone. Resumed 2 months ago per patient.  Wonders if there's a new medication he can try for migraine   Recent unintentional weight loss. 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     Past Medical History:  Diagnosis Date   Asthma    Bipolar 1 disorder ([place])    Diabetes mellitus without complication ([place])    History of CVA with residual deficit    HIV infection ([place])    Nephrolithiasis    Past Surgical History:  Procedure Laterality Date   COLONOSCOPY     COLONOSCOPY WITH PROPOFOL N/A 05/30/2019   Procedure: COLONOSCOPY WITH PROPOFOL;  Surgeon: Lin Landsman, MD;  Location: [facility] ENDOSCOPY;  Service: Gastroenterology;  Laterality: N/A;   ELBOW SURGERY Left    fracture   ESOPHAGOGASTRODUODENOSCOPY (EGD) WITH PROPOFOL N/A 05/30/2019   Procedure: ESOPHAGOGASTRODUODENOSCOPY (EGD) WITH PROPOFOL;  Surgeon: Lin Landsman, MD;  Location: [place];  Service: Gastroenterology;  Laterality: N/A;   ESOPHAGOGASTRODUODENOSCOPY (EGD) WITH PROPOFOL N/A 09/12/2019   Procedure: ESOPHAGOGASTRODUODENOSCOPY (EGD) WITH  PROPOFOL;  Surgeon: Lin Landsman, MD;  Location: [facility] ENDOSCOPY;  Service: Gastroenterology;  Laterality: N/A;   FACIAL COSMETIC SURGERY     GASTRIC BYPASS     HERNIA REPAIR     KIDNEY STONE SURGERY     renal artery repair     Social History   Socioeconomic History   Marital status: Single    Spouse name: Not on file   Number of children: 0   Years of education: Not on file   Highest education level: Not on file  Occupational History   Occupation: Radio broadcast assistant    Employer: [employer]  Tobacco Use   Smoking status: Former    Packs/day: 1.00    Years: 25.00    Total pack years: 25.00    Types: E-cigarettes, Cigarettes    Quit date: 2013    Years since quitting: 10.5   Smokeless tobacco: Never  Vaping Use   Vaping Use: Every day  Substance and Sexual Activity   Alcohol use: Not Currently    Comment: 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, active in [place]   Drug use: Not Currently    Comment: previously used, no IV drugs, 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   Sexual activity: Not Currently    Partners: Male    Comment: patient given condoms  Other Topics Concern   Not on file  Social History Narrative   Not on file   Social Determinants of Health   Financial Resource Strain: Not on file  Food Insecurity: Not on file  Transportation Needs: Not on file  Physical Activity: Not on file  Stress: Not on file  Social Connections: Not on file  Intimate Partner Violence: Not on file   Family Status  Relation Name Status   Mother  Alive   Father  Alive   Brother  Deceased   MGF  Deceased   MGM  Alive   PGF  Deceased   [place]  Deceased   Family History  Problem Relation Age of Onset   Bipolar disorder Mother    COPD Mother    Diabetes Mother    Hypertension Mother    Hyperlipidemia Mother    Healthy Father    Cancer Father        skin   Stroke Brother    Heart disease Brother    Seizures Brother    Thyroid disease Brother    Diabetes Maternal Grandfather    Hyperlipidemia Maternal Grandfather     Cancer Maternal Grandmother        breast   Hyperlipidemia Maternal Grandmother    Hypertension Maternal Grandmother    Heart disease Paternal Grandfather    Hypertension Paternal Grandfather    Allergies  Allergen Reactions   Niacin Anaphylaxis   Orange Oil Anaphylaxis   Zolpidem     Other reaction(s): Unknown Sleep walking and sleep eating Other reaction(s): Unknown Sleep walking and sleep eating    Patient Care Team: Virginia Crews, MD as PCP - General (Family Medicine)   Medications: Outpatient Medications Prior to Visit  Medication Sig   albuterol (VENTOLIN HFA) 108 (90 Base) MCG/ACT inhaler Inhale 1-2 puffs into the lungs every 6 (six) hours as needed for wheezing or shortness of breath.   ARIPiprazole (ABILIFY) 5 MG tablet Take 1 tablet (5 mg total) by mouth at bedtime.   cetirizine (ZYRTEC) 10 MG tablet Take 1 tablet (10 mg total) by mouth daily.   montelukast (SINGULAIR) 10 MG tablet Take 1 tablet (10 mg total) by mouth daily.   ODEFSEY 200-25-25 MG TABS tablet Take 1 tablet by mouth daily.   ondansetron (ZOFRAN ODT) 4 MG disintegrating tablet Take 1 tablet (4 mg total) by mouth every 8 (eight) hours as needed.   SUMAtriptan (IMITREX) 100 MG tablet TAKE ONE TABLET BY MOUTH AT ONSET OF THE HEADACHE. MAY REPEAT IN TWO HOURS   tiZANidine (ZANAFLEX) 4 MG capsule Take 1 capsule (4 mg total) by mouth 2 (two) times daily.   topiramate (TOPAMAX) 100 MG tablet Take 1 tablet (100 mg total) by mouth 2 (two) times daily.   traZODone (DESYREL) 50 MG tablet Take 0.5-1 tablets (25-50 mg total) by mouth at bedtime as needed for sleep.   No facility-administered medications prior to visit.    Review of Systems  Constitutional:  Positive for appetite change, fatigue and unexpected weight change.  Endocrine: Positive for polydipsia.  Genitourinary:  Positive for frequency.  Musculoskeletal:  Positive for arthralgias and back pain.  Neurological:  Positive for headaches.   Psychiatric/Behavioral:  Positive for sleep disturbance. The patient is nervous/anxious.     Last CBC Lab Results  Component Value Date   WBC 5.9 12/10/2021   HGB 16.0 12/10/2021   HCT 47.1 12/10/2021   MCV 89.5 12/10/2021   MCH 30.4 12/10/2021   RDW 12.1 12/10/2021   PLT 222 05/14/8287   Last metabolic panel Lab Results  Component Value Date   GLUCOSE 94 12/10/2021   NA 139 12/10/2021   K 4.6 12/10/2021   CL 102 12/10/2021   CO2 28 12/10/2021  BUN 10 12/10/2021   CREATININE 1.22 12/10/2021   EGFR 73 12/10/2021   CALCIUM 10.0 12/10/2021   PROT 7.2 12/10/2021   ALBUMIN 4.5 01/22/2020   LABGLOB 2.0 12/10/2019   AGRATIO 2.1 12/10/2019   BILITOT 0.6 12/10/2021   ALKPHOS 44 01/22/2020   AST 23 12/10/2021   ALT 19 12/10/2021   ANIONGAP 7 01/22/2020   Last lipids Lab Results  Component Value Date   CHOL 176 12/10/2021   HDL 54 12/10/2021   LDLCALC 104 (H) 12/10/2021   TRIG 86 12/10/2021   CHOLHDL 3.3 12/10/2021   Last hemoglobin A1c Lab Results  Component Value Date   HGBA1C 5.1 05/21/2020      Objective     BP 103/74 (BP Location: Left Arm, Patient Position: Sitting, Cuff Size: Normal)   Pulse 85   Temp 98.1 F (36.7 C) (Oral)   Resp 16   Ht 5' 8"  (1.727 m)   Wt 170 lb 9.6 oz (77.4 kg)   SpO2 98%   BMI 25.94 kg/m  BP Readings from Last 3 Encounters:  05/05/22 103/74  02/03/22 104/70  12/29/21 123/82   Wt Readings from Last 3 Encounters:  05/05/22 170 lb 9.6 oz (77.4 kg)  02/03/22 186 lb 8 oz (84.6 kg)  12/29/21 198 lb (89.8 kg)       Physical Exam Vitals reviewed.  Constitutional:      General: He is not in acute distress.    Appearance: Normal appearance. He is well-developed and normal weight. He is not diaphoretic.  HENT:     Head: Normocephalic and atraumatic.     Right Ear: Tympanic membrane, ear canal and external ear normal.     Left Ear: Tympanic membrane, ear canal and external ear normal.     Nose: Congestion and rhinorrhea  present.     Mouth/Throat:     Mouth: Mucous membranes are moist.     Pharynx: Oropharynx is clear. No oropharyngeal exudate or posterior oropharyngeal erythema.     Comments: Pt uses a glue for dentures Eyes:     General: No scleral icterus.       Right eye: No discharge.        Left eye: No discharge.     Conjunctiva/sclera: Conjunctivae normal.     Pupils: Pupils are equal, round, and reactive to light.  Neck:     Thyroid: No thyromegaly.  Cardiovascular:     Rate and Rhythm: Normal rate and regular rhythm.     Pulses: Normal pulses.     Heart sounds: Normal heart sounds. No murmur heard. Pulmonary:     Effort: Pulmonary effort is normal. No respiratory distress.     Breath sounds: Normal breath sounds. No wheezing or rales.  Abdominal:     General: Abdomen is flat. Bowel sounds are normal. There is no distension.     Palpations: Abdomen is soft. There is no mass.     Tenderness: There is no abdominal tenderness. There is no right CVA tenderness, left CVA tenderness, guarding or rebound.     Hernia: No hernia is present.  Musculoskeletal:        General: No swelling, deformity or signs of injury. Normal range of motion.     Cervical back: Normal range of motion and neck supple. No rigidity or tenderness.     Right lower leg: No edema.     Left lower leg: No edema.  Lymphadenopathy:     Cervical: No cervical adenopathy.  Skin:  General: Skin is warm and dry.     Capillary Refill: Capillary refill takes less than 2 seconds.     Findings: No rash.  Neurological:     General: No focal deficit present.     Mental Status: He is alert and oriented to person, place, and time. Mental status is at baseline.     Cranial Nerves: No cranial nerve deficit.     Sensory: No sensory deficit.     Motor: No weakness.     Coordination: Coordination normal.     Gait: Gait normal.     Deep Tendon Reflexes: Reflexes normal.  Psychiatric:        Mood and Affect: Mood normal.         Behavior: Behavior normal.        Thought Content: Thought content normal.        Judgment: Judgment normal.     Last depression screening scores    04/13/2022   11:41 AM 02/03/2022    2:36 PM 12/29/2021    9:21 AM  PHQ 2/9 Scores  PHQ - 2 Score  4 2  PHQ- 9 Score  19 11     Information is confidential and restricted. Go to Review Flowsheets to unlock data.   Last fall risk screening    02/03/2022    2:36 PM  [place] in the past year? 0  Number falls in past yr: 0  Injury with Fall? 0  Risk for fall due to : No Fall Risks  Follow up Falls evaluation completed   Last Audit-C alcohol use screening    02/03/2022    2:36 PM  Alcohol Use Disorder Test (AUDIT)  1. How often do you have a drink containing alcohol? 0  2. How many drinks containing alcohol do you have on a typical day when you are drinking? 0  3. How often do you have six or more drinks on one occasion? 0  AUDIT-C Score 0   A score of 3 or more in women, and 4 or more in men indicates increased risk for alcohol abuse, EXCEPT if all of the points are from question 1   No results found for any visits on 05/05/22.  Assessment & Plan    Routine Health Maintenance and Physical Exam  Exercise Activities and Dietary recommendations  Goals   None     Immunization History  Administered Date(s) Administered   Hepatitis A, Adult 12/29/2021   Hepb-cpg 12/29/2021, 02/09/2022   Influenza,inj,Quad PF,6+ Mos 06/28/2018, 07/10/2019, 10/05/2020   Influenza-Unspecified 06/28/2018   PNEUMOCOCCAL CONJUGATE-20 02/09/2022   Pneumococcal Conjugate-13 09/02/2013, 10/24/2013   Pneumococcal Polysaccharide-23 08/26/2014   Tdap 05/20/2015, 02/02/2018    Health Maintenance  Topic Date Due   COVID-19 Vaccine (1) Never done   INFLUENZA VACCINE  05/24/2022   TETANUS/TDAP  02/03/2028   COLONOSCOPY (Pts 45-36yr Insurance coverage will need to be confirmed)  05/29/2029   Hepatitis C Screening  Completed   HIV Screening   Completed   HPV VACCINES  Aged Out    Discussed health benefits of physical activity, and encouraged him to engage in regular exercise appropriate for his age and condition.  1. Encounter for annual health examination Pt is not prepared for labs today and prefers to postpone labs to the next appt. Uptodate with vaccinations/age-related Pt planned to discuss the effective doses for Abilify and trazodone with his new psychiatrist. Pt prefers to discuss all the other chronic problems except annual physical  during his fu visit FU in 6 weeks for hip pain and topamax/migraine    The patient was advised to call back or seek an in-person evaluation if the symptoms worsen or if the condition fails to improve as anticipated.  I discussed the assessment and treatment plan with the patient. The patient was provided an opportunity to ask questions and all were answered. The patient agreed with the plan and demonstrated an understanding of the instructions.  The entirety of the information documented in the History of Present Illness, Review of Systems and Physical Exam were personally obtained by me. Portions of this information were initially documented by the CMA and reviewed by me for thoroughness and accuracy.  Portions of this note were created using dictation software and may contain typographical errors.     Mardene Speak, PA-C  [facility] [phone] (phone) [phone] (fax)  [place]

## 2022-05-05 ENCOUNTER — Encounter: Payer: 59 | Admitting: Family Medicine

## 2022-05-05 ENCOUNTER — Encounter: Payer: Self-pay | Admitting: Physician Assistant

## 2022-05-05 ENCOUNTER — Ambulatory Visit (INDEPENDENT_AMBULATORY_CARE_PROVIDER_SITE_OTHER): Payer: 59 | Admitting: Physician Assistant

## 2022-05-05 ENCOUNTER — Other Ambulatory Visit: Payer: Self-pay | Admitting: Psychiatry

## 2022-05-05 VITALS — BP 103/74 | HR 85 | Temp 98.1°F | Resp 16 | Ht 68.0 in | Wt 170.6 lb

## 2022-05-05 DIAGNOSIS — Z0000 Encounter for general adult medical examination without abnormal findings: Secondary | ICD-10-CM | POA: Diagnosis not present

## 2022-05-07 DIAGNOSIS — Z0000 Encounter for general adult medical examination without abnormal findings: Secondary | ICD-10-CM | POA: Insufficient documentation
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
## 2022-05-11 ENCOUNTER — Encounter: Payer: Self-pay | Admitting: Psychiatry

## 2022-05-11 ENCOUNTER — Telehealth (INDEPENDENT_AMBULATORY_CARE_PROVIDER_SITE_OTHER): Payer: 59 | Admitting: Psychiatry

## 2022-05-11 DIAGNOSIS — F3132 Bipolar disorder, current episode depressed, moderate: Secondary | ICD-10-CM

## 2022-05-11 DIAGNOSIS — G4700 Insomnia, unspecified: Secondary | ICD-10-CM

## 2022-05-11 MED ORDER — TRAZODONE HCL 100 MG PO TABS: 100.0000 mg | ORAL_TABLET | Freq: Every evening | ORAL | 0 refills | Status: DC | PRN

## 2022-05-11 MED ORDER — ARIPIPRAZOLE 10 MG PO TABS: 10.0000 mg | ORAL_TABLET | Freq: Every day | ORAL | 0 refills | Status: DC

## 2022-05-11 NOTE — Patient Instructions (Signed)
Start Abilify 10 mg at night  Increase Trazodone 100 mg at night as needed for insomnia Next appointment: 8/15 at 3 PM

## 2022-05-17 ENCOUNTER — Other Ambulatory Visit: Payer: Self-pay

## 2022-05-18 ENCOUNTER — Encounter: Payer: Self-pay | Admitting: Gastroenterology

## 2022-05-18 ENCOUNTER — Ambulatory Visit (INDEPENDENT_AMBULATORY_CARE_PROVIDER_SITE_OTHER): Payer: 59 | Admitting: Gastroenterology

## 2022-05-18 ENCOUNTER — Other Ambulatory Visit: Payer: Self-pay

## 2022-05-18 VITALS — BP 118/76 | HR 80 | Temp 98.7°F | Ht 68.0 in | Wt 171.1 lb

## 2022-05-18 DIAGNOSIS — R634 Abnormal weight loss: Secondary | ICD-10-CM

## 2022-05-18 DIAGNOSIS — G8929 Other chronic pain: Secondary | ICD-10-CM

## 2022-05-18 DIAGNOSIS — R1013 Epigastric pain: Secondary | ICD-10-CM | POA: Diagnosis not present

## 2022-05-18 DIAGNOSIS — R195 Other fecal abnormalities: Secondary | ICD-10-CM

## 2022-05-18 MED ORDER — OMEPRAZOLE 40 MG PO CPDR: 40.0000 mg | DELAYED_RELEASE_CAPSULE | Freq: Every day | ORAL | 2 refills | Status: DC

## 2022-05-18 MED ORDER — NA SULFATE-K SULFATE-MG SULF 17.5-3.13-1.6 GM/177ML PO SOLN: 354.0000 mL | Freq: Once | ORAL | 0 refills | Status: AC

## 2022-05-18 NOTE — Progress Notes (Signed)
Cephas Darby, MD [address]  [place]  [place], [place] [address]  Main: [phone]  Fax: [phone]    Gastroenterology Consultation  Referring Provider:     Virginia Crews, MD Primary Care Physician:  Virginia Crews, MD Primary Gastroenterologist:  Dr. Cephas Darby Reason for Consultation: Unexplained weight loss, epigastric pain        HPI:   Troy [name] is a 51 y.o. male referred by Dr. Brita Romp, Dionne Bucy, MD  for consultation & management of unexplained weight loss.  Patient reports that since April, he has been losing weight gradually associated with epigastric pain, limiting his p.o. intake.  He lost about 30 pounds within last 4 months.  He denies any abdominal distention, bloating.  He does report some loose stools.  Patient has history of sleeve gastrectomy, found to have erosive esophagitis based on upper endoscopy in 05/30/2019.  At that time he was on Prilosec 40 mg p.o. twice daily for 3 months, decreased to 40 mg once a day for 1 month and stopped after confirmation of healing of erosive esophagitis.  Patient denies any new medications.  He is currently being treated for HIV, currently in remission  NSAIDs: None  Antiplts/Anticoagulants/Anti thrombotics: None  GI Procedures:  EGD 09/12/2019 - Normal duodenal bulb and second portion of the duodenum. - Normal stomach. - Salmon-colored mucosa. Biopsied. - Normal esophagus. DIAGNOSIS:  A. GASTROESOPHAGEAL JUNCTION; COLD BIOPSY:  - SQUAMOUS MUCOSA WITH FEATURES OF REFLUX ESOPHAGITIS.  - NO COLUMNAR MUCOSA IDENTIFIED.  - NO INCREASE IN INTRAEPITHELIAL EOSINOPHILS (LESS THAN 2 PER HPF).  - NEGATIVE FOR DYSPLASIA AND MALIGNANCY.    EGD and colonoscopy 05/30/2019 - LA Grade C reflux esophagitis. - Normal stomach. Biopsied. - Normal duodenal bulb and second portion of the duodenum. Biopsied.  - Preparation of the colon was fair. - Patent end-to-side ileo-colonic anastomosis, characterized  by healthy appearing mucosa. - The examined portion of the ileum was normal. Biopsied. - One 6 mm polyp in the descending colon, removed with a cold snare. Resected and retrieved. - Normal mucosa in the entire examined colon. Biopsied. - The distal rectum and anal verge are normal on retroflexion view. DIAGNOSIS:  A.  DUODENUM; COLD BIOPSY:  - UNREMARKABLE DUODENAL MUCOSA.  - NEGATIVE FOR FEATURES OF CELIAC DISEASE.  - NEGATIVE FOR DYSPLASIA AND MALIGNANCY.   B.  STOMACH; COLD BIOPSY:  - GASTRIC ANTRAL MUCOSA WITH MILD REACTIVE CHANGES.  - NEGATIVE FOR ACTIVE INFLAMMATION AND H. PYLORI.  - NEGATIVE FOR INTESTINAL METAPLASIA, DYSPLASIA, AND MALIGNANCY.   C.  TERMINAL ILEUM; COLD BIOPSY:  - UNREMARKABLE ENTERIC MUCOSA.  - NEGATIVE FOR INFLAMMATION, DYSPLASIA, AND MALIGNANCY.   D.  COLON, RANDOM; COLD BIOPSY:  - COLONIC MUCOSA WITH FOCAL MINIMAL ACTIVE INFLAMMATION.  - SEE COMMENT.  - NEGATIVE FOR DYSPLASIA AND MALIGNANCY.   DIAGNOSIS:  A. GASTROESOPHAGEAL JUNCTION; COLD BIOPSY:  - SQUAMOUS MUCOSA WITH FEATURES OF REFLUX ESOPHAGITIS.  - NO COLUMNAR MUCOSA IDENTIFIED.  - NO INCREASE IN INTRAEPITHELIAL EOSINOPHILS (LESS THAN 2 PER HPF).  - NEGATIVE FOR DYSPLASIA AND MALIGNANCY  Past Medical History:  Diagnosis Date   Asthma    Bipolar 1 disorder ([place])    Diabetes mellitus without complication ([place])    History of CVA with residual deficit    HIV infection ([place])    Nephrolithiasis     Past Surgical History:  Procedure Laterality Date   COLONOSCOPY     COLONOSCOPY WITH PROPOFOL N/A 05/30/2019  Procedure: COLONOSCOPY WITH PROPOFOL;  Surgeon: Lin Landsman, MD;  Location: [facility] ENDOSCOPY;  Service: Gastroenterology;  Laterality: N/A;   ELBOW SURGERY Left    fracture   ESOPHAGOGASTRODUODENOSCOPY (EGD) WITH PROPOFOL N/A 05/30/2019   Procedure: ESOPHAGOGASTRODUODENOSCOPY (EGD) WITH PROPOFOL;  Surgeon: Lin Landsman, MD;  Location: [facility];  Service:  Gastroenterology;  Laterality: N/A;   ESOPHAGOGASTRODUODENOSCOPY (EGD) WITH PROPOFOL N/A 09/12/2019   Procedure: ESOPHAGOGASTRODUODENOSCOPY (EGD) WITH PROPOFOL;  Surgeon: Lin Landsman, MD;  Location: [facility] ENDOSCOPY;  Service: Gastroenterology;  Laterality: N/A;   FACIAL COSMETIC SURGERY     GASTRIC BYPASS     HERNIA REPAIR     KIDNEY STONE SURGERY     renal artery repair       Current Outpatient Medications:    albuterol (VENTOLIN HFA) 108 (90 Base) MCG/ACT inhaler, Inhale 1-2 puffs into the lungs every 6 (six) hours as needed for wheezing or shortness of breath., Disp: 18 g, Rfl: 3   ARIPiprazole (ABILIFY) 10 MG tablet, Take 1 tablet (10 mg total) by mouth daily., Disp: 30 tablet, Rfl: 0   cetirizine (ZYRTEC) 10 MG tablet, Take 1 tablet (10 mg total) by mouth daily., Disp: 90 tablet, Rfl: 3   montelukast (SINGULAIR) 10 MG tablet, Take 1 tablet (10 mg total) by mouth daily., Disp: 90 tablet, Rfl: 3   ODEFSEY 200-25-25 MG TABS tablet, Take 1 tablet by mouth daily., Disp: 30 tablet, Rfl: 11   ondansetron (ZOFRAN ODT) 4 MG disintegrating tablet, Take 1 tablet (4 mg total) by mouth every 8 (eight) hours as needed., Disp: 20 tablet, Rfl: 0   SUMAtriptan (IMITREX) 100 MG tablet, TAKE ONE TABLET BY MOUTH AT ONSET OF THE HEADACHE. MAY REPEAT IN TWO HOURS, Disp: 10 tablet, Rfl: 3   tiZANidine (ZANAFLEX) 4 MG tablet, Take by mouth 2 (two) times daily., Disp: , Rfl:    topiramate (TOPAMAX) 100 MG tablet, Take 1 tablet (100 mg total) by mouth 2 (two) times daily., Disp: 180 tablet, Rfl: 3   traZODone (DESYREL) 100 MG tablet, Take 1 tablet (100 mg total) by mouth at bedtime as needed for sleep., Disp: 30 tablet, Rfl: 0   Na Sulfate-K Sulfate-Mg Sulf 17.5-3.13-1.6 GM/177ML SOLN, Take 354 mLs by mouth once for 1 dose., Disp: 354 mL, Rfl: 0   omeprazole (PRILOSEC) 40 MG capsule, Take 1 capsule (40 mg total) by mouth daily before breakfast., Disp: 30 capsule, Rfl: 2   Family History  Problem  Relation Age of Onset   Bipolar disorder Mother    COPD Mother    Diabetes Mother    Hypertension Mother    Hyperlipidemia Mother    Healthy Father    Cancer Father        skin   Stroke Brother    Heart disease Brother    Seizures Brother    Thyroid disease Brother    Diabetes Maternal Grandfather    Hyperlipidemia Maternal Grandfather    Cancer Maternal Grandmother        breast   Hyperlipidemia Maternal Grandmother    Hypertension Maternal Grandmother    Heart disease Paternal Grandfather    Hypertension Paternal Grandfather      Social History   Tobacco Use   Smoking status: Former    Packs/day: 1.00    Years: 25.00    Total pack years: 25.00    Types: E-cigarettes, Cigarettes    Quit date: 2013    Years since quitting: 10.5   Smokeless tobacco: Never  Vaping  Use   Vaping Use: Every day  Substance Use Topics   Alcohol use: Not Currently    Comment: 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, active in [place]   Drug use: Not Currently    Comment: previously used, no IV drugs, 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    Allergies as of 05/18/2022 - Review Complete 05/18/2022  Allergen Reaction Noted   Niacin Anaphylaxis 08/10/2011   Orange oil Anaphylaxis 06/29/2016   Zolpidem  12/16/2015    Review of Systems:    All systems reviewed and negative except where noted in HPI.   Physical Exam:  BP 118/76 (BP Location: Left Arm, Patient Position: Sitting, Cuff Size: Normal)   Pulse 80   Temp 98.7 F (37.1 C) (Oral)   Ht 5' 8"  (1.727 m)   Wt 171 lb 2 oz (77.6 kg)   BMI 26.02 kg/m  No LMP for male patient.  General:   Alert,  Well-developed, well-nourished, pleasant and cooperative in NAD Head:  Normocephalic and atraumatic. Eyes:  Sclera clear, no icterus.   Conjunctiva pink. Ears:  Normal auditory acuity. Nose:  No deformity, discharge, or lesions. Mouth:  No deformity or lesions,oropharynx pink & moist. Neck:  Supple; no masses or thyromegaly. Lungs:  Respirations even and unlabored.  Clear throughout to  auscultation.   No wheezes, crackles, or rhonchi. No acute distress. Heart:  Regular rate and rhythm; no murmurs, clicks, rubs, or gallops. Abdomen:  Normal bowel sounds. Soft, non-tender and non-distended without masses, hepatosplenomegaly or hernias noted.  No guarding or rebound tenderness.   Rectal: Not performed Msk:  Symmetrical without gross deformities. Good, equal movement & strength bilaterally. Pulses:  Normal pulses noted. Extremities:  No clubbing or edema.  No cyanosis. Neurologic:  Alert and oriented x3;  grossly normal neurologically. Skin:  Intact without significant lesions or rashes. No jaundice. Psych:  Alert and cooperative. Normal mood and affect.  Imaging Studies: No abdominal imaging  Assessment and Plan:   Troy [name] is a 51 y.o. male with history of HIV in remission on ART, history of erosive esophagitis, history of gastric sleeve, history of ileocolonic anastomosis, is seen in consultation for 4 months history of unexplained weight loss, epigastric pain and decreased appetite.  Recommend upper endoscopy and colonoscopy for further evaluation Check pancreatic fecal elastase levels and fecal calprotectin levels If above work-up is unremarkable, recommend CT abdomen and pelvis with contrast Restart omeprazole 40 mg once a day before breakfast  Follow up in 3 to 4 months   Cephas Darby, MD

## 2022-05-23 ENCOUNTER — Telehealth: Payer: Self-pay | Admitting: Gastroenterology

## 2022-05-23 NOTE — Telephone Encounter (Signed)
Pt called and cancelled procedure for 08/10 will call back and resched

## 2022-06-02 ENCOUNTER — Encounter: Admission: RE | Payer: Self-pay | Source: Ambulatory Visit

## 2022-06-02 ENCOUNTER — Ambulatory Visit: Admission: RE | Admit: 2022-06-02 | Payer: 59 | Source: Ambulatory Visit | Admitting: Gastroenterology

## 2022-06-02 ENCOUNTER — Other Ambulatory Visit: Payer: Self-pay | Admitting: Psychiatry

## 2022-06-02 SURGERY — COLONOSCOPY WITH PROPOFOL
Anesthesia: General
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
## 2022-06-06 ENCOUNTER — Other Ambulatory Visit: Payer: Self-pay | Admitting: Psychiatry

## 2022-06-07 ENCOUNTER — Encounter: Payer: Self-pay | Admitting: Psychiatry

## 2022-06-07 ENCOUNTER — Ambulatory Visit (INDEPENDENT_AMBULATORY_CARE_PROVIDER_SITE_OTHER): Payer: 59 | Admitting: Psychiatry

## 2022-06-07 VITALS — BP 127/74 | HR 73 | Temp 98.3°F | Wt 173.8 lb

## 2022-06-07 DIAGNOSIS — R69 Illness, unspecified: Secondary | ICD-10-CM | POA: Diagnosis not present

## 2022-06-07 DIAGNOSIS — F3132 Bipolar disorder, current episode depressed, moderate: Secondary | ICD-10-CM | POA: Diagnosis not present

## 2022-06-07 DIAGNOSIS — F4310 Post-traumatic stress disorder, unspecified: Secondary | ICD-10-CM | POA: Diagnosis not present

## 2022-06-07 DIAGNOSIS — G4700 Insomnia, unspecified: Secondary | ICD-10-CM

## 2022-06-07 MED ORDER — ESZOPICLONE 1 MG PO TABS
1.0000 mg | ORAL_TABLET | Freq: Every evening | ORAL | 1 refills | Status: DC | PRN
Start: 2022-06-07 — End: 2022-06-20

## 2022-06-07 MED ORDER — ARIPIPRAZOLE 10 MG PO TABS: 10.0000 mg | ORAL_TABLET | Freq: Every day | ORAL | 1 refills | Status: DC

## 2022-06-07 MED ORDER — SERTRALINE HCL 25 MG PO TABS: 25.0000 mg | ORAL_TABLET | Freq: Every day | ORAL | 1 refills | Status: DC

## 2022-06-07 NOTE — Patient Instructions (Signed)
Start Abilify 10 mg at night  Start sertraline 25 mg at night  Discontinue Trazodone Start Lunesta 1 mg at night as needed for sleep Next appointment: 10/3 at 2 PM

## 2022-06-14 ENCOUNTER — Ambulatory Visit: Payer: 59 | Admitting: Physician Assistant

## 2022-06-14 ENCOUNTER — Ambulatory Visit: Payer: Self-pay

## 2022-06-14 NOTE — Progress Notes (Unsigned)
I,Jana Aysia Lowder,acting as a Education administrator for [employer], PA-C.,have documented all relevant documentation on the behalf of Mardene Speak, PA-C,as directed by  [employer], PA-C while in the presence of [employer], PA-C.   Established patient visit   Patient: Troy [name]   DOB: 1970/10/28   51 y.o. Male  MRN: [id number] Visit Date: 06/15/2022  Today's healthcare provider: Mardene Speak, PA-C  CC: boil - like area  Subjective    Patient presents for boil-like area in right side of buttock since last Thursday.  States it started as pimple and he picked at it and developed into large area. Reports previous purulent drainage - no drainage currently but area is red, painful and hard.  Reports no history of boil.  He is concerned about MRSA/ hx 10 years ago. Denies fever, but had 3 nights of cold chills and sweating on Friday, Saturday and Sunday.    Medications: Outpatient Medications Prior to Visit  Medication Sig   albuterol (VENTOLIN HFA) 108 (90 Base) MCG/ACT inhaler Inhale 1-2 puffs into the lungs every 6 (six) hours as needed for wheezing or shortness of breath.   ARIPiprazole (ABILIFY) 10 MG tablet Take 1 tablet (10 mg total) by mouth daily.   cetirizine (ZYRTEC) 10 MG tablet Take 1 tablet (10 mg total) by mouth daily.   eszopiclone (LUNESTA) 1 MG TABS tablet Take 1 tablet (1 mg total) by mouth at bedtime as needed for sleep. Take immediately before bedtime   montelukast (SINGULAIR) 10 MG tablet Take 1 tablet (10 mg total) by mouth daily.   Na Sulfate-K Sulfate-Mg Sulf 17.5-3.13-1.6 GM/177ML SOLN SMARTSIG:354 Milliliter(s) By Mouth Once   ODEFSEY 200-25-25 MG TABS tablet Take 1 tablet by mouth daily.   omeprazole (PRILOSEC) 40 MG capsule Take 1 capsule (40 mg total) by mouth daily before breakfast.   ondansetron (ZOFRAN ODT) 4 MG disintegrating tablet Take 1 tablet (4 mg total) by mouth every 8 (eight) hours as needed.   sertraline (ZOLOFT) 25 MG tablet Take 1 tablet (25 mg  total) by mouth at bedtime.   SUMAtriptan (IMITREX) 100 MG tablet TAKE ONE TABLET BY MOUTH AT ONSET OF THE HEADACHE. MAY REPEAT IN TWO HOURS   tiZANidine (ZANAFLEX) 4 MG tablet Take by mouth 2 (two) times daily.   topiramate (TOPAMAX) 100 MG tablet Take 1 tablet (100 mg total) by mouth 2 (two) times daily.   No facility-administered medications prior to visit.    Review of Systems  All other systems reviewed and are negative.  Except see HPI    Objective    BP 108/75 (BP Location: Right Arm, Patient Position: Sitting, Cuff Size: Normal)   Pulse 63   Temp 97.8 F (36.6 C) (Oral)   Resp 16   Wt 172 lb (78 kg)   SpO2 99%   BMI 26.15 kg/m    Physical Exam Vitals reviewed.  Constitutional:      General: He is not in acute distress.    Appearance: Normal appearance. He is not diaphoretic.  HENT:     Head: Normocephalic and atraumatic.  Eyes:     General: No scleral icterus.    Conjunctiva/sclera: Conjunctivae normal.  Cardiovascular:     Rate and Rhythm: Normal rate and regular rhythm.     Pulses: Normal pulses.     Heart sounds: Normal heart sounds. No murmur heard. Pulmonary:     Effort: Pulmonary effort is normal. No respiratory distress.     Breath sounds: Normal breath sounds.  No wheezing or rhonchi.  Musculoskeletal:     Cervical back: Neck supple.     Right lower leg: No edema.     Left lower leg: No edema.  Lymphadenopathy:     Cervical: No cervical adenopathy.  Skin:    General: Skin is warm and dry.     Findings: No rash.     Comments: Indurated, tender, erythematous 7cm x5cm area on the R buttock with central indentation/oozing pus?  Neurological:     Mental Status: He is alert and oriented to person, place, and time. Mental status is at baseline.  Psychiatric:        Mood and Affect: Mood normal.        Behavior: Behavior normal.       No results found for any visits on 06/15/22.  Assessment & Plan     1. Boil of buttock Inflamed -  sulfamethoxazole-trimethoprim (BACTRIM DS) 800-160 MG tablet; Take 1 tablet by mouth 2 (two) times daily.  Dispense: 28 tablet; Refill: 0 - Ambulatory referral to General Surgery  Has HIV, seen by ID Q 3 mo.  Had MRSA in the past  FU PRN The patient was advised to call back or seek an in-person evaluation if the symptoms worsen or if the condition fails to improve as anticipated.  I discussed the assessment and treatment plan with the patient. The patient was provided an opportunity to ask questions and all were answered. The patient agreed with the plan and demonstrated an understanding of the instructions.  The entirety of the information documented in the History of Present Illness, Review of Systems and Physical Exam were personally obtained by me. Portions of this information were initially documented by the CMA and reviewed by me for thoroughness and accuracy.  Portions of this note were created using dictation software and may contain typographical errors.     Mardene Speak, PA-C  [facility] [phone] (phone) [phone] (fax)  [place]

## 2022-06-14 NOTE — Telephone Encounter (Signed)
  Chief Complaint: boil Symptoms: pain and purulent drainage previously- no drainage but area is red and hard Frequency: last Thursday- pt stated started as a pimple was picking at it then developed into large area of swelling and pain Pertinent Negatives: Patient denies current fever- had 3 nights of cold chills and sweating Disposition: [] ED /[] Urgent Care (no appt availability in office) / [x] Appointment(In office/virtual)/ []  [facility] [place] [facility]/ [] Home Care/ [] Refused Recommended Disposition /[] [place] [facility]/ []  Follow-up with PCP Additional Notes: pt had to cancel appt for today- offered pt a later appt for today and pt stated he would not be able to make it in- appt scheduled for tomorrow in office Reason for Disposition  Boil > 2 inches across (> 5 cm; larger than a golf ball or ping pong ball)  Answer Assessment - Initial Assessment Questions 1. APPEARANCE of BOIL: "What does the boil look like?"      Red area with "hole" where boil busted- was draining purulent and serosanguinous drainage no longer draining-area feels "hard" 2. LOCATION: "Where is the boil located?"      Right buttock 3. NUMBER: "How many boils are there?"      1 4. SIZE: "How big is the boil?" (e.g., inches, cm; compare to size of a coin or other object)     Pt stated it cover half of buttock 5. ONSET: "When did the boil start?"     Thursday 6. PAIN: "Is there any pain?" If Yes, ask: "How bad is the pain?"   (Scale 1-10; or mild, moderate, severe)     5/10 7. FEVER: "Do you have a fever?" If Yes, ask: "What is it, how was it measured, and when did it start?"      None - did have 3  nights of cold chills and sweating  8. SOURCE: "Have you been around anyone with boils or other Staph infections?" "Have you ever had boils before?"     H/o MRSA- yes 9. OTHER SYMPTOMS: "Do you have any other symptoms?" (e.g., shaking chills, weakness, rash elsewhere on body)     no 10. PREGNANCY: "Is there any chance  you are pregnant?" "When was your last menstrual period?"       N/a  Protocols used: Boil (Skin Abscess)-A-AH

## 2022-06-14 NOTE — Progress Notes (Deleted)
     I,Jana Salathiel Ferrara,acting as a Education administrator for [employer], PA-C.,have documented all relevant documentation on the behalf of Mardene Speak, PA-C,as directed by  [employer], PA-C while in the presence of [employer], PA-C.   Established patient visit   Patient: Troy [name]   DOB: [birth year]   51 y.o. Male  MRN: [id number] Visit Date: 06/14/2022  Today's healthcare provider: Mardene Speak, PA-C   No chief complaint on file.  Subjective    HPI  ***  Medications: Outpatient Medications Prior to Visit  Medication Sig   albuterol (VENTOLIN HFA) 108 (90 Base) MCG/ACT inhaler Inhale 1-2 puffs into the lungs every 6 (six) hours as needed for wheezing or shortness of breath.   ARIPiprazole (ABILIFY) 10 MG tablet Take 1 tablet (10 mg total) by mouth daily.   cetirizine (ZYRTEC) 10 MG tablet Take 1 tablet (10 mg total) by mouth daily.   eszopiclone (LUNESTA) 1 MG TABS tablet Take 1 tablet (1 mg total) by mouth at bedtime as needed for sleep. Take immediately before bedtime   montelukast (SINGULAIR) 10 MG tablet Take 1 tablet (10 mg total) by mouth daily.   Na Sulfate-K Sulfate-Mg Sulf 17.5-3.13-1.6 GM/177ML SOLN SMARTSIG:354 Milliliter(s) By Mouth Once   ODEFSEY 200-25-25 MG TABS tablet Take 1 tablet by mouth daily.   omeprazole (PRILOSEC) 40 MG capsule Take 1 capsule (40 mg total) by mouth daily before breakfast.   ondansetron (ZOFRAN ODT) 4 MG disintegrating tablet Take 1 tablet (4 mg total) by mouth every 8 (eight) hours as needed.   sertraline (ZOLOFT) 25 MG tablet Take 1 tablet (25 mg total) by mouth at bedtime.   SUMAtriptan (IMITREX) 100 MG tablet TAKE ONE TABLET BY MOUTH AT ONSET OF THE HEADACHE. MAY REPEAT IN TWO HOURS   tiZANidine (ZANAFLEX) 4 MG tablet Take by mouth 2 (two) times daily.   topiramate (TOPAMAX) 100 MG tablet Take 1 tablet (100 mg total) by mouth 2 (two) times daily.   No facility-administered medications prior to visit.    Review of Systems  {Labs  Heme   Chem  Endocrine  Serology  Results Review (optional):23779}   Objective    There were no vitals taken for this visit. {Show previous vital signs (optional):23777}  Physical Exam  ***  No results found for any visits on 06/14/22.  Assessment & Plan     ***  No follow-ups on file.      {provider attestation***:1}   Mardene Speak, Hershal Coria  [facility] [phone] (phone) [phone] (fax)  [place]

## 2022-06-15 ENCOUNTER — Telehealth: Payer: Self-pay

## 2022-06-15 ENCOUNTER — Ambulatory Visit (INDEPENDENT_AMBULATORY_CARE_PROVIDER_SITE_OTHER): Payer: 59 | Admitting: Physician Assistant

## 2022-06-15 ENCOUNTER — Encounter: Payer: Self-pay | Admitting: Physician Assistant

## 2022-06-15 ENCOUNTER — Telehealth: Payer: Self-pay | Admitting: Psychiatry

## 2022-06-15 VITALS — BP 108/75 | HR 63 | Temp 97.8°F | Resp 16 | Wt 172.0 lb

## 2022-06-15 DIAGNOSIS — L0232 Furuncle of buttock: Secondary | ICD-10-CM | POA: Diagnosis not present

## 2022-06-15 MED ORDER — SULFAMETHOXAZOLE-TRIMETHOPRIM 800-160 MG PO TABS: 1.0000 | ORAL_TABLET | Freq: Two times a day (BID) | ORAL | 0 refills | Status: DC

## 2022-06-15 NOTE — Telephone Encounter (Signed)
Pt scheduled at [facility] surgical tomorrow at 10:30.

## 2022-06-15 NOTE — Telephone Encounter (Signed)
Patient called stating he was told to call on Monday if the medication was not working. Stated he called and left a message "somewhere". I see no note in the system. But the medication he started was [place] and is not working. Sleeping maybe 2 hours. Please advise.

## 2022-06-15 NOTE — Telephone Encounter (Signed)
Could you advise him to take Lunesta 2 tabs (total of 2 mg) to see if it helps? Side effects including drowsiness. Thanks.

## 2022-06-16 ENCOUNTER — Ambulatory Visit (INDEPENDENT_AMBULATORY_CARE_PROVIDER_SITE_OTHER): Payer: 59 | Admitting: Surgery

## 2022-06-16 ENCOUNTER — Telehealth: Payer: Self-pay

## 2022-06-16 ENCOUNTER — Encounter: Payer: Self-pay | Admitting: Surgery

## 2022-06-16 VITALS — BP 125/87 | HR 55 | Temp 98.0°F | Ht 68.0 in | Wt 170.8 lb

## 2022-06-16 DIAGNOSIS — L0231 Cutaneous abscess of buttock: Secondary | ICD-10-CM

## 2022-06-16 DIAGNOSIS — S31829A Unspecified open wound of left buttock, initial encounter: Secondary | ICD-10-CM

## 2022-06-16 NOTE — Patient Instructions (Addendum)
You may Hydrogel and apply on wound with a Band-Aid. You can purchase OTC.    If you have any concerns or questions, please feel free to call our office. See follow up appointment below.   Skin Abscess  A skin abscess is an infected area of your skin that contains pus and other material. An abscess can happen in any part of your body. Some abscesses break open (rupture) on their own. Most continue to get worse unless they are treated. The infection can spread deeper into the body and into your blood, which can make you feel sick. A skin abscess is caused by germs that enter the skin through a cut or scrape. It can also be caused by blocked oil and sweat glands or infected hair follicles. This condition is usually treated by: Draining the pus. Taking antibiotic medicines. Placing a warm, wet washcloth over the abscess. Follow these instructions at home: Medicines  Take over-the-counter and prescription medicines only as told by your doctor. If you were prescribed an antibiotic medicine, take it as told by your doctor. Do not stop taking the antibiotic even if you start to feel better. Abscess care  If you have an abscess that has not drained, place a warm, clean, wet washcloth over the abscess several times a day. Do this as told by your doctor. Follow instructions from your doctor about how to take care of your abscess. Make sure you: Cover the abscess with a bandage (dressing). Change your bandage or gauze as told by your doctor. Wash your hands with soap and water before you change the bandage or gauze. If you cannot use soap and water, use hand sanitizer. Check your abscess every day for signs that the infection is getting worse. Check for: More redness, swelling, or pain. More fluid or blood. Warmth. More pus or a bad smell. General instructions To avoid spreading the infection: Do not share personal care items, towels, or hot tubs with others. Avoid making skin-to-skin contact  with other people. Keep all follow-up visits as told by your doctor. This is important. Contact a doctor if: You have more redness, swelling, or pain around your abscess. You have more fluid or blood coming from your abscess. Your abscess feels warm when you touch it. You have more pus or a bad smell coming from your abscess. Your muscles ache. You feel sick. Get help right away if: You have very bad (severe) pain. You see red streaks on your skin spreading away from the abscess. You see redness that spreads quickly. You have a fever or chills. Summary A skin abscess is an infected area of your skin that contains pus and other material. The abscess is caused by germs that enter the skin through a cut or scrape. It can also be caused by blocked oil and sweat glands or infected hair follicles. Follow your doctor's instructions on caring for your abscess, taking medicines, preventing infections, and keeping follow-up visits. This information is not intended to replace advice given to you by your health care provider. Make sure you discuss any questions you have with your health care provider. Document Revised: 01/13/2022 Document Reviewed: 07/19/2021 Elsevier Patient Education  [place].

## 2022-06-16 NOTE — Progress Notes (Signed)
Patient ID: Troy [name], male   DOB: May 23, 1971, 51 y.o.   MRN: [id number]  Chief Complaint: Left buttock abscess  History of Present Illness Goro Wenrick is a 51 y.o. male with a boil of his left buttock seen June 08, 2022.  He reports he has had drainage with both blood and purulence.  He reports a history of fever and chills.  Recently started on antibiotics of Bactrim.  Denies nausea or vomiting.  Reports pain still at an 8 to a 9 out of 10.  Past Medical History Past Medical History:  Diagnosis Date   Asthma    Bipolar 1 disorder ([place])    Diabetes mellitus without complication ([place])    History of CVA with residual deficit    HIV infection ([place])    Nephrolithiasis       Past Surgical History:  Procedure Laterality Date   COLONOSCOPY     COLONOSCOPY WITH PROPOFOL N/A 05/30/2019   Procedure: COLONOSCOPY WITH PROPOFOL;  Surgeon: Lin Landsman, MD;  Location: [facility] ENDOSCOPY;  Service: Gastroenterology;  Laterality: N/A;   ELBOW SURGERY Left    fracture   ESOPHAGOGASTRODUODENOSCOPY (EGD) WITH PROPOFOL N/A 05/30/2019   Procedure: ESOPHAGOGASTRODUODENOSCOPY (EGD) WITH PROPOFOL;  Surgeon: Lin Landsman, MD;  Location: [place];  Service: Gastroenterology;  Laterality: N/A;   ESOPHAGOGASTRODUODENOSCOPY (EGD) WITH PROPOFOL N/A 09/12/2019   Procedure: ESOPHAGOGASTRODUODENOSCOPY (EGD) WITH PROPOFOL;  Surgeon: Lin Landsman, MD;  Location: [facility] ENDOSCOPY;  Service: Gastroenterology;  Laterality: N/A;   FACIAL COSMETIC SURGERY     GASTRIC BYPASS     HERNIA REPAIR     KIDNEY STONE SURGERY     renal artery repair      Allergies  Allergen Reactions   Niacin Anaphylaxis   Orange Oil Anaphylaxis   Zolpidem     Other reaction(s): Unknown Sleep walking and sleep eating Other reaction(s): Unknown Sleep walking and sleep eating    Current Outpatient Medications  Medication Sig Dispense Refill   albuterol (VENTOLIN HFA) 108 (90 Base) MCG/ACT inhaler Inhale 1-2 puffs  into the lungs every 6 (six) hours as needed for wheezing or shortness of breath. 18 g 3   ARIPiprazole (ABILIFY) 10 MG tablet Take 1 tablet (10 mg total) by mouth daily. 30 tablet 1   cetirizine (ZYRTEC) 10 MG tablet Take 1 tablet (10 mg total) by mouth daily. 90 tablet 3   eszopiclone (LUNESTA) 1 MG TABS tablet Take 1 tablet (1 mg total) by mouth at bedtime as needed for sleep. Take immediately before bedtime 30 tablet 1   montelukast (SINGULAIR) 10 MG tablet Take 1 tablet (10 mg total) by mouth daily. 90 tablet 3   Na Sulfate-K Sulfate-Mg Sulf 17.5-3.13-1.6 GM/177ML SOLN SMARTSIG:354 Milliliter(s) By Mouth Once     ODEFSEY 200-25-25 MG TABS tablet Take 1 tablet by mouth daily. 30 tablet 11   omeprazole (PRILOSEC) 40 MG capsule Take 1 capsule (40 mg total) by mouth daily before breakfast. 30 capsule 2   ondansetron (ZOFRAN ODT) 4 MG disintegrating tablet Take 1 tablet (4 mg total) by mouth every 8 (eight) hours as needed. 20 tablet 0   sertraline (ZOLOFT) 25 MG tablet Take 1 tablet (25 mg total) by mouth at bedtime. 30 tablet 1   sulfamethoxazole-trimethoprim (BACTRIM DS) 800-160 MG tablet Take 1 tablet by mouth 2 (two) times daily. 28 tablet 0   SUMAtriptan (IMITREX) 100 MG tablet TAKE ONE TABLET BY MOUTH AT ONSET OF THE HEADACHE. MAY REPEAT IN TWO HOURS 10 tablet 3  tiZANidine (ZANAFLEX) 4 MG tablet Take by mouth 2 (two) times daily.     topiramate (TOPAMAX) 100 MG tablet Take 1 tablet (100 mg total) by mouth 2 (two) times daily. 180 tablet 3   No current facility-administered medications for this visit.    Family History Family History  Problem Relation Age of Onset   Bipolar disorder Mother    COPD Mother    Diabetes Mother    Hypertension Mother    Hyperlipidemia Mother    Healthy Father    Cancer Father        skin   Stroke Brother    Heart disease Brother    Seizures Brother    Thyroid disease Brother    Diabetes Maternal Grandfather    Hyperlipidemia Maternal Grandfather     Cancer Maternal Grandmother        breast   Hyperlipidemia Maternal Grandmother    Hypertension Maternal Grandmother    Heart disease Paternal Grandfather    Hypertension Paternal Grandfather       Social History Social History   Tobacco Use   Smoking status: Former    Packs/day: 1.00    Years: 25.00    Total pack years: 25.00    Types: E-cigarettes, Cigarettes    Quit date: 2013    Years since quitting: 10.6   Smokeless tobacco: Never  Vaping Use   Vaping Use: Every day  Substance Use Topics   Alcohol use: Not Currently    Comment: 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   HENT: Negative.    Eyes: Negative.   Respiratory: Negative.    Cardiovascular: Negative.   Gastrointestinal: Negative.   Genitourinary: Negative.   Skin: Negative.   Neurological: Negative.   Psychiatric/Behavioral:  Positive for depression.       Physical Exam Blood pressure 125/87, pulse (!) 55, temperature 98 F (36.7 C), temperature source Oral, height 5' 8"  (1.727 m), weight 170 lb 12.8 oz (77.5 kg), SpO2 100 %. Last Weight  Most recent update: 06/16/2022 10:29 AM    Weight  77.5 kg (170 lb 12.8 oz)             CONSTITUTIONAL: Well developed, and nourished, appropriately responsive and aware without distress.  Alert, energetic, does not appear to be in any remarkable pain or toxicity. EYES: Sclera non-icteric.   EARS, NOSE, MOUTH AND THROAT:  The oropharynx is clear. Oral mucosa is pink and moist.   Hearing is intact to voice.  NECK: Trachea is midline, and there is no jugular venous distension.  LYMPH NODES:  Lymph nodes in the neck are not enlarged. RESPIRATORY:  Lungs are clear, and breath sounds are equal bilaterally. Normal respiratory effort without pathologic use of accessory muscles. CARDIOVASCULAR: Heart is regular in rate and rhythm. GI: The  abdomen is soft, nontender, and nondistended. There were no palpable masses. I did not appreciate hepatosplenomegaly. There were normal bowel sounds. GU: Left buttock there is a central, 1 cm ulcer with a yellow fibrinous exudative base, an area of approximately 3 cm radius around this area of pink discoloration, it is not erythemic, does not blanch.  This is consistent with an ulcerated abscess, adequately fully drained/by unroofing.  It appears the antibiotics have made a significant improvement in the interim. MUSCULOSKELETAL:  Symmetrical muscle tone appreciated in all  four extremities.    SKIN: Skin turgor is normal. No pathologic skin lesions appreciated.  NEUROLOGIC:  Motor and sensation appear grossly normal.  Cranial nerves are grossly without defect. PSYCH:  Alert and oriented to person, place and time. Affect is appropriate for situation.  Data Reviewed I have personally reviewed what is currently available of the patient's imaging, recent labs and medical records.   Labs:     Latest Ref Rng & Units 12/10/2021   10:27 AM 01/22/2020   11:11 AM 08/14/2019   11:44 AM  CBC  WBC 3.8 - 10.8 Thousand/uL 5.9  7.4  5.8   Hemoglobin 13.2 - 17.1 g/dL 16.0  14.9  15.3   Hematocrit 38.5 - 50.0 % 47.1  43.6  44.9   Platelets 140 - 400 Thousand/uL 222  186  197       Latest Ref Rng & Units 12/10/2021   10:27 AM 01/22/2020   11:11 AM 12/10/2019    8:46 AM  CMP  Glucose 65 - 99 mg/dL 94  93  78   BUN 7 - 25 mg/dL 10  9  10    Creatinine 0.60 - 1.29 mg/dL 1.22  1.20  1.26   Sodium 135 - 146 mmol/L 139  142  143   Potassium 3.5 - 5.3 mmol/L 4.6  3.9  4.3   Chloride 98 - 110 mmol/L 102  110  109   CO2 20 - 32 mmol/L 28  25  23    Calcium 8.6 - 10.3 mg/dL 10.0  9.4  9.7   Total Protein 6.1 - 8.1 g/dL 7.2  7.2  6.2   Total Bilirubin 0.2 - 1.2 mg/dL 0.6  0.8  0.5   Alkaline Phos 38 - 126 U/L  44  52   AST 10 - 40 U/L 23  22  22    ALT 9 - 46 U/L 19  15  15        Imaging:  Within last 24  hrs: No results found.  Assessment    Residual ulcer from unroofing of an abscess cavity of the left buttock.  I believe the residual induration will continue to improve with his antibiotics and wound care. Patient Active Problem List   Diagnosis Date Noted   Encounter for annual health examination 80/22/3361   Folliculitis barbae 22/44/9753   Pain with ejaculation 10/05/2020   Chronic bilateral low back pain without sciatica 10/05/2020   Lower urinary tract symptoms (LUTS) 11/26/2019   Nephrolithiasis 06/14/2019   Allergic rhinitis 02/13/2019   Asthma 02/11/2019   Crohn's disease without complication ([place]) 00/51/1021   Dyslipidemia 02/11/2019   HIV infection ([place]) 02/11/2019   Kidney stones, calcium oxalate 02/11/2019   History of CVA with residual deficit    Bipolar 1 disorder ([place])    Overweight 01/13/2015   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  He may shower with dressing changes but should maintain hydrogel dressing as long as there is an ulcer.  Anticipating continued resolution of the underlying inflammatory induration.  And I believe he understands the need for good wound care and will follow through.  Instructions given.  We will be glad to see him again to ensure progress in healing is taking place.  There is clearly no need for any incision and drainage at this  time.  Face-to-face time spent with the patient and accompanying care providers(if present) was 30 minutes, with more than 50% of the time spent counseling, educating, and coordinating care of the patient.    These notes generated with voice recognition software. I apologize for typographical errors.  Ronny Bacon M.D., FACS 06/16/2022, 3:30 PM

## 2022-06-16 NOTE — Telephone Encounter (Signed)
pt called left message that he needed a refill on the lunesta 58m

## 2022-06-16 NOTE — Telephone Encounter (Signed)
pt called left message that he needed refills on the lunesta 22m

## 2022-06-16 NOTE — Telephone Encounter (Signed)
I believe he still has Lunesta 1 mg tab. Please advise him to double the dose first to take 2 mg total to see if it works (to avoid unnecessary refill of the medication in case if it does not work). Let me know if he still needs of 2 mg tab.

## 2022-06-20 ENCOUNTER — Telehealth: Payer: Self-pay

## 2022-06-20 ENCOUNTER — Other Ambulatory Visit: Payer: Self-pay | Admitting: Psychiatry

## 2022-06-20 DIAGNOSIS — G4700 Insomnia, unspecified: Secondary | ICD-10-CM

## 2022-06-20 DIAGNOSIS — F3132 Bipolar disorder, current episode depressed, moderate: Secondary | ICD-10-CM

## 2022-06-20 DIAGNOSIS — F4310 Post-traumatic stress disorder, unspecified: Secondary | ICD-10-CM

## 2022-06-20 MED ORDER — ESZOPICLONE 2 MG PO TABS: 2.0000 mg | ORAL_TABLET | Freq: Every evening | ORAL | 0 refills | Status: DC | PRN

## 2022-06-20 NOTE — Telephone Encounter (Signed)
Noted, thanks!

## 2022-06-20 NOTE — Telephone Encounter (Signed)
I have sent a limited supply of Lunesta /eszopiclone 2 mg to [facility] as requested. I have reviewed [place] PMP aware as well as Dr. Ivor Reining notes.  Will route this message to Dr. Modesta Messing to address once she is back.  I will have Janett Billow CMA contact patient to let him know.

## 2022-06-20 NOTE — Telephone Encounter (Signed)
Pt called back states he does needed the 10m sent in.  Dr. EShea Evanssent on 06-20-22.  Pt was called and let know that medication had been called into pharmacy.

## 2022-06-20 NOTE — Telephone Encounter (Signed)
Pt called back states he does needed the 33m sent in.  Dr. EShea Evanssent on 06-20-22.  Pt was called and let know that medication had been called into pharmacy.

## 2022-06-20 NOTE — Telephone Encounter (Signed)
received fax that pt need eszopiclone 37m and not the 131m.  he is out of medication.

## 2022-06-22 ENCOUNTER — Telehealth: Payer: Self-pay

## 2022-06-23 ENCOUNTER — Other Ambulatory Visit: Payer: Self-pay | Admitting: Psychiatry

## 2022-06-23 MED ORDER — ESZOPICLONE 2 MG PO TABS: 2.0000 mg | ORAL_TABLET | Freq: Every evening | ORAL | 1 refills | Status: DC | PRN

## 2022-06-23 NOTE — Telephone Encounter (Signed)
Patient called on 06-23-22 stating the pharmacy has the prescription, Lunesta, but will not give to him without prior authorization from Dr. Shea Evans so not to be charged full price. He has tried to get this resolved for over a week. Please advise.

## 2022-06-23 NOTE — Telephone Encounter (Signed)
I have ordered Lunesta for 30 days prescription with comment that the dose has been uptirated. Could you ask the pharmacy or the patient to see if it works? Thanks.

## 2022-06-24 NOTE — Telephone Encounter (Signed)
left message for the pharmacy to call our office back in regards to the lunesta.

## 2022-06-29 ENCOUNTER — Encounter: Payer: Self-pay | Admitting: Physician Assistant

## 2022-06-29 ENCOUNTER — Other Ambulatory Visit: Payer: Self-pay | Admitting: Psychiatry

## 2022-06-29 ENCOUNTER — Ambulatory Visit (INDEPENDENT_AMBULATORY_CARE_PROVIDER_SITE_OTHER): Payer: 59 | Admitting: Physician Assistant

## 2022-06-29 VITALS — BP 111/77 | HR 75 | Temp 97.9°F | Resp 16 | Wt 170.4 lb

## 2022-06-29 DIAGNOSIS — Z23 Encounter for immunization: Secondary | ICD-10-CM

## 2022-06-29 DIAGNOSIS — L0291 Cutaneous abscess, unspecified: Secondary | ICD-10-CM | POA: Diagnosis not present

## 2022-06-29 MED ORDER — SULFAMETHOXAZOLE-TRIMETHOPRIM 800-160 MG PO TABS: 1.0000 | ORAL_TABLET | Freq: Two times a day (BID) | ORAL | 0 refills | Status: DC

## 2022-06-29 NOTE — Progress Notes (Signed)
I,Corinthian Mizrahi Robinson,acting as a Education administrator for [employer], PA-C.,have documented all relevant documentation on the behalf of Mardene Speak, PA-C,as directed by  [employer], PA-C while in the presence of [employer], PA-C.   Established patient visit   Patient: Troy [name]   DOB: 1971/09/19   51 y.o. Male  MRN: [id number] Visit Date: 06/29/2022  Today's healthcare provider: Mardene Speak, PA-C   Chief Complaint  Patient presents with   Follow-up    boil   Subjective    Follow up for boil   The patient was last seen for this 2 weeks ago. Changes made at last visit include: Bactrim, referral to general surgery. No procedure necessary per surgeon.    He reports excellent compliance with treatment. He feels that condition is Improved. He is not having side effects.  Was seen by Surgery on 06/16/22. Requests a flu vaccine -----------------------------------------------------------------------------------------   Medications: Outpatient Medications Prior to Visit  Medication Sig   albuterol (VENTOLIN HFA) 108 (90 Base) MCG/ACT inhaler Inhale 1-2 puffs into the lungs every 6 (six) hours as needed for wheezing or shortness of breath.   ARIPiprazole (ABILIFY) 10 MG tablet Take 1 tablet (10 mg total) by mouth daily.   cetirizine (ZYRTEC) 10 MG tablet Take 1 tablet (10 mg total) by mouth daily.   eszopiclone (LUNESTA) 2 MG TABS tablet Take 1 tablet (2 mg total) by mouth at bedtime as needed for sleep. Take immediately before bedtime   eszopiclone (LUNESTA) 2 MG TABS tablet Take 1 tablet (2 mg total) by mouth at bedtime as needed for sleep. Take immediately before bedtime   montelukast (SINGULAIR) 10 MG tablet Take 1 tablet (10 mg total) by mouth daily.   Na Sulfate-K Sulfate-Mg Sulf 17.5-3.13-1.6 GM/177ML SOLN SMARTSIG:354 Milliliter(s) By Mouth Once   ODEFSEY 200-25-25 MG TABS tablet Take 1 tablet by mouth daily.   omeprazole (PRILOSEC) 40 MG capsule Take 1 capsule (40 mg total)  by mouth daily before breakfast.   ondansetron (ZOFRAN ODT) 4 MG disintegrating tablet Take 1 tablet (4 mg total) by mouth every 8 (eight) hours as needed.   sertraline (ZOLOFT) 25 MG tablet Take 1 tablet (25 mg total) by mouth at bedtime.   sulfamethoxazole-trimethoprim (BACTRIM DS) 800-160 MG tablet Take 1 tablet by mouth 2 (two) times daily.   SUMAtriptan (IMITREX) 100 MG tablet TAKE ONE TABLET BY MOUTH AT ONSET OF THE HEADACHE. MAY REPEAT IN TWO HOURS   tiZANidine (ZANAFLEX) 4 MG tablet Take by mouth 2 (two) times daily.   topiramate (TOPAMAX) 100 MG tablet Take 1 tablet (100 mg total) by mouth 2 (two) times daily.   No facility-administered medications prior to visit.    Review of Systems  All other systems reviewed and are negative.  Except see HPI    Objective    BP 111/77 (BP Location: Left Arm, Patient Position: Sitting, Cuff Size: Normal)   Pulse 75   Temp 97.9 F (36.6 C) (Oral)   Resp 16   Wt 170 lb 6.4 oz (77.3 kg)   SpO2 100%   BMI 25.91 kg/m    Physical Exam Constitutional:      General: He is not in acute distress.    Appearance: Normal appearance. He is not diaphoretic.  HENT:     Head: Normocephalic.  Eyes:     Conjunctiva/sclera: Conjunctivae normal.  Pulmonary:     Effort: Pulmonary effort is normal. No respiratory distress.  Skin:    Coloration: Skin is not jaundiced  or pale.     Findings: No bruising or erythema.     Comments: Small ulcer /less than 1 cm covered with crust . Residual indurated area of 3 cm radius. No erythema or swelling or tenderness noted.  Neurological:     General: No focal deficit present.     Mental Status: He is alert and oriented to person, place, and time. Mental status is at baseline.     Cranial Nerves: No cranial nerve deficit.     Sensory: No sensory deficit.     Motor: No weakness.     Coordination: Coordination normal.     Gait: Gait normal.     Deep Tendon Reflexes: Reflexes normal.  Psychiatric:         Behavior: Behavior normal.        Thought Content: Thought content normal.        Judgment: Judgment normal.     No results found for any visits on 06/29/22.  Assessment & Plan     1. Abscess/L gluteal area Per chart review form 06/16/22 Residual ulcer from unroofing of an abscess cavity of the left buttock.  Per surgeon, residual induration will continue to improve with his antibiotics and wound care. Continue with current treatment regimen Induration decreased but considering immunocompromised status of pt/hiv and dm: If symptoms and induration will not be resolved or worsen, pt could dispense: - sulfamethoxazole-trimethoprim (BACTRIM DS) 800-160 MG tablet; Take 1 tablet by mouth 2 (two) times daily.  Dispense: 14 tablet; Refill: 0 Patient was instructed not to fill it unless their symptoms worsen  Discussed the potential negative side effects of antibiotics and that they can lead to resistance if used unnecessarily Discussed expectations for duration of symptoms.     2. Needs a flu vaccine - Advised to call and schedule a flu shot / flu clinic started to operate at [facility].  FU PRN The patient was advised to call back or seek an in-person evaluation if the symptoms worsen or if the condition fails to improve as anticipated.  I discussed the assessment and treatment plan with the patient. The patient was provided an opportunity to ask questions and all were answered. The patient agreed with the plan and demonstrated an understanding of the instructions.  The entirety of the information documented in the History of Present Illness, Review of Systems and Physical Exam were personally obtained by me. Portions of this information were initially documented by the CMA and reviewed by me for thoroughness and accuracy.  Portions of this note were created using dictation software and may contain typographical errors.     Mardene Speak, PA-C  [facility] [phone]  (phone) [phone] (fax)  [place]

## 2022-07-05 ENCOUNTER — Ambulatory Visit: Payer: 59 | Admitting: Surgery

## 2022-07-13 ENCOUNTER — Telehealth: Payer: Self-pay | Admitting: Psychiatry

## 2022-07-13 NOTE — Telephone Encounter (Signed)
Patient calling stating he left message last week regarding being out of Lunesta. He is doing well on it and needs refill. Note in system states waiting on pharmacy to call back? Patient ran out last week and has been waiting for refill. Please advise

## 2022-07-14 ENCOUNTER — Ambulatory Visit: Payer: 59

## 2022-07-14 ENCOUNTER — Other Ambulatory Visit: Payer: 59

## 2022-07-14 ENCOUNTER — Other Ambulatory Visit: Payer: Self-pay

## 2022-07-14 ENCOUNTER — Encounter: Payer: Self-pay | Admitting: Internal Medicine

## 2022-07-14 ENCOUNTER — Other Ambulatory Visit (HOSPITAL_COMMUNITY)
Admission: RE | Admit: 2022-07-14 | Discharge: 2022-07-14 | Disposition: A | Discharge status: Home / Self Care | Payer: 59 | Source: Ambulatory Visit | Attending: Internal Medicine | Admitting: Internal Medicine

## 2022-07-14 DIAGNOSIS — Z113 Encounter for screening for infections with a predominantly sexual mode of transmission: Secondary | ICD-10-CM | POA: Insufficient documentation

## 2022-07-14 DIAGNOSIS — R69 Illness, unspecified: Secondary | ICD-10-CM | POA: Diagnosis not present

## 2022-07-14 DIAGNOSIS — B20 Human immunodeficiency virus [HIV] disease: Secondary | ICD-10-CM

## 2022-07-14 NOTE — Telephone Encounter (Signed)
Patient has called requesting update. States pharmacy will not release his medication. Says they need authorization to do so? Only will give him 15 per month.

## 2022-07-14 NOTE — Telephone Encounter (Signed)
Dr.Hisada sent Lunesta on 06/23/2022- per [place] PMP AWARE , patient has one refill left .

## 2022-07-15 LAB — T-HELPER CELL (CD4) - (RCID CLINIC ONLY)
CD4 % Helper T Cell: 32 % — ABNORMAL LOW (ref 33–65)
CD4 T Cell Abs: 431 /uL (ref 400–1790)

## 2022-07-15 LAB — URINE CYTOLOGY ANCILLARY ONLY
Chlamydia: NEGATIVE
Comment: NEGATIVE
Comment: NORMAL
Neisseria Gonorrhea: NEGATIVE

## 2022-07-18 ENCOUNTER — Telehealth: Payer: Self-pay

## 2022-07-18 NOTE — Telephone Encounter (Signed)
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  Recommend treatment with Bicillin 2.4 million units x 1 dose.  Would also advise we do oral and rectal GC/CT swabs for completeness (urine was negative).  Thanks, Mitzi Hansen

## 2022-07-18 NOTE — Telephone Encounter (Signed)
Called patient to review lab results. Was able to take call. Is scheduled for Bicillin injection Friday. Understands he must refrain from all sexual encounters at least 10 days after treatment. Understands he must inform recent partners to get tested/ treated at local health department.  Encouraged patient use condoms to prevent STD infections. Did not have any questions at this time.  Leatrice Jewels, RMA

## 2022-07-18 NOTE — Telephone Encounter (Signed)
Attempted to call patient to relay results and get patient scheduled for treatment. No answer. Left HIPAA-compliant message requesting call back.  Binnie Kand, RN

## 2022-07-20 ENCOUNTER — Ambulatory Visit: Payer: Self-pay | Admitting: *Deleted

## 2022-07-20 ENCOUNTER — Encounter: Payer: Self-pay | Admitting: Family Medicine

## 2022-07-20 ENCOUNTER — Ambulatory Visit (INDEPENDENT_AMBULATORY_CARE_PROVIDER_SITE_OTHER): Payer: 59 | Admitting: Family Medicine

## 2022-07-20 VITALS — BP 119/74 | HR 67 | Temp 98.5°F | Resp 16 | Wt 165.0 lb

## 2022-07-20 DIAGNOSIS — J4520 Mild intermittent asthma, uncomplicated: Secondary | ICD-10-CM

## 2022-07-20 LAB — CBC WITH DIFFERENTIAL/PLATELET
Absolute Monocytes: 885 cells/uL (ref 200–950)
Basophils Absolute: 21 cells/uL (ref 0–200)
Basophils Relative: 0.4 %
Eosinophils Absolute: 32 cells/uL (ref 15–500)
Eosinophils Relative: 0.6 %
HCT: 39.9 % (ref 38.5–50.0)
Hemoglobin: 13.9 g/dL (ref 13.2–17.1)
Lymphs Abs: 1526 cells/uL (ref 850–3900)
MCH: 31.7 pg (ref 27.0–33.0)
MCHC: 34.8 g/dL (ref 32.0–36.0)
MCV: 91.1 fL (ref 80.0–100.0)
MPV: 10.6 fL (ref 7.5–12.5)
Monocytes Relative: 16.7 %
Neutro Abs: 2836 cells/uL (ref 1500–7800)
Neutrophils Relative %: 53.5 %
Platelets: 234 10*3/uL (ref 140–400)
RBC: 4.38 10*6/uL (ref 4.20–5.80)
RDW: 12.6 % (ref 11.0–15.0)
Total Lymphocyte: 28.8 %
WBC: 5.3 10*3/uL (ref 3.8–10.8)

## 2022-07-20 LAB — COMPLETE METABOLIC PANEL WITH GFR
AG Ratio: 1.6 (calc) (ref 1.0–2.5)
ALT: 25 U/L (ref 9–46)
AST: 24 U/L (ref 10–40)
Albumin: 4.1 g/dL (ref 3.6–5.1)
Alkaline phosphatase (APISO): 88 U/L (ref 36–130)
BUN: 10 mg/dL (ref 7–25)
CO2: 23 mmol/L (ref 20–32)
Calcium: 9.2 mg/dL (ref 8.6–10.3)
Chloride: 107 mmol/L (ref 98–110)
Creat: 1.05 mg/dL (ref 0.60–1.29)
Globulin: 2.6 g/dL (calc) (ref 1.9–3.7)
Glucose, Bld: 78 mg/dL (ref 65–99)
Potassium: 3.9 mmol/L (ref 3.5–5.3)
Sodium: 140 mmol/L (ref 135–146)
Total Bilirubin: 0.5 mg/dL (ref 0.2–1.2)
Total Protein: 6.7 g/dL (ref 6.1–8.1)
eGFR: 88 mL/min/{1.73_m2} (ref >=60)

## 2022-07-20 LAB — HIV-1 RNA QUANT-NO REFLEX-BLD
HIV 1 RNA Quant: NOT DETECTED Copies/mL
HIV-1 RNA Quant, Log: NOT DETECTED Log cps/mL

## 2022-07-20 LAB — FLUORESCENT TREPONEMAL AB(FTA)-IGG-BLD: Fluorescent Treponemal ABS: REACTIVE — AB

## 2022-07-20 LAB — RPR TITER: RPR Titer: 1:4 {titer} — ABNORMAL HIGH

## 2022-07-20 LAB — RPR: RPR Ser Ql: REACTIVE — AB

## 2022-07-20 MED ORDER — BECLOMETHASONE DIPROP HFA 80 MCG/ACT IN AERB: 1.0000 | INHALATION_SPRAY | Freq: Two times a day (BID) | RESPIRATORY_TRACT | 2 refills | Status: DC

## 2022-07-20 MED ORDER — PREDNISONE 20 MG PO TABS: 40.0000 mg | ORAL_TABLET | Freq: Every day | ORAL | 0 refills | Status: AC

## 2022-07-20 MED ORDER — ALBUTEROL SULFATE HFA 108 (90 BASE) MCG/ACT IN AERS: 2.0000 | INHALATION_SPRAY | Freq: Four times a day (QID) | RESPIRATORY_TRACT | 2 refills | Status: DC | PRN

## 2022-07-20 NOTE — Telephone Encounter (Signed)
  Chief Complaint: Asthma Attack Symptoms: "Asthma attack" after vaping last night. Still with SOB with coughing only. Productive cough for whitish phlegm, runny nose Frequency: last night Pertinent Negatives: Patient denies  Disposition: [] ED /[] Urgent Care (no appt availability in office) / [x] Appointment(In office/virtual)/ []  [facility]/ [] Home Care/ [] Refused Recommended Disposition /[] [facility]/ []  Follow-up with PCP Additional Notes: Does not have inhalers "I don't take my meds like I should."Appt secured for this AM . Care advise provided per protocol, verbalizes understanding. Reason for Disposition  [1] MILD asthma attack (e.g., no SOB at rest, mild SOB with walking, speaks normally in sentences, mild wheezing) AND [2] lasting > 24 hours on prescribed treatment  Answer Assessment - Initial Assessment Questions 1. RESPIRATORY STATUS: "Describe your breathing?" (e.g., wheezing, shortness of breath, unable to speak, severe coughing)      Cough, SOB after coughing spells 2. ONSET: "When did this asthma attack begin?"      Last night 3. TRIGGER: "What do you think triggered this attack?" (e.g., URI, exposure to pollen or other allergen, tobacco smoke)      Vaping 4. PEAK EXPIRATORY FLOW RATE (PEFR): "Do you use a peak flow meter?" If Yes, ask: "What's the current peak flow? What's your personal best peak flow?"      No 5. SEVERITY: "How bad is this attack?"    - MILD: No SOB at rest, mild SOB with walking, speaks normally in sentences, can lie down, no retractions, pulse < 100. (GREEN Zone: PEFR 80-100%)   - MODERATE: SOB at rest, SOB with minimal exertion and prefers to sit, cannot lie down flat, speaks in phrases, mild retractions, audible wheezing, pulse 100-120. (YELLOW Zone: PEFR 50-79%)    - SEVERE: Struggling for each breath, speaks in single words, struggling to breathe, sitting hunched forward, retractions, usually loud wheezing, sometimes minimal  wheezing because of decreased air movement, pulse > 120. (RED Zone: PEFR < 50%).      SOB with coughing only 6. ASTHMA MEDICINES:  "What treatments have you tried?"    - INHALED QUICK RELIEF (RESCUE): "What is your inhaled quick-relief medicine?" (e.g., albuterol, salbutamol) "Do you use an inhaler or a nebulizer?" "How frequently have you been using this medicine?"   - CONTROLLER (LONG-TERM-CONTROL): "Do you take an inhaled steroid? (e.g., Asmanex, Flovent, Pulmicort, Qvar)     no 7. INHALED QUICK-RELIEF TREATMENTS FOR THIS ATTACK: "What treatments have you given yourself so far?" and "How many and how often?" If using an inhaler, ask, "How many puffs?" Note: Routine treatments are 2 puffs every 4 hours as needed. Rescue treatments are 4 puffs repeated every 20 minutes, up to three times as needed.      None 8. OTHER SYMPTOMS: "Do you have any other symptoms? (e.g., chest pain, coughing up yellow sputum, fever, runny nose)     Cough, productive for whitish, runny nose. 9. O2 SATURATION MONITOR:  "Do you use an oxygen saturation monitor (pulse oximeter) at home?" If Yes, "What is your reading (oxygen level) today?" "What is your usual oxygen saturation reading?" (e.g., 95%)     No  Protocols used: Asthma Attack-A-AH

## 2022-07-20 NOTE — Progress Notes (Signed)
   SUBJECTIVE:   CHIEF COMPLAINT / HPI:   Asthma - Medications: albuterol PRN, singulair, zyrtec. - Taking: has been out of albuterol  - Common triggers: allergies, vaping - ED visits/hospitalization in the last 6 months: reports exacerbations of asthma frequently, ~1x per month - Current symptoms: cough - nocturnal coughing 2-3x per week - had asthma flare last night after vaping. Hasn't been able to get albuterol inhaler, states insurance won't cover.   OBJECTIVE:   BP 119/74 (BP Location: Left Arm, Patient Position: Sitting, Cuff Size: Normal)   Pulse 67   Temp 98.5 F (36.9 C) (Oral)   Resp 16   Wt 165 lb (74.8 kg)   SpO2 99%   BMI 25.09 kg/m   Gen: well appearing, in NAD Card: RRR Lungs: wheezing throughout. Ext: WWP, no edema   ASSESSMENT/PLAN:   Asthma Chronic, with current exacerbation. [employer] formulary, will cover albuterol but may have copay. Refill sent. Given frequent nocturnal symptoms, will trial Qvar. Strongly recommended stopping vaping as this is likely the culprit of his frequent exacerbations. F/u 3 months.     Myles Gip, DO

## 2022-07-20 NOTE — Assessment & Plan Note (Signed)
Chronic, with current exacerbation. [employer] formulary, will cover albuterol but may have copay. Refill sent. Given frequent nocturnal symptoms, will trial Qvar. Strongly recommended stopping vaping as this is likely the culprit of his frequent exacerbations. F/u 3 months.

## 2022-07-22 ENCOUNTER — Other Ambulatory Visit: Payer: Self-pay

## 2022-07-22 ENCOUNTER — Ambulatory Visit (INDEPENDENT_AMBULATORY_CARE_PROVIDER_SITE_OTHER): Payer: 59

## 2022-07-22 DIAGNOSIS — A539 Syphilis, unspecified: Secondary | ICD-10-CM | POA: Diagnosis not present

## 2022-07-22 DIAGNOSIS — R69 Illness, unspecified: Secondary | ICD-10-CM | POA: Diagnosis not present

## 2022-07-22 DIAGNOSIS — Z113 Encounter for screening for infections with a predominantly sexual mode of transmission: Secondary | ICD-10-CM

## 2022-07-22 MED ORDER — PENICILLIN G BENZATHINE 1200000 UNIT/2ML IM SUSY
2.4000 10*6.[IU] | PREFILLED_SYRINGE | Freq: Once | INTRAMUSCULAR | Status: AC
  Administered 2022-07-22: 2.4 10*6.[IU] via INTRAMUSCULAR

## 2022-07-22 NOTE — Progress Notes (Signed)
Patient in office today for Bicillin 2.4 MU x 1. Patient has no Penicillin allergies. Patient tolerated injections well. Patient advised no sex for 10 days. Condoms offered to patient as well. Troy [name]

## 2022-07-23 ENCOUNTER — Other Ambulatory Visit: Payer: Self-pay | Admitting: Family Medicine

## 2022-07-23 LAB — CT/NG RNA, TMA RECTAL
Chlamydia Trachomatis RNA: NOT DETECTED
Neisseria Gonorrhoeae RNA: NOT DETECTED

## 2022-07-23 LAB — GC/CHLAMYDIA PROBE, AMP (THROAT)
Chlamydia trachomatis RNA: NOT DETECTED
Neisseria gonorrhoeae RNA: NOT DETECTED
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
## 2022-07-26 ENCOUNTER — Telehealth: Payer: Self-pay

## 2022-07-26 ENCOUNTER — Ambulatory Visit (INDEPENDENT_AMBULATORY_CARE_PROVIDER_SITE_OTHER): Payer: 59 | Admitting: Psychiatry

## 2022-07-26 ENCOUNTER — Encounter: Payer: Self-pay | Admitting: Psychiatry

## 2022-07-26 ENCOUNTER — Other Ambulatory Visit: Payer: Self-pay | Admitting: Psychiatry

## 2022-07-26 VITALS — BP 108/65 | HR 66 | Temp 97.1°F | Ht 68.0 in | Wt 167.0 lb

## 2022-07-26 DIAGNOSIS — F3132 Bipolar disorder, current episode depressed, moderate: Secondary | ICD-10-CM | POA: Diagnosis not present

## 2022-07-26 DIAGNOSIS — F4310 Post-traumatic stress disorder, unspecified: Secondary | ICD-10-CM | POA: Diagnosis not present

## 2022-07-26 DIAGNOSIS — G4700 Insomnia, unspecified: Secondary | ICD-10-CM

## 2022-07-26 DIAGNOSIS — R69 Illness, unspecified: Secondary | ICD-10-CM | POA: Diagnosis not present

## 2022-07-26 MED ORDER — ESZOPICLONE 2 MG PO TABS: 2.0000 mg | ORAL_TABLET | Freq: Every evening | ORAL | 1 refills | Status: DC | PRN

## 2022-07-26 MED ORDER — SERTRALINE HCL 25 MG PO TABS: 25.0000 mg | ORAL_TABLET | Freq: Every day | ORAL | 1 refills | Status: DC

## 2022-07-26 NOTE — Telephone Encounter (Signed)
pt states a prior Josem Kaufmann is needed for the [place]

## 2022-07-26 NOTE — Telephone Encounter (Signed)
went online to [email or web address] and submitted the prior auth . - pending

## 2022-07-26 NOTE — Telephone Encounter (Signed)
called pharmacy the pharmacist states that insurance will only pay for 15 for 30 days. if any more than a prior auth is needed.  I got information to start a pior auth for 30 for 30.

## 2022-07-28 ENCOUNTER — Ambulatory Visit (INDEPENDENT_AMBULATORY_CARE_PROVIDER_SITE_OTHER): Payer: 59 | Admitting: Clinical

## 2022-07-28 DIAGNOSIS — F4310 Post-traumatic stress disorder, unspecified: Secondary | ICD-10-CM | POA: Diagnosis not present

## 2022-07-28 DIAGNOSIS — F3130 Bipolar disorder, current episode depressed, mild or moderate severity, unspecified: Secondary | ICD-10-CM

## 2022-07-28 DIAGNOSIS — F1091 Alcohol use, unspecified, in remission: Secondary | ICD-10-CM | POA: Diagnosis not present

## 2022-07-28 DIAGNOSIS — R69 Illness, unspecified: Secondary | ICD-10-CM | POA: Diagnosis not present

## 2022-07-28 NOTE — Telephone Encounter (Signed)
received notice that the eszopiclone 81m was approved from 07-28-22 to  07-28-25
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
## 2022-07-28 NOTE — Progress Notes (Signed)
                Laurianne Floresca, LCSW 

## 2022-08-02 ENCOUNTER — Other Ambulatory Visit: Payer: Self-pay | Admitting: Psychiatry

## 2022-08-03 ENCOUNTER — Telehealth: Payer: Self-pay | Admitting: Psychiatry

## 2022-08-03 NOTE — Telephone Encounter (Signed)
tried to call patient and it states to please try your call again later.

## 2022-08-03 NOTE — Telephone Encounter (Signed)
I think the front advised to call the other number. please call him at [phone].thanks.

## 2022-08-03 NOTE — Telephone Encounter (Signed)
tried to call and it states call can not be completed at this time.

## 2022-08-03 NOTE — Telephone Encounter (Signed)
called pharmacy and per the pharm he states that the rx was approved but they have to order medcation.

## 2022-08-03 NOTE — Telephone Encounter (Signed)
When he calls back again, please make sure to get the correct number and provide him the update regarding the medication. Thanks.

## 2022-08-03 NOTE — Telephone Encounter (Signed)
Patient calling to see how the prior authorization is going for his Lunesta. States he is out tonight and thinks he cannot refill it until 08-17-22? Please advise cause he wants to know what he will do until he can refill

## 2022-08-03 NOTE — Telephone Encounter (Signed)
Jess- could you advise on this? I believe we did fill out the paperwork for this. Thanks.

## 2022-08-03 NOTE — Telephone Encounter (Signed)
ERROR

## 2022-08-04 NOTE — Telephone Encounter (Signed)
Spoke to patient's mother per the number patient provided. Gave her the message the prescription was approved but the pharm has to order so he needs to speak to the pharm regarding this issue. It is not on our end. She will let him know.

## 2022-08-10 ENCOUNTER — Ambulatory Visit (INDEPENDENT_AMBULATORY_CARE_PROVIDER_SITE_OTHER): Payer: 59 | Admitting: Internal Medicine

## 2022-08-10 ENCOUNTER — Other Ambulatory Visit: Payer: Self-pay

## 2022-08-10 ENCOUNTER — Encounter: Payer: Self-pay | Admitting: Internal Medicine

## 2022-08-10 ENCOUNTER — Ambulatory Visit (INDEPENDENT_AMBULATORY_CARE_PROVIDER_SITE_OTHER): Payer: 59

## 2022-08-10 VITALS — BP 119/71 | HR 56 | Temp 98.1°F | Resp 16 | Ht 68.0 in | Wt 165.8 lb

## 2022-08-10 DIAGNOSIS — Z23 Encounter for immunization: Secondary | ICD-10-CM | POA: Diagnosis not present

## 2022-08-10 DIAGNOSIS — Z7185 Encounter for immunization safety counseling: Secondary | ICD-10-CM

## 2022-08-10 DIAGNOSIS — B20 Human immunodeficiency virus [HIV] disease: Secondary | ICD-10-CM | POA: Diagnosis not present

## 2022-08-10 DIAGNOSIS — A539 Syphilis, unspecified: Secondary | ICD-10-CM | POA: Diagnosis not present

## 2022-08-10 DIAGNOSIS — R69 Illness, unspecified: Secondary | ICD-10-CM | POA: Diagnosis not present

## 2022-08-10 DIAGNOSIS — Z113 Encounter for screening for infections with a predominantly sexual mode of transmission: Secondary | ICD-10-CM

## 2022-08-10 NOTE — Progress Notes (Signed)
[facility] for Infectious Disease   CHIEF COMPLAINT    HIV follow up.    SUBJECTIVE:    Troy [name] is a 51 y.o. male with PMHx as below who presents to the clinic for HIV follow up.   Please see A&P for the details of today's visit and status of the patient's medical problems.   Patient's Medications  New Prescriptions   No medications on file  Previous Medications   ALBUTEROL (VENTOLIN HFA) 108 (90 BASE) MCG/ACT INHALER    Inhale 2 puffs into the lungs every 6 (six) hours as needed for wheezing or shortness of breath.   ARIPIPRAZOLE (ABILIFY) 15 MG TABLET    Take 1 tablet (15 mg total) by mouth daily.   BECLOMETHASONE (QVAR) 80 MCG/ACT INHALER    Inhale 1 puff into the lungs 2 (two) times daily.   CETIRIZINE (ZYRTEC) 10 MG TABLET    Take 1 tablet (10 mg total) by mouth daily.   ESZOPICLONE (LUNESTA) 2 MG TABS TABLET    Take 1 tablet (2 mg total) by mouth at bedtime as needed for sleep. Take immediately before bedtime   ESZOPICLONE (LUNESTA) 2 MG TABS TABLET    Take 1 tablet (2 mg total) by mouth at bedtime as needed for sleep. Take immediately before bedtime   MONTELUKAST (SINGULAIR) 10 MG TABLET    Take 1 tablet (10 mg total) by mouth daily.   NA SULFATE-K SULFATE-MG SULF 17.5-3.13-1.6 GM/177ML SOLN    SMARTSIG:354 Milliliter(s) By Mouth Once   ODEFSEY 200-25-25 MG TABS TABLET    Take 1 tablet by mouth daily.   OMEPRAZOLE (PRILOSEC) 40 MG CAPSULE    Take 1 capsule (40 mg total) by mouth daily before breakfast.   ONDANSETRON (ZOFRAN ODT) 4 MG DISINTEGRATING TABLET    Take 1 tablet (4 mg total) by mouth every 8 (eight) hours as needed.   SERTRALINE (ZOLOFT) 25 MG TABLET    Take 1 tablet (25 mg total) by mouth at bedtime.   SULFAMETHOXAZOLE-TRIMETHOPRIM (BACTRIM DS) 800-160 MG TABLET    Take 1 tablet by mouth 2 (two) times daily.   SUMATRIPTAN (IMITREX) 100 MG TABLET    TAKE ONE TABLET BY MOUTH AT ONSET OF THE HEADACHE. MAY REPEAT IN TWO HOURS   TIZANIDINE (ZANAFLEX)  4 MG TABLET    TAKE 1 CAPSULE (4 MG TOTAL) BY MOUTH 2 (TWO) TIMES DAILY.   TOPIRAMATE (TOPAMAX) 100 MG TABLET    Take 1 tablet (100 mg total) by mouth 2 (two) times daily.  Modified Medications   No medications on file  Discontinued Medications   No medications on file      Past Medical History:  Diagnosis Date   Asthma    Bipolar 1 disorder ([place])    Diabetes mellitus without complication ([place])    History of CVA with residual deficit    HIV infection ([place])    Nephrolithiasis     Social History   Tobacco Use   Smoking status: Every Day    Packs/day: 1.00    Years: 25.00    Total pack years: 25.00    Types: E-cigarettes, Cigarettes    Last attempt to quit: 2013    Years since quitting: 10.8   Smokeless tobacco: Never  Vaping Use   Vaping Use: Every day  Substance Use Topics   Alcohol use: Not Currently    Comment: 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, active in [place]   Drug use: Not Currently  Comment: previously used, no IV drugs, 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    Family History  Problem Relation Age of Onset   Bipolar disorder Mother    COPD Mother    Diabetes Mother    Hypertension Mother    Hyperlipidemia Mother    Healthy Father    Cancer Father        skin   Stroke Brother    Heart disease Brother    Seizures Brother    Thyroid disease Brother    Diabetes Maternal Grandfather    Hyperlipidemia Maternal Grandfather    Cancer Maternal Grandmother        breast   Hyperlipidemia Maternal Grandmother    Hypertension Maternal Grandmother    Heart disease Paternal Grandfather    Hypertension Paternal Grandfather     Allergies  Allergen Reactions   Niacin Anaphylaxis   Orange Oil Anaphylaxis   Zolpidem     Other reaction(s): Unknown Sleep walking and sleep eating Other reaction(s): Unknown Sleep walking and sleep eating    Review of Systems  Constitutional: Negative.   Respiratory: Negative.    Cardiovascular: Negative.   Genitourinary: Negative.      OBJECTIVE:    Vitals:    08/10/22 1439  BP: 119/71  Pulse: (!) 56  Resp: 16  Temp: 98.1 F (36.7 C)  TempSrc: Oral  SpO2: 100%  Weight: 165 lb 12.8 oz (75.2 kg)  Height: 5' 8"  (1.727 m)     Body mass index is 25.21 kg/m.  Physical Exam Constitutional:      Appearance: Normal appearance.  HENT:     Head: Normocephalic and atraumatic.  Pulmonary:     Effort: Pulmonary effort is normal. No respiratory distress.  Skin:    General: Skin is warm and dry.  Neurological:     General: No focal deficit present.     Mental Status: He is alert and oriented to person, place, and time.  Psychiatric:        Mood and Affect: Mood normal.        Behavior: Behavior normal.     Labs and Microbiology:    Latest Ref Rng & Units 07/14/2022   10:42 AM 12/10/2021   10:27 AM 01/22/2020   11:11 AM  CMP  Glucose 65 - 99 mg/dL 78  94  93   BUN 7 - 25 mg/dL 10  10  9    Creatinine 0.60 - 1.29 mg/dL 1.05  1.22  1.20   Sodium 135 - 146 mmol/L 140  139  142   Potassium 3.5 - 5.3 mmol/L 3.9  4.6  3.9   Chloride 98 - 110 mmol/L 107  102  110   CO2 20 - 32 mmol/L 23  28  25    Calcium 8.6 - 10.3 mg/dL 9.2  10.0  9.4   Total Protein 6.1 - 8.1 g/dL 6.7  7.2  7.2   Total Bilirubin 0.2 - 1.2 mg/dL 0.5  0.6  0.8   Alkaline Phos 38 - 126 U/L   44   AST 10 - 40 U/L 24  23  22    ALT 9 - 46 U/L 25  19  15        Latest Ref Rng & Units 07/14/2022   10:42 AM 12/10/2021   10:27 AM 01/22/2020   11:11 AM  CBC  WBC 3.8 - 10.8 Thousand/uL 5.3  5.9  7.4   Hemoglobin 13.2 - 17.1 g/dL 13.9  16.0  14.9   Hematocrit 38.5 - 50.0 %  39.9  47.1  43.6   Platelets 140 - 400 Thousand/uL 234  222  186      Lab Results  Component Value Date   HIV1RNAQUANT Not Detected 07/14/2022   HIV1RNAQUANT 23 (H) 12/10/2021   CD4TABS 431 07/14/2022    RPR and STI: Lab Results  Component Value Date   LABRPR REACTIVE (A) 07/14/2022   LABRPR NON-REACTIVE 12/10/2021   RPRTITER 1:4 (H) 07/14/2022    STI Results GC CT  07/14/2022 11:44 AM Negative   Negative   10/07/2020 11:52 AM  Negative     Hepatitis B: Lab Results  Component Value Date   HEPBSAB NON-REACTIVE 12/10/2021   HEPBSAG NON-REACTIVE 12/10/2021   HEPBCAB NON-REACTIVE 12/10/2021   Hepatitis C: Lab Results  Component Value Date   HEPCAB NON-REACTIVE 12/10/2021   Hepatitis A: Lab Results  Component Value Date   HAV NON-REACTIVE 12/10/2021   Lipids: Lab Results  Component Value Date   CHOL 176 12/10/2021   TRIG 86 12/10/2021   HDL 54 12/10/2021   CHOLHDL 3.3 12/10/2021   LDLCALC 104 (H) 12/10/2021      ASSESSMENT & PLAN:    HIV infection ([place]) Here for routine HIV follow up.  He was last seen in March 2023 as a new patient.  He has excellent long term control on Odefsey.  Discussed current regimen and he would like to keep on Odefsey.  Refills are on file and recent labs were reassuring.  Follow up in 6 months.  Vaccine counseling Patient offered 2nd dose of hepatitis A vaccine today and agrees.  Also offered flu vaccine which he agrees to as well as CoVID.   Routine screening for STI (sexually transmitted infection) Recent screening was negative for GC/CT.   Syphilis Recent RPR titer was reactive at 1:4 whereas previously non-reactive in March.  Treated with Bicillin 2.4 million units x 1 on 07/22/22.  No further treatment needed and will monitor RPR.     Raynelle Highland for Infectious Disease [facility] 08/10/2022, 2:56 PM

## 2022-08-10 NOTE — Assessment & Plan Note (Signed)
Recent RPR titer was reactive at 1:4 whereas previously non-reactive in March.  Treated with Bicillin 2.4 million units x 1 on 07/22/22.  No further treatment needed and will monitor RPR.

## 2022-08-10 NOTE — Assessment & Plan Note (Signed)
Recent screening was negative for GC/CT.

## 2022-08-10 NOTE — Assessment & Plan Note (Signed)
Here for routine HIV follow up.  He was last seen in March 2023 as a new patient.  He has excellent long term control on Odefsey.  Discussed current regimen and he would like to keep on Odefsey.  Refills are on file and recent labs were reassuring.  Follow up in 6 months.

## 2022-08-10 NOTE — Addendum Note (Signed)
Addended by: Tomi Bamberger on: 08/10/2022 04:22 PM   Modules accepted: Orders

## 2022-08-10 NOTE — Assessment & Plan Note (Signed)
Patient offered 2nd dose of hepatitis A vaccine today and agrees.  Also offered flu vaccine which he agrees to as well as CoVID.

## 2022-08-14 ENCOUNTER — Other Ambulatory Visit: Payer: Self-pay | Admitting: Gastroenterology

## 2022-08-16 ENCOUNTER — Ambulatory Visit: Payer: 59 | Admitting: Clinical

## 2022-08-18 ENCOUNTER — Telehealth: Payer: Self-pay

## 2022-08-18 NOTE — Telephone Encounter (Signed)
Please contact them the reason it was denied. It says in the paperwork that eszopiclone is the alternative, which I ordered.

## 2022-08-18 NOTE — Telephone Encounter (Signed)
received fax that lunest 56m was denied.

## 2022-08-19 ENCOUNTER — Telehealth: Payer: Self-pay

## 2022-08-19 NOTE — Telephone Encounter (Signed)
Troy [name] at [facility] Dept called wanting date of treatment for syphilis on 07/22/22. Troy [name] stated that patient has possibly been exposed to STI again and needs appointment to be seen.   I contacted patient he stated that he has been exposed again and also has a bump on his penis. I scheduled patient for STI testing with Dr.Van Dam on 10/30 at [place], [facility]

## 2022-08-20 ENCOUNTER — Other Ambulatory Visit: Payer: Self-pay | Admitting: Family Medicine

## 2022-08-22 ENCOUNTER — Other Ambulatory Visit (HOSPITAL_COMMUNITY): Payer: Self-pay

## 2022-08-22 ENCOUNTER — Encounter: Payer: Self-pay | Admitting: Infectious Disease

## 2022-08-22 ENCOUNTER — Other Ambulatory Visit (HOSPITAL_COMMUNITY)
Admission: RE | Admit: 2022-08-22 | Discharge: 2022-08-22 | Disposition: A | Discharge status: Home / Self Care | Payer: 59 | Source: Ambulatory Visit | Attending: Infectious Disease | Admitting: Infectious Disease

## 2022-08-22 ENCOUNTER — Ambulatory Visit (INDEPENDENT_AMBULATORY_CARE_PROVIDER_SITE_OTHER): Payer: 59 | Admitting: Infectious Disease

## 2022-08-22 ENCOUNTER — Other Ambulatory Visit: Payer: Self-pay

## 2022-08-22 VITALS — BP 117/72 | HR 63 | Resp 16 | Ht 68.0 in | Wt 166.0 lb

## 2022-08-22 DIAGNOSIS — Z113 Encounter for screening for infections with a predominantly sexual mode of transmission: Secondary | ICD-10-CM | POA: Insufficient documentation

## 2022-08-22 DIAGNOSIS — K219 Gastro-esophageal reflux disease without esophagitis: Secondary | ICD-10-CM | POA: Diagnosis not present

## 2022-08-22 DIAGNOSIS — A539 Syphilis, unspecified: Secondary | ICD-10-CM

## 2022-08-22 DIAGNOSIS — E785 Hyperlipidemia, unspecified: Secondary | ICD-10-CM | POA: Insufficient documentation

## 2022-08-22 DIAGNOSIS — R69 Illness, unspecified: Secondary | ICD-10-CM | POA: Diagnosis not present

## 2022-08-22 DIAGNOSIS — B20 Human immunodeficiency virus [HIV] disease: Secondary | ICD-10-CM | POA: Diagnosis not present

## 2022-08-22 HISTORY — DX: Hyperlipidemia, unspecified: E78.5

## 2022-08-22 MED ORDER — PENICILLIN G BENZATHINE 1200000 UNIT/2ML IM SUSY
1.2000 10*6.[IU] | PREFILLED_SYRINGE | Freq: Once | INTRAMUSCULAR | Status: AC
  Administered 2022-08-22: 1.2 10*6.[IU] via INTRAMUSCULAR

## 2022-08-22 MED ORDER — ATORVASTATIN CALCIUM 20 MG PO TABS: 20.0000 mg | ORAL_TABLET | Freq: Every day | ORAL | 11 refills | Status: DC

## 2022-08-22 MED ORDER — BIKTARVY 50-200-25 MG PO TABS: 1.0000 | ORAL_TABLET | Freq: Every day | ORAL | 11 refills | Status: DC

## 2022-08-22 MED ORDER — DOXYCYCLINE HYCLATE 100 MG PO TABS: ORAL_TABLET | ORAL | 3 refills | Status: DC

## 2022-08-22 NOTE — Progress Notes (Signed)
Subjective:  Chief complaint exposure to syphilis again.    Patient ID: Troy [name], male    DOB: May 21, 1971, 51 y.o.   MRN: [id number]  HPI  Troy [name] is a 51 year old man living with HIV that has been perfectly controlled on Odefsey.  He returns to clinic today due to the fact that he has had sex with a partner who had previously had syphilis and was not treated.  The patient himself was treated for syphilis in September and screened negative for STIs at that time.  In reviewing the patient's medications and sending in refills I discover that he is being prescribed omeprazole along with his Vernell Leep which is an absolute contraindication.  Apparently has been on the proton pump inhibitor for years and is also not been taking the [facility] with chewable food in part due to the fact that he had gastric bypass.  I am fairly shocked that no one has seen this significant red flag drug-drug warning until now he did say that a pharmacist where he filled the meds had said he should not be on both but perhaps if he took them separately it would be okay but I informed him that proton pump inhibitors are absolutely forbidden with Odefsey.  Fortunately his recovery and levels must of been sufficiently high despite the lack of acid in his stomach to optimize absorption that he has remained suppressed but clearly were not going to continue with this recipe for disaster of having him on a rilpivirine based regimen and a proton pump inhibitor.    Past Medical History:  Diagnosis Date   Asthma    Bipolar 1 disorder ([place])    Diabetes mellitus without complication ([place])    History of CVA with residual deficit    HIV infection ([place])    Hyperlipidemia 08/22/2022   Nephrolithiasis     Past Surgical History:  Procedure Laterality Date   COLONOSCOPY     COLONOSCOPY WITH PROPOFOL N/A 05/30/2019   Procedure: COLONOSCOPY WITH PROPOFOL;  Surgeon: Lin Landsman, MD;  Location: [facility] ENDOSCOPY;  Service:  Gastroenterology;  Laterality: N/A;   ELBOW SURGERY Left    fracture   ESOPHAGOGASTRODUODENOSCOPY (EGD) WITH PROPOFOL N/A 05/30/2019   Procedure: ESOPHAGOGASTRODUODENOSCOPY (EGD) WITH PROPOFOL;  Surgeon: Lin Landsman, MD;  Location: [place];  Service: Gastroenterology;  Laterality: N/A;   ESOPHAGOGASTRODUODENOSCOPY (EGD) WITH PROPOFOL N/A 09/12/2019   Procedure: ESOPHAGOGASTRODUODENOSCOPY (EGD) WITH PROPOFOL;  Surgeon: Lin Landsman, MD;  Location[facility] ENDOSCOPY;  Service: Gastroenterology;  Laterality: N/A;   FACIAL COSMETIC SURGERY     GASTRIC BYPASS     HERNIA REPAIR     KIDNEY STONE SURGERY     renal artery repair      Family History  Problem Relation Age of Onset   Bipolar disorder Mother    COPD Mother    Diabetes Mother    Hypertension Mother    Hyperlipidemia Mother    Healthy Father    Cancer Father        skin   Stroke Brother    Heart disease Brother    Seizures Brother    Thyroid disease Brother    Diabetes Maternal Grandfather    Hyperlipidemia Maternal Grandfather    Cancer Maternal Grandmother        breast   Hyperlipidemia Maternal Grandmother    Hypertension Maternal Grandmother    Heart disease Paternal Grandfather    Hypertension Paternal Grandfather       Social History   Socioeconomic  History   Marital status: Single    Spouse name: Not on file   Number of children: 0   Years of education: Not on file   Highest education level: Not on file  Occupational History   Occupation: Radio broadcast assistant    Employer: [employer]  Tobacco Use   Smoking status: Every Day    Packs/day: 1.00    Years: 25.00    Total pack years: 25.00    Types: E-cigarettes, Cigarettes    Last attempt to quit: 2013    Years since quitting: 10.8   Smokeless tobacco: Never  Vaping Use   Vaping Use: Every day  Substance and Sexual Activity   Alcohol use: Not Currently    Comment: 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, active in [place]   Drug use: Not Currently    Comment:  previously used, no IV drugs, 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   Sexual activity: Not Currently    Partners: Male    Comment: patient given condoms  Other Topics Concern   Not on file  Social History Narrative   Not on file   Social Determinants of Health   Financial Resource Strain: Not on file  Food Insecurity: Not on file  Transportation Needs: Not on file  Physical Activity: Not on file  Stress: Not on file  Social Connections: Not on file    Allergies  Allergen Reactions   Niacin Anaphylaxis   Orange Oil Anaphylaxis   Zolpidem     Other reaction(s): Unknown Sleep walking and sleep eating Other reaction(s): Unknown Sleep walking and sleep eating     Current Outpatient Medications:    albuterol (VENTOLIN HFA) 108 (90 Base) MCG/ACT inhaler, Inhale 2 puffs into the lungs every 6 (six) hours as needed for wheezing or shortness of breath., Disp: 8 g, Rfl: 2   ARIPiprazole (ABILIFY) 15 MG tablet, Take 1 tablet (15 mg total) by mouth daily., Disp: 90 tablet, Rfl: 0   atorvastatin (LIPITOR) 20 MG tablet, Take 1 tablet (20 mg total) by mouth daily., Disp: 30 tablet, Rfl: 11   beclomethasone (QVAR) 80 MCG/ACT inhaler, Inhale 1 puff into the lungs 2 (two) times daily., Disp: 1 each, Rfl: 2   bictegravir-emtricitabine-tenofovir AF (BIKTARVY) 50-200-25 MG TABS tablet, Take 1 tablet by mouth daily., Disp: 30 tablet, Rfl: 11   cetirizine (ZYRTEC) 10 MG tablet, Take 1 tablet (10 mg total) by mouth daily., Disp: 90 tablet, Rfl: 3   doxycycline (VIBRA-TABS) 100 MG tablet, 2 tablets after sex to prevent STI, Disp: 60 tablet, Rfl: 3   eszopiclone (LUNESTA) 2 MG TABS tablet, Take 1 tablet (2 mg total) by mouth at bedtime as needed for sleep. Take immediately before bedtime, Disp: 15 tablet, Rfl: 0   eszopiclone (LUNESTA) 2 MG TABS tablet, Take 1 tablet (2 mg total) by mouth at bedtime as needed for sleep. Take immediately before bedtime, Disp: 30 tablet, Rfl: 1   montelukast (SINGULAIR) 10 MG tablet, Take 1  tablet (10 mg total) by mouth daily., Disp: 90 tablet, Rfl: 3   Na Sulfate-K Sulfate-Mg Sulf 17.5-3.13-1.6 GM/177ML SOLN, SMARTSIG:354 Milliliter(s) By Mouth Once, Disp: , Rfl:    omeprazole (PRILOSEC) 40 MG capsule, TAKE 1 CAPSULE BY MOUTH EVERY DAY BEFORE BREAKFAST, Disp: 30 capsule, Rfl: 1   ondansetron (ZOFRAN ODT) 4 MG disintegrating tablet, Take 1 tablet (4 mg total) by mouth every 8 (eight) hours as needed., Disp: 20 tablet, Rfl: 0   sertraline (ZOLOFT) 25 MG tablet, Take 1 tablet (25 mg total) by mouth at  bedtime., Disp: 30 tablet, Rfl: 1   SUMAtriptan (IMITREX) 100 MG tablet, TAKE ONE TABLET BY MOUTH AT ONSET OF THE HEADACHE. MAY REPEAT IN TWO HOURS, Disp: 10 tablet, Rfl: 3   tiZANidine (ZANAFLEX) 4 MG tablet, TAKE 1 CAPSULE (4 MG TOTAL) BY MOUTH 2 (TWO) TIMES DAILY., Disp: 60 tablet, Rfl: 0   topiramate (TOPAMAX) 100 MG tablet, Take 1 tablet (100 mg total) by mouth 2 (two) times daily., Disp: 180 tablet, Rfl: 3   Review of Systems  Constitutional:  Negative for activity change, appetite change, chills, diaphoresis, fatigue, fever and unexpected weight change.  HENT:  Negative for congestion, rhinorrhea, sinus pressure, sneezing, sore throat and trouble swallowing.   Eyes:  Negative for photophobia and visual disturbance.  Respiratory:  Negative for cough, chest tightness, shortness of breath, wheezing and stridor.   Cardiovascular:  Negative for chest pain, palpitations and leg swelling.  Gastrointestinal:  Negative for abdominal distention, abdominal pain, anal bleeding, blood in stool, constipation, diarrhea, nausea and vomiting.  Genitourinary:  Negative for difficulty urinating, dysuria, flank pain and hematuria.  Musculoskeletal:  Negative for arthralgias, back pain, gait problem, joint swelling and myalgias.  Skin:  Negative for color change, pallor, rash and wound.  Neurological:  Negative for dizziness, tremors, weakness and light-headedness.  Hematological:  Negative for  adenopathy. Does not bruise/bleed easily.  Psychiatric/Behavioral:  Negative for agitation, behavioral problems, confusion, decreased concentration, dysphoric mood and sleep disturbance.        Objective:   Physical Exam Constitutional:      Appearance: He is well-developed.  HENT:     Head: Normocephalic and atraumatic.  Eyes:     Conjunctiva/sclera: Conjunctivae normal.  Cardiovascular:     Rate and Rhythm: Normal rate and regular rhythm.  Pulmonary:     Effort: Pulmonary effort is normal. No respiratory distress.     Breath sounds: No wheezing.  Abdominal:     General: There is no distension.     Palpations: Abdomen is soft.  Musculoskeletal:        General: No tenderness. Normal range of motion.     Cervical back: Normal range of motion and neck supple.  Skin:    General: Skin is warm and dry.     Coloration: Skin is not pale.     Findings: No erythema or rash.  Neurological:     General: No focal deficit present.     Mental Status: He is alert and oriented to person, place, and time.  Psychiatric:        Mood and Affect: Mood normal.        Behavior: Behavior normal.        Thought Content: Thought content normal.        Judgment: Judgment normal.           Assessment & Plan:   Exposure to syphilis: We will give him a shot of penicillin 2,400,000 units and rescreen with RPR and also screen for other STIs gonorrhea and Chlamydia testing and urine oropharynx and rectum.  I am also prescribing him doxycycline postexposure prophylaxis  HIV disease: The patient's regimen needs to be changed and going to switch him up to [place].  I have him come back in a month for rechecking viral load again I will also check a viral load today to ensure he is still suppressed on the [facility] despite being on the proton pump inibitror   Hyperlipidemia: I reviewed the results from the reprieve study and I am  going to initiate atorvastatin in him based on his coverage.  I spent  42 minutes with the patient including than 50% of the time in face to face counseling of the patient guarding his HIV regimen the drug drug interaction issues STI prevention and testing including doxycycline along with review of medical records in preparation for the visit and during the visit and in coordination of his care.

## 2022-08-22 NOTE — Telephone Encounter (Signed)
Requested medications are due for refill today.  yes  Requested medications are on the active medications list.  yes  Last refill. 07/25/2022 #60 0 rf  Future visit scheduled.   yes  Notes to clinic.  Refill not delegated.    Requested Prescriptions  Pending Prescriptions Disp Refills   tiZANidine (ZANAFLEX) 4 MG tablet [Pharmacy Med Name: TIZANIDINE HCL 4 MG TABLET] 180 tablet 1    Sig: TAKE 1 CAPSULE (4 MG TOTAL) BY MOUTH 2 (TWO) TIMES DAILY.     Not Delegated - Cardiovascular:  Alpha-2 Agonists - tizanidine Failed - 08/20/2022  1:31 PM      Failed - This refill cannot be delegated      Passed - Valid encounter within last 6 months    Recent Outpatient Visits           1 month ago Mild intermittent asthma without complication   [place], DO   1 month ago Need for prophylactic vaccination and inoculation against influenza   [facility] [place], [place], PA-C   2 months ago Boil of buttock   [employer], [place], PA-C   3 months ago Sales executive for annual health examination   [employer], [place], PA-C   6 months ago Dyslipidemia   [employer], Dionne Bucy, MD       Future Appointments             In 1 month Tommy Medal, Lavell Islam, MD [facility]   In 1 month Vanga, Tally Due, MD [place]   In 1 month Bacigalupo, Dionne Bucy, MD [facility], [place]

## 2022-08-23 LAB — CYTOLOGY, (ORAL, ANAL, URETHRAL) ANCILLARY ONLY
Chlamydia: NEGATIVE
Chlamydia: NEGATIVE
Comment: NEGATIVE
Comment: NEGATIVE
Comment: NORMAL
Comment: NORMAL
Neisseria Gonorrhea: NEGATIVE
Neisseria Gonorrhea: NEGATIVE

## 2022-08-23 LAB — URINE CYTOLOGY ANCILLARY ONLY
Chlamydia: NEGATIVE
Comment: NEGATIVE
Comment: NORMAL
Neisseria Gonorrhea: NEGATIVE

## 2022-08-25 LAB — HIV RNA, RTPCR W/R GT (RTI, PI,INT)
HIV 1 RNA Quant: <20 copies/mL — AB
HIV-1 RNA Quant, Log: <1.3 Log copies/mL — AB

## 2022-08-25 LAB — HEPATITIS C RNA QUANTITATIVE
HCV Quantitative Log: <1.18 log IU/mL
HCV RNA, PCR, QN: <15 IU/mL

## 2022-08-25 LAB — RPR: RPR Ser Ql: REACTIVE — AB

## 2022-08-25 LAB — FLUORESCENT TREPONEMAL AB(FTA)-IGG-BLD: Fluorescent Treponemal ABS: REACTIVE — AB

## 2022-08-25 LAB — RPR TITER: RPR Titer: 1:2 {titer} — ABNORMAL HIGH

## 2022-08-26 ENCOUNTER — Telehealth: Payer: Self-pay | Admitting: Psychiatry

## 2022-08-26 IMAGING — CR DG ABDOMEN 1V
1 series · 2 of 2 positions shown · non-contrast
Comparison: 12/10/2019

CLINICAL DATA: Kidney stones

EXAM:
ABDOMEN - 1 VIEW

[Series 1: dg abd 1 view · 0.14mm/px · 2 of 2 slices shown]
[im 1/2]
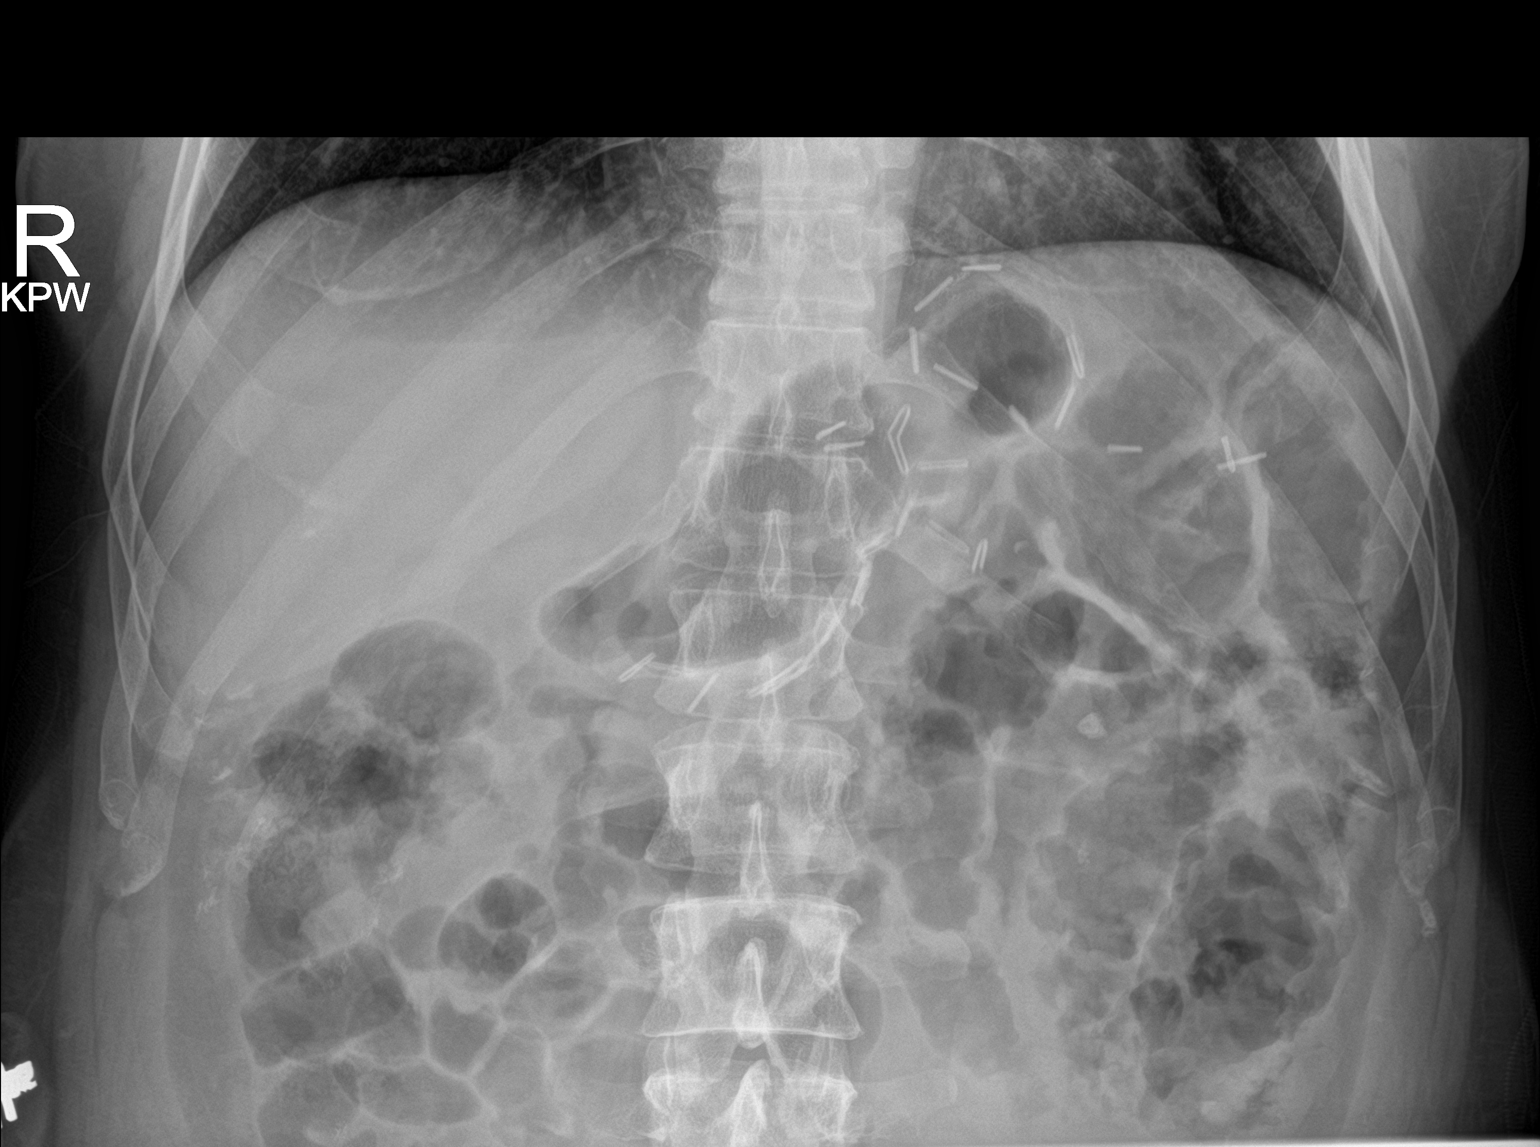
[im 2/2]
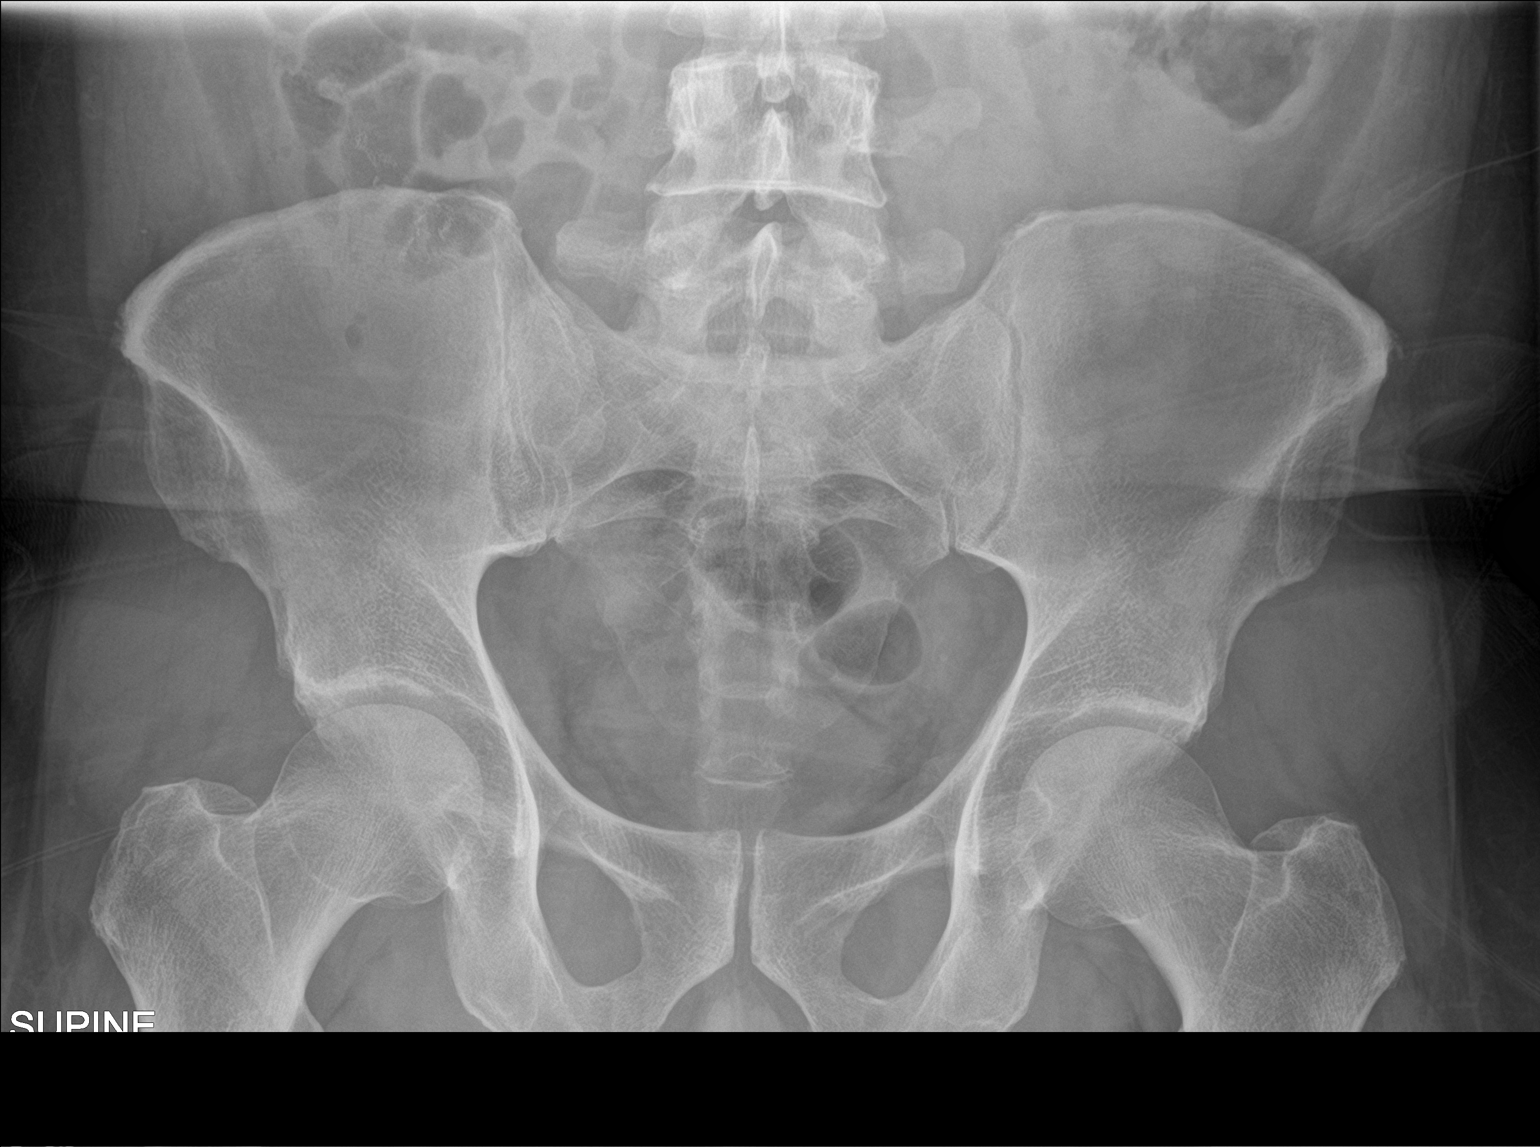

[2 of 2 positions shown; findings below may reference images not displayed]

FINDINGS: Extensive gas and stool overlies the kidneys. No right renal calculi
identified. There are 2 stones within the left kidney the largest of
which measures 7 mm. These appear similar to previous exam. No
calcifications identified within the expected location of the
ureters or urinary bladder. Postsurgical changes within the left
upper quadrant of the abdomen are again noted with multiple surgical
staples.
IMPRESSION: 1. Left renal calculi, similar to previous exam.

## 2022-08-29 ENCOUNTER — Other Ambulatory Visit: Payer: Self-pay | Admitting: Gastroenterology

## 2022-09-08 ENCOUNTER — Ambulatory Visit (INDEPENDENT_AMBULATORY_CARE_PROVIDER_SITE_OTHER): Payer: 59 | Admitting: Clinical

## 2022-09-08 DIAGNOSIS — F1091 Alcohol use, unspecified, in remission: Secondary | ICD-10-CM | POA: Diagnosis not present

## 2022-09-08 DIAGNOSIS — F4310 Post-traumatic stress disorder, unspecified: Secondary | ICD-10-CM

## 2022-09-08 DIAGNOSIS — F3130 Bipolar disorder, current episode depressed, mild or moderate severity, unspecified: Secondary | ICD-10-CM

## 2022-09-08 DIAGNOSIS — R69 Illness, unspecified: Secondary | ICD-10-CM | POA: Diagnosis not present

## 2022-09-08 NOTE — Progress Notes (Signed)
                Reyne Falconi, LCSW 

## 2022-09-08 NOTE — Progress Notes (Signed)
[facility] Counselor/Therapist Progress Note  Patient ID: Troy [name], MRN: [id number],    Date: 09/08/2022  Time Spent: 8:36am - 9:16am : 40 minutes   Treatment Type: Individual Therapy  Reported Symptoms: Patient reported depressed mood and reported decreased concentration is affecting his work.   Mental Status Exam: Appearance:  Well Groomed     Behavior: Appropriate  Motor: Normal  Speech/Language:  Clear and Coherent  Affect: Appropriate  Mood: depressed  Thought process: normal  Thought content:   WNL  Sensory/Perceptual disturbances:   WNL  Orientation: oriented to person, place, and situation  Attention: Good  Concentration: Good  Memory: WNL  Fund of knowledge:  Good  Insight:   Fair  Judgment:  Fair  Impulse Control: Poor   Risk Assessment: Danger to Self:  No Patient denied current suicidal ideation Self-injurious Behavior: No Danger to Others: No Patient denied current homicidal ideation Duty to Warn:no Physical Aggression / Violence:No  Access to Firearms a concern: No  Gang Involvement:No   Subjective: Patient reported recent changes in employment since last session. Patient reported his parents want him to move out of their house and is requiring patient move out by December 15th. Patient reported conflict with parents and the upcoming move are current stressors. Patient stated, "I'm really depressed" in response to current stressors. Patient reported he has been staying in his room. Patient reported recent increase in Abilify to 61m and reported feeling the Abilify is helpful. Patient reported he follows up with his psychiatrist every 3 months.  Patient reported psychiatrist prescribed an antidepressant, Sertraline HCL 232m during recent psychiatry appointment. Patient stated, "I had to force myself to come, to get out of bed today". Patient reported feeling he needs to resume attending [facility] but reported he has not attended due to  depressed mood and financial stressors. Patient stated, "I need to be in AA". Patient reported AA offers virtual attendance but he prefers to attend in person. Patient stated, "I could if I have to" in regards to attending virtually. Patient stated, "that'll be fine" in response to the recommendation for a referral to a provider that offers trauma focused therapy.    Interventions: Clinician reviewed diagnoses and treatment recommendations. Provided psycho education related to diagnoses, treatment recommendations, and psychotropic medications. Clinician provided referral information for trauma focused therapy providers in the area. Discussed the option of attending AA virtually due to financial stressors.   Diagnosis:Bipolar I disorder, most recent episode depressed (HCTrimble PTSD (post-traumatic stress disorder)  Alcohol use disorder in remission  Plan: Clinician provided referral information for trauma focused therapy providers in the area.   KaKatherina RightLCSW
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
## 2022-09-19 ENCOUNTER — Other Ambulatory Visit: Payer: Self-pay | Admitting: Psychiatry

## 2022-09-19 ENCOUNTER — Ambulatory Visit: Payer: 59 | Admitting: Psychiatry

## 2022-09-19 DIAGNOSIS — F3132 Bipolar disorder, current episode depressed, moderate: Secondary | ICD-10-CM

## 2022-09-19 DIAGNOSIS — F4310 Post-traumatic stress disorder, unspecified: Secondary | ICD-10-CM

## 2022-09-19 DIAGNOSIS — G4700 Insomnia, unspecified: Secondary | ICD-10-CM

## 2022-09-19 MED ORDER — ESZOPICLONE 2 MG PO TABS: 2.0000 mg | ORAL_TABLET | Freq: Every evening | ORAL | 0 refills | Status: DC | PRN

## 2022-09-19 NOTE — Telephone Encounter (Signed)
Pt came in for a 1030 appointment. The appt was actually scheduled for 8am. After looking at pts MyChart account, it said 1030 not 8am. Please do not charge pt no show fee. He has rescheduled for 12/12 and is requesting refill on medications until then.

## 2022-09-19 NOTE — Telephone Encounter (Signed)
Noted. Refill of Lunesta was sent (I believe he has other meds)

## 2022-09-20 ENCOUNTER — Other Ambulatory Visit (HOSPITAL_COMMUNITY): Payer: Self-pay

## 2022-09-21 ENCOUNTER — Ambulatory Visit: Payer: 59 | Admitting: Infectious Disease

## 2022-09-21 ENCOUNTER — Ambulatory Visit: Payer: 59 | Admitting: Gastroenterology

## 2022-09-23 DIAGNOSIS — Z419 Encounter for procedure for purposes other than remedying health state, unspecified: Secondary | ICD-10-CM | POA: Diagnosis not present

## 2022-09-26 ENCOUNTER — Other Ambulatory Visit: Payer: Self-pay | Admitting: Psychiatry

## 2022-09-26 ENCOUNTER — Ambulatory Visit: Payer: 59 | Admitting: Physician Assistant

## 2022-09-26 ENCOUNTER — Encounter: Payer: Self-pay | Admitting: Family Medicine

## 2022-09-26 ENCOUNTER — Ambulatory Visit (INDEPENDENT_AMBULATORY_CARE_PROVIDER_SITE_OTHER): Payer: 59 | Admitting: Family Medicine

## 2022-09-26 VITALS — BP 112/67 | HR 61 | Temp 98.0°F | Ht 68.0 in | Wt 161.9 lb

## 2022-09-26 DIAGNOSIS — R69 Illness, unspecified: Secondary | ICD-10-CM | POA: Diagnosis not present

## 2022-09-26 DIAGNOSIS — B20 Human immunodeficiency virus [HIV] disease: Secondary | ICD-10-CM

## 2022-09-26 DIAGNOSIS — Z599 Problem related to housing and economic circumstances, unspecified: Secondary | ICD-10-CM

## 2022-09-26 DIAGNOSIS — J4531 Mild persistent asthma with (acute) exacerbation: Secondary | ICD-10-CM

## 2022-09-26 MED ORDER — PREDNISONE 20 MG PO TABS: 40.0000 mg | ORAL_TABLET | Freq: Every day | ORAL | 0 refills | Status: AC

## 2022-09-26 NOTE — Assessment & Plan Note (Signed)
Chronic with current exacerbation likely precipitated by viral URI. No evidence for superimposed PNA on exam today. Will provide steroid burst. Still vaping, counseled on cessation. Also having trouble affording Qvar though on formulary, see separate problem for plan. Has PCP f/u in 10 days.

## 2022-09-26 NOTE — Assessment & Plan Note (Signed)
Having trouble affording maintenance asthma inhaler. Currently living in car and having trouble obtaining food outside of work. Referral placed to social work for resources.
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
## 2022-09-27 ENCOUNTER — Telehealth: Payer: Self-pay | Admitting: *Deleted

## 2022-09-27 ENCOUNTER — Encounter: Payer: Self-pay | Admitting: Family Medicine

## 2022-09-27 NOTE — Progress Notes (Signed)
  Care Coordination   Note   09/27/2022 Name: Troy [name] MRN: [id number] DOB: 10/09/1971  Troy [name] is a 51 y.o. year old male who sees Bacigalupo, Dionne Bucy, MD for primary care. I reached out to [name] by phone today to offer care coordination services.  [name] was given information about Care Coordination services today including:   The Care Coordination services include support from the care team which includes your Nurse Coordinator, Clinical Social Worker, or Pharmacist.  The Care Coordination team is here to help remove barriers to the health concerns and goals most important to you. Care Coordination services are voluntary, and the patient may decline or stop services at any time by request to their care team member.   Care Coordination Consent Status: Patient agreed to services and verbal consent obtained.   Follow up plan:  Telephone appointment with care coordination team member scheduled for:  09/30/2022  Encounter Outcome:  Pt. Scheduled from referral   Julian Hy, [place] Direct Dial: [phone]

## 2022-09-29 ENCOUNTER — Encounter: Payer: Self-pay | Admitting: Family Medicine

## 2022-09-29 ENCOUNTER — Ambulatory Visit (INDEPENDENT_AMBULATORY_CARE_PROVIDER_SITE_OTHER): Payer: 59 | Admitting: Family Medicine

## 2022-09-29 VITALS — BP 120/77 | HR 87 | Temp 98.8°F | Resp 20 | Ht 68.0 in | Wt 157.0 lb

## 2022-09-29 DIAGNOSIS — Z5902 Unsheltered homelessness: Secondary | ICD-10-CM | POA: Diagnosis not present

## 2022-09-29 DIAGNOSIS — J4531 Mild persistent asthma with (acute) exacerbation: Secondary | ICD-10-CM | POA: Diagnosis not present

## 2022-09-29 DIAGNOSIS — H66011 Acute suppurative otitis media with spontaneous rupture of ear drum, right ear: Secondary | ICD-10-CM | POA: Diagnosis not present

## 2022-09-29 MED ORDER — HYDROCOD POLI-CHLORPHE POLI ER 10-8 MG/5ML PO SUER: 5.0000 mL | Freq: Two times a day (BID) | ORAL | 0 refills | Status: DC | PRN

## 2022-09-29 MED ORDER — BREZTRI AEROSPHERE 160-9-4.8 MCG/ACT IN AERO: 2.0000 | INHALATION_SPRAY | Freq: Two times a day (BID) | RESPIRATORY_TRACT | 11 refills | Status: DC

## 2022-09-29 MED ORDER — AMOXICILLIN-POT CLAVULANATE 875-125 MG PO TABS: 1.0000 | ORAL_TABLET | Freq: Two times a day (BID) | ORAL | 0 refills | Status: DC

## 2022-09-29 NOTE — Progress Notes (Signed)
Established patient visit   Patient: Troy [name]   DOB: Jun 27, 1971   51 y.o. Male  MRN: [id number] Visit Date: 09/29/2022  Today's healthcare provider: Gwyneth Sprout, FNP  I,Tiffany J Bragg,acting as a scribe for Gwyneth Sprout, FNP.,have documented all relevant documentation on the behalf of Gwyneth Sprout, FNP,as directed by  Gwyneth Sprout, FNP while in the presence of Gwyneth Sprout, FNP.   Chief Complaint  Patient presents with   Cough    Patient complains of cough, fever, ear pain, starting 2 weeks ago. Was on prednisone with no relief. Does not have any inhalers due to financial difficulty. Has been using nebulizers. Is working with a Education officer, museum to get medications.    Subjective    HPI HPI     Cough    Additional comments: Patient complains of cough, fever, ear pain, starting 2 weeks ago. Was on prednisone with no relief. Does not have any inhalers due to financial difficulty. Has been using nebulizers. Is working with a Education officer, museum to get medications.       Last edited by Smitty Knudsen, CMA on 09/29/2022  4:15 PM.      Medications: Outpatient Medications Prior to Visit  Medication Sig   albuterol (VENTOLIN HFA) 108 (90 Base) MCG/ACT inhaler Inhale 2 puffs into the lungs every 6 (six) hours as needed for wheezing or shortness of breath.   ARIPiprazole (ABILIFY) 15 MG tablet Take 1 tablet (15 mg total) by mouth daily.   atorvastatin (LIPITOR) 20 MG tablet Take 1 tablet (20 mg total) by mouth daily.   beclomethasone (QVAR) 80 MCG/ACT inhaler Inhale 1 puff into the lungs 2 (two) times daily.   bictegravir-emtricitabine-tenofovir AF (BIKTARVY) 50-200-25 MG TABS tablet Take 1 tablet by mouth daily.   cetirizine (ZYRTEC) 10 MG tablet Take 1 tablet (10 mg total) by mouth daily.   eszopiclone (LUNESTA) 2 MG TABS tablet Take 1 tablet (2 mg total) by mouth at bedtime as needed for sleep. Take immediately before bedtime   [START ON 10/03/2022] eszopiclone (LUNESTA) 2 MG  TABS tablet Take 1 tablet (2 mg total) by mouth at bedtime as needed for sleep. Take immediately before bedtime   montelukast (SINGULAIR) 10 MG tablet Take 1 tablet (10 mg total) by mouth daily.   Na Sulfate-K Sulfate-Mg Sulf 17.5-3.13-1.6 GM/177ML SOLN SMARTSIG:354 Milliliter(s) By Mouth Once   omeprazole (PRILOSEC) 40 MG capsule TAKE 1 CAPSULE BY MOUTH EVERY DAY BEFORE BREAKFAST   ondansetron (ZOFRAN ODT) 4 MG disintegrating tablet Take 1 tablet (4 mg total) by mouth every 8 (eight) hours as needed.   predniSONE (DELTASONE) 20 MG tablet Take 2 tablets (40 mg total) by mouth daily with breakfast for 5 days.   sertraline (ZOLOFT) 25 MG tablet Take 1 tablet (25 mg total) by mouth at bedtime.   SUMAtriptan (IMITREX) 100 MG tablet TAKE ONE TABLET BY MOUTH AT ONSET OF THE HEADACHE. MAY REPEAT IN TWO HOURS   tiZANidine (ZANAFLEX) 4 MG tablet TAKE 1 CAPSULE (4 MG TOTAL) BY MOUTH 2 (TWO) TIMES DAILY.   topiramate (TOPAMAX) 100 MG tablet Take 1 tablet (100 mg total) by mouth 2 (two) times daily.   [DISCONTINUED] doxycycline (VIBRA-TABS) 100 MG tablet 2 tablets after sex to prevent STI   No facility-administered medications prior to visit.    Review of Systems    Objective    BP 120/77 (BP Location: Left Arm, Patient Position: Sitting, Cuff Size: Normal)   Pulse  87   Temp 98.8 F (37.1 C) (Oral)   Resp 20   Ht 5' 8"  (1.727 m)   Wt 157 lb (71.2 kg)   SpO2 95%   BMI 23.87 kg/m   Physical Exam Vitals and nursing note reviewed.  Constitutional:      General: He is not in acute distress.    Appearance: Normal appearance. He is normal weight. He is not ill-appearing, toxic-appearing or diaphoretic.  HENT:     Head: Normocephalic and atraumatic.     Right Ear: Ear canal and external ear normal. Drainage and swelling present. A middle ear effusion is present. Tympanic membrane is perforated.     Left Ear: Ear canal and external ear normal. A middle ear effusion is present.     Nose: Congestion  and rhinorrhea present.  Eyes:     Pupils: Pupils are equal, round, and reactive to light.  Cardiovascular:     Rate and Rhythm: Normal rate and regular rhythm.     Pulses: Normal pulses.     Heart sounds: Normal heart sounds.  Pulmonary:     Effort: Pulmonary effort is normal.     Breath sounds: Normal breath sounds.  Musculoskeletal:        General: Normal range of motion.     Cervical back: Normal range of motion.  Skin:    General: Skin is warm and dry.     Capillary Refill: Capillary refill takes less than 2 seconds.  Neurological:     General: No focal deficit present.     Mental Status: He is alert and oriented to person, place, and time. Mental status is at baseline.  Psychiatric:        Mood and Affect: Mood normal.        Behavior: Behavior normal.        Thought Content: Thought content normal.        Judgment: Judgment normal.    No results found for any visits on 09/29/22.  Assessment & Plan     Problem List Items Addressed This Visit       Respiratory   Mild persistent asthma with exacerbation    S/p prednisone burst at 80 mg daily; slight improvement however, wheezing at this time in upper lungs which was cleared with chest PT and coughing Samples of breztri provided to assist given current inability to afford qvar      Relevant Medications   chlorpheniramine-HYDROcodone (TUSSIONEX) 10-8 MG/5ML   Budeson-Glycopyrrol-Formoterol (BREZTRI AEROSPHERE) 160-9-4.8 MCG/ACT AERO     Nervous and Auditory   Non-recurrent acute suppurative otitis media of right ear with spontaneous rupture of tympanic membrane - Primary    Undiagnosed URI; negative for flu/covid per pt report R ear with ruptured ear drum; slight bleed noted. Pt notes normal hearing; however, pain present and low grade fevers noted Sick contacts x3 RTC if symptoms do not improve      Relevant Medications   amoxicillin-clavulanate (AUGMENTIN) 875-125 MG tablet     Other   Unsheltered  homelessness    Reports current living situation is in his car; has parents and a SO locally Has upcoming appt with SW to assist Denies further assistance at this time       Return if symptoms worsen or fail to improve.     Vonna Kotyk, FNP, have reviewed all documentation for this visit. The documentation on 09/29/22 for the exam, diagnosis, procedures, and orders are all accurate and complete.  Jaci Standard  Rollene Rotunda, [place] [phone] (phone) [phone] (fax)  [place]

## 2022-09-29 NOTE — Assessment & Plan Note (Signed)
Reports current living situation is in his car; has parents and a SO locally Has upcoming appt with SW to assist Denies further assistance at this time

## 2022-09-29 NOTE — Assessment & Plan Note (Signed)
S/p prednisone burst at 80 mg daily; slight improvement however, wheezing at this time in upper lungs which was cleared with chest PT and coughing Samples of breztri provided to assist given current inability to afford qvar

## 2022-09-29 NOTE — Assessment & Plan Note (Signed)
Undiagnosed URI; negative for flu/covid per pt report R ear with ruptured ear drum; slight bleed noted. Pt notes normal hearing; however, pain present and low grade fevers noted Sick contacts x3 RTC if symptoms do not improve

## 2022-09-30 ENCOUNTER — Ambulatory Visit: Payer: Self-pay

## 2022-09-30 DIAGNOSIS — J4520 Mild intermittent asthma, uncomplicated: Secondary | ICD-10-CM

## 2022-09-30 NOTE — Patient Instructions (Signed)
Visit Information  Thank you for taking time to visit with me today. Please don't hesitate to contact me if I can be of assistance to you.   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 the past and has made it difficult to obtain housing - patient currently lives in his car Discussed the patient has been sober for 7 years and does attend meetings such as NA and AA Education on opportunity to apply for housing in an [place] which is sobriety housing - patient will look into this option at a meeting he plans to attend tonight Determined the patient has difficulty affording his medication and will be unable to purchase an inhaler once his current one runs out of medication Advised the patient SW will place a referral to our pharmacy team for assistance Referral placed to [place] team         If you are experiencing a Mental Health or [place] or need someone to talk to, please call 1-800-273-TALK (toll free, 24 hour hotline)  Patient verbalizes understanding of instructions and care plan provided today and agrees to view in [place]. Active MyChart status and patient understanding of how to access instructions and care plan via MyChart confirmed with patient.     No further follow up required: You will be contacted by a member of the pharmacy team to discuss options for your inhaler.  Daneen Schick, BSW, CDP Social Worker, Certified Dementia Practitioner [facility]  Care Coordination [phone]

## 2022-09-30 NOTE — Patient Outreach (Signed)
  Care Coordination   Initial Visit Note   09/30/2022 Name: Troy [name] MRN: [id number] DOB: 1971/08/25  Troy [name] is a 50 y.o. year old male who sees Bacigalupo, Dionne Bucy, MD for primary care. I spoke with  Troy [name] by phone today.  What matters to the patients health and wellness today?  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 which is sobriety housing - patient will look into this option at a meeting he plans to attend tonight Determined the patient has difficulty affording his medication and will be unable to purchase an inhaler once his current one runs out of medication Advised the patient SW will place a referral to our pharmacy team for assistance Referral placed to [place] team         SDOH assessments and interventions completed:  Yes  SDOH Interventions Today    Flowsheet Row Most Recent Value  SDOH Interventions   Food Insecurity Interventions Intervention Not Indicated  Housing Interventions Other (Comment)  [Educated on [facility]/halfway house]  Transportation Interventions Intervention Not Indicated  Financial Strain Interventions Other (Comment)  [Referral to [place] team for medication assistance]        Care Coordination Interventions:  Yes, provided   Follow up plan: Referral made to [place] team    Encounter Outcome:  Pt. Visit Completed   Daneen Schick, Arita Miss, CDP Social Worker, Certified Dementia Practitioner [facility]  Care  Coordination [phone]
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
## 2022-10-04 ENCOUNTER — Encounter: Payer: Self-pay | Admitting: Psychiatry

## 2022-10-04 ENCOUNTER — Ambulatory Visit (INDEPENDENT_AMBULATORY_CARE_PROVIDER_SITE_OTHER): Payer: 59 | Admitting: Psychiatry

## 2022-10-04 DIAGNOSIS — F4310 Post-traumatic stress disorder, unspecified: Secondary | ICD-10-CM

## 2022-10-04 DIAGNOSIS — R69 Illness, unspecified: Secondary | ICD-10-CM | POA: Diagnosis not present

## 2022-10-04 DIAGNOSIS — F3132 Bipolar disorder, current episode depressed, moderate: Secondary | ICD-10-CM

## 2022-10-04 DIAGNOSIS — G4700 Insomnia, unspecified: Secondary | ICD-10-CM

## 2022-10-04 MED ORDER — SERTRALINE HCL 50 MG PO TABS: 50.0000 mg | ORAL_TABLET | Freq: Every day | ORAL | 0 refills | Status: DC

## 2022-10-04 MED ORDER — ARIPIPRAZOLE 15 MG PO TABS: 15.0000 mg | ORAL_TABLET | Freq: Every day | ORAL | 0 refills | Status: DC

## 2022-10-04 MED ORDER — ESZOPICLONE 2 MG PO TABS: 2.0000 mg | ORAL_TABLET | Freq: Every evening | ORAL | 0 refills | Status: DC | PRN

## 2022-10-04 NOTE — Progress Notes (Unsigned)
I,Jenia Klepper S Kymorah Korf,acting as a Education administrator for Lavon Paganini, MD.,have documented all relevant documentation on the behalf of Lavon Paganini, MD,as directed by  Lavon Paganini, MD while in the presence of Lavon Paganini, MD.     Established patient visit   Patient: Troy [name]   DOB: [birth year]   51 y.o. Male  MRN: [id number] Visit Date: 10/06/2022  Today's healthcare provider: Lavon Paganini, MD   No chief complaint on file.  Subjective    HPI  Follow up for asthma  The patient was last seen for this 3 months ago and 09/26/22. Changes made at last visit include refill albuterol ans start trial of Qvar. 09/26/22 start prednisone.  He reports {excellent/good/fair/poor:19665} compliance with treatment. He feels that condition is {improved/worse/unchanged:3041574}. He {is/is not:[id number]} having side effects. ***  -----------------------------------------------------------------------------------------   Medications: Outpatient Medications Prior to Visit  Medication Sig   albuterol (VENTOLIN HFA) 108 (90 Base) MCG/ACT inhaler Inhale 2 puffs into the lungs every 6 (six) hours as needed for wheezing or shortness of breath.   amoxicillin-clavulanate (AUGMENTIN) 875-125 MG tablet Take 1 tablet by mouth 2 (two) times daily.   [START ON 11/01/2022] ARIPiprazole (ABILIFY) 15 MG tablet Take 1 tablet (15 mg total) by mouth daily.   atorvastatin (LIPITOR) 20 MG tablet Take 1 tablet (20 mg total) by mouth daily.   beclomethasone (QVAR) 80 MCG/ACT inhaler Inhale 1 puff into the lungs 2 (two) times daily.   bictegravir-emtricitabine-tenofovir AF (BIKTARVY) 50-200-25 MG TABS tablet Take 1 tablet by mouth daily.   Budeson-Glycopyrrol-Formoterol (BREZTRI AEROSPHERE) 160-9-4.8 MCG/ACT AERO Inhale 2 puffs into the lungs 2 (two) times daily.   cetirizine (ZYRTEC) 10 MG tablet Take 1 tablet (10 mg total) by mouth daily.   chlorpheniramine-HYDROcodone (TUSSIONEX) 10-8 MG/5ML Take 5 mLs by  mouth every 12 (twelve) hours as needed for cough.   eszopiclone (LUNESTA) 2 MG TABS tablet Take 1 tablet (2 mg total) by mouth at bedtime as needed for sleep. Take immediately before bedtime   [START ON 11/03/2022] eszopiclone (LUNESTA) 2 MG TABS tablet Take 1 tablet (2 mg total) by mouth at bedtime as needed for sleep. Take immediately before bedtime   montelukast (SINGULAIR) 10 MG tablet Take 1 tablet (10 mg total) by mouth daily.   Na Sulfate-K Sulfate-Mg Sulf 17.5-3.13-1.6 GM/177ML SOLN SMARTSIG:354 Milliliter(s) By Mouth Once   omeprazole (PRILOSEC) 40 MG capsule TAKE 1 CAPSULE BY MOUTH EVERY DAY BEFORE BREAKFAST   ondansetron (ZOFRAN ODT) 4 MG disintegrating tablet Take 1 tablet (4 mg total) by mouth every 8 (eight) hours as needed.   sertraline (ZOLOFT) 50 MG tablet Take 1 tablet (50 mg total) by mouth at bedtime.   SUMAtriptan (IMITREX) 100 MG tablet TAKE ONE TABLET BY MOUTH AT ONSET OF THE HEADACHE. MAY REPEAT IN TWO HOURS   tiZANidine (ZANAFLEX) 4 MG tablet TAKE 1 CAPSULE (4 MG TOTAL) BY MOUTH 2 (TWO) TIMES DAILY.   topiramate (TOPAMAX) 100 MG tablet Take 1 tablet (100 mg total) by mouth 2 (two) times daily.   No facility-administered medications prior to visit.    Review of Systems  {Labs  Heme  Chem  Endocrine  Serology  Results Review (optional):23779}   Objective    There were no vitals taken for this visit. BP Readings from Last 3 Encounters:  09/29/22 120/77  09/26/22 112/67  08/22/22 117/72   Wt Readings from Last 3 Encounters:  09/29/22 157 lb (71.2 kg)  09/26/22 161 lb 14.4 oz (73.4 kg)  08/22/22 166 lb (75.3  kg)      Physical Exam  ***  No results found for any visits on 10/06/22.  Assessment & Plan     ***  No follow-ups on file.      {provider attestation***:1}   Lavon Paganini, MD  [facility] [phone] (phone) [phone] (fax)  [place]

## 2022-10-04 NOTE — Patient Instructions (Addendum)
Continue Abilify 15 mg  Obtain EKG Increase sertraline 50 mg at night  Continue Lunesta 2 mg at night as needed for sleep Next appointment: 2/1 at 9:3

## 2022-10-05 ENCOUNTER — Telehealth: Payer: Self-pay

## 2022-10-05 NOTE — Progress Notes (Signed)
   Care Guide Note  10/05/2022 Name: Troy [name] MRN: [id number] DOB: September 14, 1971  Referred by: Virginia Crews, MD Reason for referral : Care Coordination (Outreach to schedule with Pharm D )   [name] is a 51 y.o. year old male who is a primary care patient of Brita Romp, Dionne Bucy, MD. Kentravious Lipford was referred to the pharmacist for assistance related to DM.    Successful contact was made with the patient to discuss pharmacy services including being ready for the pharmacist to call at least 5 minutes before the scheduled appointment time, to have medication bottles and any blood sugar or blood pressure readings ready for review. The patient agreed to meet with the pharmacist via with the pharmacist via telephone visit on (date/time).  10/14/2022  Noreene Larsson, [place], [place] [address] Direct Dial: [phone] [email or web address]

## 2022-10-06 ENCOUNTER — Ambulatory Visit (INDEPENDENT_AMBULATORY_CARE_PROVIDER_SITE_OTHER): Payer: 59 | Admitting: Family Medicine

## 2022-10-06 ENCOUNTER — Encounter: Payer: Self-pay | Admitting: Family Medicine

## 2022-10-06 VITALS — BP 116/78 | HR 71 | Temp 97.8°F | Resp 16 | Wt 152.0 lb

## 2022-10-06 DIAGNOSIS — Z599 Problem related to housing and economic circumstances, unspecified: Secondary | ICD-10-CM | POA: Diagnosis not present

## 2022-10-06 DIAGNOSIS — J4520 Mild intermittent asthma, uncomplicated: Secondary | ICD-10-CM

## 2022-10-06 DIAGNOSIS — H6991 Unspecified Eustachian tube disorder, right ear: Secondary | ICD-10-CM

## 2022-10-06 MED ORDER — FLUTICASONE PROPIONATE 50 MCG/ACT NA SUSP: 2.0000 | Freq: Every day | NASAL | 6 refills | Status: DC

## 2022-10-06 NOTE — Assessment & Plan Note (Signed)
Has worked with SW, but was told there are no resources May be staying at an AirBnB Upcoming pharmD appt for med assistance

## 2022-10-06 NOTE — Assessment & Plan Note (Signed)
Well controlled without exacerbation currently No need for additional prednisone Does need controller inhaler Has appt with pharmD for med assistance He got samples of breztry but will be put on QVAR He knows not to take this together

## 2022-10-06 NOTE — Assessment & Plan Note (Signed)
Patient was seen last week and there was report of TM rupture There is no rupture - ear drum intact and no infection He is experiencing ETD related to sinusitis Finish abx for sinusitis Continue flonase Use decongestant

## 2022-10-14 ENCOUNTER — Encounter: Payer: Self-pay | Admitting: Pharmacist

## 2022-10-14 ENCOUNTER — Other Ambulatory Visit: Payer: 59 | Admitting: Pharmacist

## 2022-10-14 NOTE — Patient Instructions (Signed)
Goals Addressed             This Visit's Progress    Pharmacy Goals       Please follow up with your [facility]. When requesting your refills, please ask the pharmacy to process these through your [place] Medicaid coverage. Please let me know if you have any questions.  Thank you!   Wallace Cullens, PharmD, [facility] [phone]

## 2022-10-14 NOTE — Chronic Care Management (AMB) (Signed)
10/14/2022 Name: Troy [name] MRN: [id number] DOB: 1971/07/29  Chief Complaint  Patient presents with   Medication Assistance    Troy [name] is a 51 y.o. year old male who presented for a telephone visit.   They were referred to the pharmacist by their Case Management Team  for assistance in managing medication access.   Patient is participating in a Managed Medicaid Plan:  Yes - Today find that patient has [place] Medicaid plan  Subjective:  Care Team: Primary Care Provider: Virginia Crews, MD  Psychiatrist: Norman Clay, MD; Next Scheduled Visit: 11/24/2022 GI Specialist: Lin Landsman, MD Infectious Disease Specialist: Tommy Medal, Lavell Islam, MD; Next Scheduled Visit: 12/19/2022 [facility] Social Worker: Daneen Schick, BSW, CDP  Medication Access/Adherence  [id number] [place]Phone: [phone]: [phone] [facility] [id number] [place] [address]Phone: [phone]: [phone]  Patient reports affordability concerns with their medications: Yes  Patient reports access/transportation concerns to their pharmacy: No  Patient reports adherence concerns with their medications:  Yes  related to affordability  Today patient reports he continues to feel like he is losing weight, attributes to sometimes throwing up a couple of times/week Note patient has discussed this ongoing concern with PCP and [facility] From review of chart, note at last appointment with Dr. VMarius Ditchon 7/26, provider advised patient:  Recommend upper endoscopy and colonoscopy for further evaluation Check pancreatic fecal elastase levels and fecal calprotectin levels If above work-up is unremarkable,  recommend CT abdomen and pelvis with contrast Restart omeprazole 40 mg once a day before breakfast Note patient scheduled for colonoscopy for 8/10, but called to cancel this appointment Patient reports that he [place] has new insurance coverage Denies taking omeprazole 40 mg recently due to cost.  Find cost of omeprazole affordable to patient through [facility], but [facility] (see below regarding cost through his insurance coverage) Update: [place]. Cost is $4 for omeprazole 90 day supply Rx Encourage patient to pick up and restart taking omeprazole as directed by GI specialist  Encourage patient to follow up with GI specialist when able Patient reports will call PCP office today to discuss this concerns and also plans to request refill of ondansetron Rx  Medication Assistance:  Today patient reports difficulty with affording his medication through his [employer] plan. Reports his current insurance coverage is an [employer], molecular Reports plans to have a new [place] plan starting in February, but reports medication copayments for all of his generic medications are $40 and brand $100 per month supply until February.  Reports receives his Biktarvy through assistance program from ID clinic  Discuss with patient potential cost savings options if copayments temporarily unaffordable through his current prescription plan. However, from review of chart note patient profile showing notation about Managed Medicaid coverage - [facility] on behalf of patient. Request pharmacy run a search for active Medicaid coverage for patient - [facility]. Request Pharmacy Technician run patient's Rx for omeprazole through Medicaid coverage Attempt to reach patient back by telephone  to let him know about his  active [facility] coverage. Leave HIPAA compliant voicemail asking patient to return my call.  Will also send patient a MyChart message to provide this update    Objective: Lab Results  Component Value Date   HGBA1C 5.1 05/21/2020    Lab Results  Component Value Date   CREATININE 1.05 07/14/2022   BUN 10 07/14/2022   NA 140 07/14/2022   K 3.9 07/14/2022   CL 107 07/14/2022   CO2 23 07/14/2022    Lab Results  Component Value Date   CHOL 176 12/10/2021   HDL 54 12/10/2021   LDLCALC 104 (H) 12/10/2021   TRIG 86 12/10/2021   CHOLHDL 3.3 12/10/2021   BP Readings from Last 3 Encounters:  10/06/22 116/78  10/04/22 114/76  09/29/22 120/77     Medications Reviewed Today     Reviewed by Rennis Petty, RPH-CPP (Pharmacist) on 10/14/22 at 1024  Med List Status: <None>   Medication Order Taking? Sig Documenting Provider Last Dose Status Informant  albuterol (VENTOLIN HFA) 108 (90 Base) MCG/ACT inhaler [id number] Yes Inhale 2 puffs into the lungs every 6 (six) hours as needed for wheezing or shortness of breath. Myles Gip, DO Taking Active   ARIPiprazole (ABILIFY) 15 MG tablet [id number] Yes Take 1 tablet (15 mg total) by mouth daily. Norman Clay, MD Taking Active   atorvastatin (LIPITOR) 20 MG tablet [id number] Yes Take 1 tablet (20 mg total) by mouth daily. Tommy Medal, Lavell Islam, MD Taking Active   beclomethasone (QVAR) 80 MCG/ACT inhaler [id number] No Inhale 1 puff into the lungs 2 (two) times daily.  Patient not taking: Reported on 10/14/2022   Myles Gip, DO Not Taking Active   bictegravir-emtricitabine-tenofovir AF (BIKTARVY) 50-200-25 MG TABS tablet [id number] Yes Take 1 tablet by mouth daily. Tommy Medal, Lavell Islam, MD Taking Active   Budeson-Glycopyrrol-Formoterol (BREZTRI AEROSPHERE) 160-9-4.8 MCG/ACT Hollie Salk [id number] Yes Inhale 2 puffs into the lungs 2 (two) times daily. Gwyneth Sprout, FNP Taking Active   cetirizine (ZYRTEC) 10 MG tablet  [id number] Yes Take 1 tablet (10 mg total) by mouth daily. Virginia Crews, MD Taking Active   eszopiclone Johnnye Sima) 2 MG TABS tablet [id number] Yes Take 1 tablet (2 mg total) by mouth at bedtime as needed for sleep. Take immediately before bedtime Hisada, Elie Goody, MD Taking Active   fluticasone (FLONASE) 50 MCG/ACT nasal spray [id number] Yes Place 2 sprays into both nostrils daily. Virginia Crews, MD Taking Active   montelukast (SINGULAIR) 10 MG tablet [id number] Yes Take 1 tablet (10 mg total) by mouth daily. Virginia Crews, MD Taking Active   omeprazole (PRILOSEC) 40 MG capsule [id number]  TAKE 1 CAPSULE BY MOUTH EVERY DAY BEFORE BREAKFAST Vanga, Tally Due, MD  Active   ondansetron (ZOFRAN ODT) 4 MG disintegrating tablet [id number] No Take 1 tablet (4 mg total) by mouth every 8 (eight) hours as needed.  Patient not taking: Reported on 10/14/2022   Virginia Crews, MD Not Taking Active   sertraline (ZOLOFT) 50 MG tablet [id number] Yes Take 1 tablet (50 mg total) by mouth at bedtime. Norman Clay, MD Taking Active   SUMAtriptan (IMITREX) 100 MG tablet [id number] Yes TAKE ONE TABLET BY MOUTH AT ONSET OF THE HEADACHE. MAY REPEAT IN TWO HOURS Bacigalupo, Dionne Bucy, MD Taking Active   tiZANidine (ZANAFLEX) 4 MG tablet [id number] Yes TAKE 1 CAPSULE (4 MG TOTAL) BY MOUTH 2 (TWO) TIMES DAILY. Virginia Crews, MD Taking Active  topiramate (TOPAMAX) 100 MG tablet [id number] Yes Take 1 tablet (100 mg total) by mouth 2 (two) times daily. Virginia Crews, MD Taking Active               Assessment/Plan:   Encourage patient to pick up and restart taking omeprazole as directed by GI specialist  Encourage patient to follow up with GI specialist Patient reports will call PCP office today to discuss concerns and also plans to request refill of ondansetron Rx Will send message to PCP  Medication Assistance: - Discuss with patient potential cost savings options if copayments  temporarily unaffordable through his current prescription plan. However, from review of chart note patient profile showing notation about Managed Medicaid coverage - [facility] on behalf of [facility]. Request pharmacy run a search for active Medicaid coverage for patient - active coverage through [facility] found. Request Pharmacy Technician run patient's Rx for omeprazole through Medicaid coverage, copayment for patient is $4 for 90 day supply Attempt to reach patient back by telephone to let him know about his [place]. Leave HIPAA compliant voicemail asking patient to return my call - Send patient a MyChart message to let him know - [facility] a note on patient's prescription bag to let him know  Follow Up Plan: Clinical Pharmacist will attempt to reach patient by telephone again within the next 7 days  Wallace Cullens, PharmD, [place] [phone]

## 2022-10-15 DIAGNOSIS — J45909 Unspecified asthma, uncomplicated: Secondary | ICD-10-CM | POA: Diagnosis not present

## 2022-10-15 DIAGNOSIS — Z8249 Family history of ischemic heart disease and other diseases of the circulatory system: Secondary | ICD-10-CM | POA: Diagnosis not present

## 2022-10-15 DIAGNOSIS — Z888 Allergy status to other drugs, medicaments and biological substances status: Secondary | ICD-10-CM | POA: Diagnosis not present

## 2022-10-15 DIAGNOSIS — G43909 Migraine, unspecified, not intractable, without status migrainosus: Secondary | ICD-10-CM | POA: Diagnosis not present

## 2022-10-15 DIAGNOSIS — Z833 Family history of diabetes mellitus: Secondary | ICD-10-CM | POA: Diagnosis not present

## 2022-10-15 DIAGNOSIS — I252 Old myocardial infarction: Secondary | ICD-10-CM | POA: Diagnosis not present

## 2022-10-15 DIAGNOSIS — G4700 Insomnia, unspecified: Secondary | ICD-10-CM | POA: Diagnosis not present

## 2022-10-15 DIAGNOSIS — R030 Elevated blood-pressure reading, without diagnosis of hypertension: Secondary | ICD-10-CM | POA: Diagnosis not present

## 2022-10-15 DIAGNOSIS — R69 Illness, unspecified: Secondary | ICD-10-CM | POA: Diagnosis not present

## 2022-10-15 DIAGNOSIS — G8929 Other chronic pain: Secondary | ICD-10-CM | POA: Diagnosis not present

## 2022-10-18 ENCOUNTER — Other Ambulatory Visit: Payer: Self-pay | Admitting: Family Medicine

## 2022-10-18 MED ORDER — ONDANSETRON 4 MG PO TBDP: 4.0000 mg | ORAL_TABLET | Freq: Three times a day (TID) | ORAL | 0 refills | Status: DC | PRN

## 2022-10-19 ENCOUNTER — Other Ambulatory Visit: Payer: 59 | Admitting: Pharmacist

## 2022-10-19 NOTE — Chronic Care Management (AMB) (Signed)
   10/19/2022 Name: [name] MRN: [id number] DOB: 21-Sep-1971  Chief Complaint  Patient presents with   Medication Access    Troy [name] is a 51 y.o. year old male who presented for a telephone visit.  Speak briefly today as patient is currently on a break at work   They were referred to the pharmacist by their PCP for assistance in managing medication access.   Patient is participating in a Managed Medicaid Plan:  Yes  Subjective:  Care Team: Primary Care Provider: Virginia Crews, MD  Psychiatrist: Norman Clay, MD; Next Scheduled Visit: 11/24/2022 GI Specialist: Lin Landsman, MD Infectious Disease Specialist: Tommy Medal, Lavell Islam, MD; Next Scheduled Visit: 12/19/2022 [facility] Social Worker: Daneen Schick, BSW, CDP  Medication Access/Adherence  [facility]  [id number] [place]Phone: [phone]: [phone] [facility] [address]Phone: [phone]: [phone]  Today follow up with patient by telephone to let him know about [place]. Reach patient by telephone who verbalizes understanding.  - Agree to follow up by telephone again as previously planned - Patient to follow up with his pharmacy as needed for medication refills - Patient to contact office/Clinical Pharmacist sooner if needed related to medication questions/concerns   Follow Up Plan: Clinical Pharmacist will follow up with patient by telephone on 11/16/2022 at 2:30 pm  EWallace Cullens PharmD, [facility] [phone]

## 2022-10-19 NOTE — Patient Instructions (Signed)
Goals Addressed             This Visit's Progress    Pharmacy Goals       [facility] When requesting your refills, please ask the pharmacy to process these through your [place] Medicaid coverage. Please let me know if you have any questions.  Thank you!    Wallace Cullens, PharmD, [facility] [phone]

## 2022-10-24 DIAGNOSIS — Z419 Encounter for procedure for purposes other than remedying health state, unspecified: Secondary | ICD-10-CM | POA: Diagnosis not present

## 2022-10-26 ENCOUNTER — Telehealth (INDEPENDENT_AMBULATORY_CARE_PROVIDER_SITE_OTHER): Payer: 59 | Admitting: Gastroenterology

## 2022-10-26 ENCOUNTER — Ambulatory Visit: Payer: 59 | Admitting: Gastroenterology

## 2022-10-26 ENCOUNTER — Encounter: Payer: Self-pay | Admitting: Gastroenterology

## 2022-10-26 DIAGNOSIS — R634 Abnormal weight loss: Secondary | ICD-10-CM | POA: Diagnosis not present

## 2022-10-26 DIAGNOSIS — R112 Nausea with vomiting, unspecified: Secondary | ICD-10-CM

## 2022-10-26 NOTE — Progress Notes (Signed)
Troy Lame, MD [address]  [place]  [place], [place] [address]  Main: [phone]  Fax: [phone]    Gastroenterology Virtual/Video Visit  Referring Provider:     Virginia Crews, MD Primary Care Physician:  Virginia Crews, MD Primary Gastroenterologist:  Dr.Ashwin Tibbs Allen Norris Reason for Consultation:     Nausea, Vomiting and weight loss.        HPI:    Virtual Visit via Video Note Location of the patient: Home Location of provider: Home  Participating persons: The patient and myself.  I connected with [name] on 10/26/22 at 10:45 AM EST by a video enabled telemedicine application and verified that I am speaking with the correct person using two identifiers.   I discussed the limitations of evaluation and management by telemedicine and the availability of in person appointments. The patient expressed understanding and agreed to proceed.  Verbal consent to proceed obtained.  History of Present Illness: Shalon Councilman is a 52 y.o. male referred by Dr. Brita Romp, Dionne Bucy, MD  for consultation & management of Nausea vomiting weight loss.  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  He states that his last bout was approximately 6 months ago and at that time was recommended to have an upper endoscopy but his insurance would not pay for it.  The patient has changes insurance and is feeling better this week after being given antiemetics by his PCP.  The patient is now ready to set up the upper endoscopy due to his recurrent nausea and vomiting.  The patient also reports that he has lost close to 30 pounds.  He denies any black stools or bloody stools.  He also denies any hematemesis.  Past Medical History:  Diagnosis Date   Asthma    Bipolar 1 disorder ([place])    Diabetes mellitus without complication ([place])    History of CVA with residual deficit    HIV infection ([place])    Hyperlipidemia 08/22/2022    Nephrolithiasis     Past Surgical History:  Procedure Laterality Date   COLONOSCOPY     COLONOSCOPY WITH PROPOFOL N/A 05/30/2019   Procedure: COLONOSCOPY WITH PROPOFOL;  Surgeon: Lin Landsman, MD;  Location: [facility] ENDOSCOPY;  Service: Gastroenterology;  Laterality: N/A;   ELBOW SURGERY Left    fracture   ESOPHAGOGASTRODUODENOSCOPY (EGD) WITH PROPOFOL N/A 05/30/2019   Procedure: ESOPHAGOGASTRODUODENOSCOPY (EGD) WITH PROPOFOL;  Surgeon: Lin Landsman, MD;  Location: [facility];  Service: Gastroenterology;  Laterality: N/A;   ESOPHAGOGASTRODUODENOSCOPY (EGD) WITH PROPOFOL N/A 09/12/2019   Procedure: ESOPHAGOGASTRODUODENOSCOPY (EGD) WITH PROPOFOL;  Surgeon: Lin Landsman, MD;  Location: [facility] ENDOSCOPY;  Service: Gastroenterology;  Laterality: N/A;   FACIAL COSMETIC SURGERY     GASTRIC BYPASS     HERNIA REPAIR     KIDNEY STONE SURGERY     renal artery repair      Prior to Admission medications   Medication Sig Start Date End Date Taking? Authorizing Provider  albuterol (VENTOLIN HFA) 108 (90 Base) MCG/ACT inhaler Inhale 2 puffs into the lungs every 6 (six) hours as needed for wheezing or shortness of breath. 07/20/22   Myles Gip, DO  ARIPiprazole (ABILIFY) 15 MG tablet Take 1 tablet (15 mg total) by mouth daily. 11/01/22 01/30/23  Norman Clay, MD  atorvastatin (LIPITOR) 20 MG tablet Take 1 tablet (20 mg total) by mouth daily. 08/22/22   Truman Hayward, MD  beclomethasone (  QVAR) 80 MCG/ACT inhaler Inhale 1 puff into the lungs 2 (two) times daily. Patient not taking: Reported on 10/14/2022 07/20/22   Myles Gip, DO  bictegravir-emtricitabine-tenofovir AF (BIKTARVY) 50-200-25 MG TABS tablet Take 1 tablet by mouth daily. 08/22/22   Truman Hayward, MD  Budeson-Glycopyrrol-Formoterol (BREZTRI AEROSPHERE) 160-9-4.8 MCG/ACT AERO Inhale 2 puffs into the lungs 2 (two) times daily. 09/29/22   Gwyneth Sprout, FNP  cetirizine (ZYRTEC) 10 MG tablet Take 1 tablet (10  mg total) by mouth daily. 02/03/22   Bacigalupo, Dionne Bucy, MD  eszopiclone (LUNESTA) 2 MG TABS tablet Take 1 tablet (2 mg total) by mouth at bedtime as needed for sleep. Take immediately before bedtime 11/03/22 12/03/22  Norman Clay, MD  fluticasone (FLONASE) 50 MCG/ACT nasal spray Place 2 sprays into both nostrils daily. 10/06/22   Virginia Crews, MD  montelukast (SINGULAIR) 10 MG tablet Take 1 tablet (10 mg total) by mouth daily. 02/03/22   Virginia Crews, MD  omeprazole (PRILOSEC) 40 MG capsule TAKE 1 CAPSULE BY MOUTH EVERY DAY BEFORE BREAKFAST 08/29/22   Vanga, Tally Due, MD  ondansetron (ZOFRAN ODT) 4 MG disintegrating tablet Take 1 tablet (4 mg total) by mouth every 8 (eight) hours as needed. 10/18/22   Simmons-Robinson, Riki Sheer, MD  sertraline (ZOLOFT) 50 MG tablet Take 1 tablet (50 mg total) by mouth at bedtime. 10/04/22 01/02/23  Norman Clay, MD  SUMAtriptan (IMITREX) 100 MG tablet TAKE ONE TABLET BY MOUTH AT ONSET OF THE HEADACHE. MAY REPEAT IN TWO HOURS 02/03/22   Virginia Crews, MD  tiZANidine (ZANAFLEX) 4 MG tablet TAKE 1 CAPSULE (4 MG TOTAL) BY MOUTH 2 (TWO) TIMES DAILY. 08/22/22   Virginia Crews, MD  topiramate (TOPAMAX) 100 MG tablet Take 1 tablet (100 mg total) by mouth 2 (two) times daily. 02/03/22   Bacigalupo, Dionne Bucy, MD    Family History  Problem Relation Age of Onset   Bipolar disorder Mother    COPD Mother    Diabetes Mother    Hypertension Mother    Hyperlipidemia Mother    Healthy Father    Cancer Father        skin   Stroke Brother    Heart disease Brother    Seizures Brother    Thyroid disease Brother    Diabetes Maternal Grandfather    Hyperlipidemia Maternal Grandfather    Cancer Maternal Grandmother        breast   Hyperlipidemia Maternal Grandmother    Hypertension Maternal Grandmother    Heart disease Paternal Grandfather    Hypertension Paternal Grandfather      Social History   Tobacco Use   Smoking status: Every Day     Packs/day: 1.00    Years: 25.00    Total pack years: 25.00    Types: E-cigarettes, Cigarettes    Last attempt to quit: 2013    Years since quitting: 11.0   Smokeless tobacco: Never  Vaping Use   Vaping Use: Every day  Substance Use Topics   Alcohol use: Not Currently    Comment: 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   Observations/Objective:  Labs: CBC    Component Value Date/Time   WBC 5.3 07/14/2022 1042   RBC 4.38 07/14/2022 1042   HGB 13.9 07/14/2022 1042   HGB 15.3 08/14/2019 1144   HCT 39.9 07/14/2022 1042   HCT 44.9 08/14/2019 1144   PLT 234 07/14/2022 1042   PLT 197 08/14/2019 1144   MCV 91.1 07/14/2022 1042   MCV 91 08/14/2019 1144   MCH 31.7 07/14/2022 1042   MCHC 34.8 07/14/2022 1042   RDW 12.6 07/14/2022 1042   RDW 12.9 08/14/2019 1144   LYMPHSABS 1,526 07/14/2022 1042   LYMPHSABS 1.2 08/14/2019 1144   EOSABS 32 07/14/2022 1042   EOSABS 0.0 08/14/2019 1144   BASOSABS 21 07/14/2022 1042   BASOSABS 0.0 08/14/2019 1144   CMP     Component Value Date/Time   NA 140 07/14/2022 1042   NA 143 12/10/2019 0846   K 3.9 07/14/2022 1042   CL 107 07/14/2022 1042   CO2 23 07/14/2022 1042   GLUCOSE 78 07/14/2022 1042   BUN 10 07/14/2022 1042   BUN 10 12/10/2019 0846   CREATININE 1.05 07/14/2022 1042   CALCIUM 9.2 07/14/2022 1042   PROT 6.7 07/14/2022 1042   PROT 6.2 12/10/2019 0846   ALBUMIN 4.5 01/22/2020 1111   ALBUMIN 4.2 12/10/2019 0846   AST 24 07/14/2022 1042   ALT 25 07/14/2022 1042   ALKPHOS 44 01/22/2020 1111   BILITOT 0.5 07/14/2022 1042   BILITOT 0.5 12/10/2019 0846   GFRNONAA >60 01/22/2020 1111   GFRAA  >60 01/22/2020 1111    Imaging Studies: No results found.  Assessment and Plan:   Mikeal Winstanley is a 52 y.o. y/o male has been referred for Nausea and vomiting with weight loss.  The patient has a history of HIV with a history of asthma.  The patient will be set up for an EGD to look for any structural cause of his Nausea vomiting weight loss.  The patient has been explained the plan and agrees with it.  The patient will be set up for a upper endoscopy at the next appointment.  The patient has been but the plan and agrees with it.  Follow Up Instructions:  I discussed the assessment and treatment plan with the patient. The patient was provided an opportunity to ask questions and all were answered. The patient agreed with the plan and demonstrated an understanding of the instructions.   The patient was advised to call back or seek an in-person evaluation if the symptoms worsen or if the condition fails to improve as anticipated.  I provided 25 minutes of non-face-to-face time during this encounter.   Troy Lame, MD  Speech recognition software was used to dictate the above note.

## 2022-10-26 NOTE — Addendum Note (Signed)
Addended by: Lurlean Nanny on: 10/26/2022 05:02 PM   Modules accepted: Orders

## 2022-10-27 ENCOUNTER — Encounter: Payer: Self-pay | Admitting: Anesthesiology

## 2022-10-27 ENCOUNTER — Encounter: Payer: Self-pay | Admitting: Gastroenterology

## 2022-10-28 ENCOUNTER — Ambulatory Visit: Admission: RE | Admit: 2022-10-28 | Payer: 59 | Source: Home / Self Care | Admitting: Gastroenterology

## 2022-10-28 HISTORY — DX: Migraine, unspecified, not intractable, without status migrainosus: G43.909

## 2022-10-28 HISTORY — DX: Presence of dental prosthetic device (complete) (partial): Z97.2

## 2022-10-28 HISTORY — DX: Gastro-esophageal reflux disease without esophagitis: K21.9

## 2022-10-28 SURGERY — ESOPHAGOGASTRODUODENOSCOPY (EGD) WITH PROPOFOL
Anesthesia: Choice

## 2022-11-03 ENCOUNTER — Other Ambulatory Visit: Payer: Self-pay

## 2022-11-03 DIAGNOSIS — R112 Nausea with vomiting, unspecified: Secondary | ICD-10-CM

## 2022-11-08 ENCOUNTER — Ambulatory Visit: Payer: 59 | Admitting: Infectious Disease

## 2022-11-16 ENCOUNTER — Telehealth: Payer: Self-pay | Admitting: Pharmacist

## 2022-11-16 ENCOUNTER — Other Ambulatory Visit: Payer: 59 | Admitting: Pharmacist

## 2022-11-16 NOTE — Progress Notes (Signed)
   Outreach Note  11/16/2022 Name: Troy [name] MRN: [id number] DOB: [birth year]  Referred by: Virginia Crews, MD Reason for referral : No chief complaint on file.   Was unable to reach patient via telephone today and have left HIPAA compliant voicemail asking patient to return my call.    Follow Up Plan: Will collaborate with Care Guide to outreach to schedule follow up with me  Wallace Cullens, PharmD, [place] [phone]
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
## 2022-11-23 ENCOUNTER — Ambulatory Visit: Payer: Medicaid Other | Admitting: Anesthesiology

## 2022-11-23 ENCOUNTER — Ambulatory Visit
Admission: RE | Admit: 2022-11-23 | Discharge: 2022-11-23 | Disposition: A | Discharge status: Home / Self Care | Payer: Medicaid Other | Attending: Gastroenterology | Admitting: Gastroenterology

## 2022-11-23 ENCOUNTER — Encounter
Admission: RE | Disposition: A | Discharge status: Home / Self Care | Payer: Self-pay | Source: Home / Self Care | Attending: Gastroenterology

## 2022-11-23 DIAGNOSIS — F319 Bipolar disorder, unspecified: Secondary | ICD-10-CM | POA: Diagnosis not present

## 2022-11-23 DIAGNOSIS — Z21 Asymptomatic human immunodeficiency virus [HIV] infection status: Secondary | ICD-10-CM | POA: Diagnosis not present

## 2022-11-23 DIAGNOSIS — R69 Illness, unspecified: Secondary | ICD-10-CM | POA: Diagnosis not present

## 2022-11-23 DIAGNOSIS — R112 Nausea with vomiting, unspecified: Secondary | ICD-10-CM | POA: Diagnosis not present

## 2022-11-23 DIAGNOSIS — J45909 Unspecified asthma, uncomplicated: Secondary | ICD-10-CM | POA: Diagnosis not present

## 2022-11-23 DIAGNOSIS — K449 Diaphragmatic hernia without obstruction or gangrene: Secondary | ICD-10-CM

## 2022-11-23 DIAGNOSIS — Z9884 Bariatric surgery status: Secondary | ICD-10-CM | POA: Diagnosis not present

## 2022-11-23 DIAGNOSIS — E119 Type 2 diabetes mellitus without complications: Secondary | ICD-10-CM | POA: Diagnosis not present

## 2022-11-23 DIAGNOSIS — F17200 Nicotine dependence, unspecified, uncomplicated: Secondary | ICD-10-CM | POA: Diagnosis not present

## 2022-11-23 DIAGNOSIS — F17210 Nicotine dependence, cigarettes, uncomplicated: Secondary | ICD-10-CM | POA: Insufficient documentation

## 2022-11-23 DIAGNOSIS — K219 Gastro-esophageal reflux disease without esophagitis: Secondary | ICD-10-CM | POA: Insufficient documentation

## 2022-11-23 DIAGNOSIS — Z8673 Personal history of transient ischemic attack (TIA), and cerebral infarction without residual deficits: Secondary | ICD-10-CM | POA: Insufficient documentation

## 2022-11-23 HISTORY — PX: ESOPHAGOGASTRODUODENOSCOPY (EGD) WITH PROPOFOL: SHX5813

## 2022-11-23 SURGERY — ESOPHAGOGASTRODUODENOSCOPY (EGD) WITH PROPOFOL
Anesthesia: General

## 2022-11-23 MED ORDER — LIDOCAINE HCL (CARDIAC) PF 100 MG/5ML IV SOSY
PREFILLED_SYRINGE | INTRAVENOUS | Status: DC | PRN
  Administered 2022-11-23: 100 mg via INTRAVENOUS

## 2022-11-23 MED ORDER — EPHEDRINE SULFATE (PRESSORS) 50 MG/ML IJ SOLN
INTRAMUSCULAR | Status: DC | PRN
  Administered 2022-11-23: 10 mg via INTRAVENOUS

## 2022-11-23 MED ORDER — PROPOFOL 10 MG/ML IV BOLUS
INTRAVENOUS | Status: DC | PRN
  Administered 2022-11-23 (×6): 20 mg via INTRAVENOUS
  Administered 2022-11-23: 100 mg via INTRAVENOUS

## 2022-11-23 MED ORDER — GLYCOPYRROLATE 0.2 MG/ML IJ SOLN
INTRAMUSCULAR | Status: DC | PRN
  Administered 2022-11-23: .2 mg via INTRAVENOUS

## 2022-11-23 MED ORDER — LIDOCAINE HCL (PF) 2 % IJ SOLN
INTRAMUSCULAR | Status: AC
  Filled 2022-11-23: qty 5

## 2022-11-23 MED ORDER — SODIUM CHLORIDE 0.9 % IV SOLN: INTRAVENOUS | Status: DC

## 2022-11-23 MED ORDER — PROPOFOL 10 MG/ML IV BOLUS
INTRAVENOUS | Status: AC
  Filled 2022-11-23: qty 40

## 2022-11-23 NOTE — Anesthesia Postprocedure Evaluation (Signed)
Anesthesia Post Note  Patient: Troy [name]  Procedure(s) Performed: ESOPHAGOGASTRODUODENOSCOPY (EGD) WITH PROPOFOL  Patient location during evaluation: PACU Anesthesia Type: General Level of consciousness: awake and alert Pain management: pain level controlled Vital Signs Assessment: post-procedure vital signs reviewed and stable Respiratory status: spontaneous breathing, nonlabored ventilation and respiratory function stable Cardiovascular status: blood pressure returned to baseline and stable Postop Assessment: no apparent nausea or vomiting Anesthetic complications: no   No notable events documented.   Last Vitals:  Vitals:   11/23/22 0900 11/23/22 0910  BP: 106/67 121/74  Pulse: 60 64  Resp: 17 20  Temp:    SpO2: 100% 100%    Last Pain:  Vitals:   11/23/22 0753  TempSrc: Temporal  PainSc: 0-No pain                 [place] Ouch

## 2022-11-23 NOTE — Op Note (Signed)
[facility] Gastroenterology Patient Name: Troy [name] Procedure Date: 11/23/2022 8:26 AM MRN: [id number] Account #: [id number] Date of Birth: Nov 25, 1970 Admit Type: Outpatient Age: 52 Room: [facility] ENDO ROOM 4 Gender: Male Note Status: Finalized Instrument Name: Upper Endoscope [id number] Procedure:             Upper GI endoscopy Indications:           Nausea with vomiting, Weight loss Providers:             Lin Landsman MD, MD Medicines:             General Anesthesia Complications:         No immediate complications. Estimated blood loss: None. Procedure:             Pre-Anesthesia Assessment:                        - Prior to the procedure, a History and Physical was                         performed, and patient medications and allergies were                         reviewed. The patient is competent. The risks and                         benefits of the procedure and the sedation options and                         risks were discussed with the patient. All questions                         were answered and informed consent was obtained.                         Patient identification and proposed procedure were                         verified by the physician, the nurse, the                         anesthesiologist, the anesthetist and the technician                         in the pre-procedure area in the procedure room in the                         endoscopy suite. Mental Status Examination: alert and                         oriented. Airway Examination: normal oropharyngeal                         airway and neck mobility. Respiratory Examination:                         clear to auscultation. CV Examination: normal.  Prophylactic Antibiotics: The patient does not require                         prophylactic antibiotics. Prior Anticoagulants: The                         patient has taken no anticoagulant or antiplatelet                          agents. ASA Grade Assessment: II - A patient with mild                         systemic disease. After reviewing the risks and                         benefits, the patient was deemed in satisfactory                         condition to undergo the procedure. The anesthesia                         plan was to use general anesthesia. Immediately prior                         to administration of medications, the patient was                         re-assessed for adequacy to receive sedatives. The                         heart rate, respiratory rate, oxygen saturations,                         blood pressure, adequacy of pulmonary ventilation, and                         response to care were monitored throughout the                         procedure. The physical status of the patient was                         re-assessed after the procedure.                        After obtaining informed consent, the endoscope was                         passed under direct vision. Throughout the procedure,                         the patient's blood pressure, pulse, and oxygen                         saturations were monitored continuously. The Endoscope                         was introduced through the mouth, and advanced to the  second part of duodenum. The upper GI endoscopy was                         accomplished without difficulty. The patient tolerated                         the procedure well. Findings:      The duodenal bulb and second portion of the duodenum were normal.       Biopsies were taken with a cold forceps for histology.      Evidence of a sleeve gastrectomy was found in the gastric body. This was       characterized by healthy appearing mucosa.      The gastric body, incisura and gastric antrum were normal. Biopsies were       taken with a cold forceps for histology.      A medium-sized hiatal hernia was present.      The gastroesophageal  junction and examined esophagus were normal. Impression:            - Normal duodenal bulb and second portion of the                         duodenum. Biopsied.                        - A sleeve gastrectomy was found, characterized by                         healthy appearing mucosa.                        - Normal gastric body, incisura and antrum. Biopsied.                        - Medium-sized hiatal hernia.                        - Normal gastroesophageal junction and esophagus. Recommendation:        - Await pathology results.                        - Discharge patient to home (with escort).                        - Resume previous diet today.                        - Continue present medications.                        - Perform a colonoscopy at the next available                         appointment for colon cancer screening. Procedure Code(s):     --- Professional ---                        [phone], Esophagogastroduodenoscopy, flexible,                         transoral; with biopsy, single or multiple Diagnosis Code(s):     ---  Professional ---                        L93.79, Bariatric surgery status                        K44.9, Diaphragmatic hernia without obstruction or                         gangrene                        R11.2, Nausea with vomiting, unspecified                        R63.4, Abnormal weight loss CPT copyright 2022 American Medical Association. All rights reserved. The codes documented in this report are preliminary and upon coder review may  be revised to meet current compliance requirements. Dr. Ulyess Mort Lin Landsman MD, MD 11/23/2022 8:51:42 AM This report has been signed electronically. Number of Addenda: 0 Note Initiated On: 11/23/2022 8:26 AM Estimated Blood Loss:  Estimated blood loss: none.      [facility]

## 2022-11-23 NOTE — H&P (Signed)
Cephas Darby, MD [address]  [place]  [place], [place] [address]  Main: [phone]  Fax: [phone] Pager: [phone]  Primary Care Physician:  Virginia Crews, MD Primary Gastroenterologist:  Dr. Cephas Darby  Pre-Procedure History & Physical: HPI:  Troy [name] is a 52 y.o. male is here for an endoscopy.   Past Medical History:  Diagnosis Date   Asthma    Bipolar 1 disorder ([place])    Diabetes mellitus without complication ([place])    controlled by weight loss   GERD (gastroesophageal reflux disease)    History of CVA with residual deficit    HIV infection ([place])    Hyperlipidemia 08/22/2022   Migraine headache    2-3 X/month   Nephrolithiasis    Wears dentures    full upper and lower    Past Surgical History:  Procedure Laterality Date   COLONOSCOPY     COLONOSCOPY WITH PROPOFOL N/A 05/30/2019   Procedure: COLONOSCOPY WITH PROPOFOL;  Surgeon: Lin Landsman, MD;  Location: [facility];  Service: Gastroenterology;  Laterality: N/A;   ELBOW SURGERY Left    fracture   ESOPHAGOGASTRODUODENOSCOPY (EGD) WITH PROPOFOL N/A 05/30/2019   Procedure: ESOPHAGOGASTRODUODENOSCOPY (EGD) WITH PROPOFOL;  Surgeon: Lin Landsman, MD;  Location: [facility];  Service: Gastroenterology;  Laterality: N/A;   ESOPHAGOGASTRODUODENOSCOPY (EGD) WITH PROPOFOL N/A 09/12/2019   Procedure: ESOPHAGOGASTRODUODENOSCOPY (EGD) WITH PROPOFOL;  Surgeon: Lin Landsman, MD;  Location: [facility] ENDOSCOPY;  Service: Gastroenterology;  Laterality: N/A;   FACIAL COSMETIC SURGERY     GASTRIC BYPASS     HERNIA REPAIR     KIDNEY STONE SURGERY     renal artery repair      Prior to Admission medications   Medication Sig Start Date End Date Taking? Authorizing Provider  ARIPiprazole (ABILIFY) 15 MG tablet Take 1 tablet (15 mg total) by mouth daily. 11/01/22 01/30/23 Yes Hisada, Elie Goody, MD  atorvastatin (LIPITOR) 20 MG tablet Take 1 tablet (20 mg total) by mouth daily. 08/22/22  Yes  Tommy Medal, Lavell Islam, MD  bictegravir-emtricitabine-tenofovir AF (BIKTARVY) 50-200-25 MG TABS tablet Take 1 tablet by mouth daily. 08/22/22  Yes Tommy Medal, Lavell Islam, MD  cetirizine (ZYRTEC) 10 MG tablet Take 1 tablet (10 mg total) by mouth daily. 02/03/22  Yes Bacigalupo, Dionne Bucy, MD  eszopiclone (LUNESTA) 2 MG TABS tablet Take 1 tablet (2 mg total) by mouth at bedtime as needed for sleep. Take immediately before bedtime 11/03/22 12/03/22 Yes Hisada, Elie Goody, MD  fluticasone (FLONASE) 50 MCG/ACT nasal spray Place 2 sprays into both nostrils daily. 10/06/22  Yes Bacigalupo, Dionne Bucy, MD  montelukast (SINGULAIR) 10 MG tablet Take 1 tablet (10 mg total) by mouth daily. 02/03/22  Yes Bacigalupo, Dionne Bucy, MD  omeprazole (PRILOSEC) 40 MG capsule TAKE 1 CAPSULE BY MOUTH EVERY DAY BEFORE BREAKFAST 08/29/22  Yes Aerianna Losey, Tally Due, MD  sertraline (ZOLOFT) 50 MG tablet Take 1 tablet (50 mg total) by mouth at bedtime. 10/04/22 01/02/23 Yes Hisada, Elie Goody, MD  tiZANidine (ZANAFLEX) 4 MG tablet TAKE 1 CAPSULE (4 MG TOTAL) BY MOUTH 2 (TWO) TIMES DAILY. 08/22/22  Yes Bacigalupo, Dionne Bucy, MD  topiramate (TOPAMAX) 100 MG tablet Take 1 tablet (100 mg total) by mouth 2 (two) times daily. 02/03/22  Yes Bacigalupo, Dionne Bucy, MD  albuterol (VENTOLIN HFA) 108 (90 Base) MCG/ACT inhaler Inhale 2 puffs into the lungs every 6 (six) hours as needed for wheezing or shortness of breath. 07/20/22   Myles Gip, DO  beclomethasone (  QVAR) 80 MCG/ACT inhaler Inhale 1 puff into the lungs 2 (two) times daily. Patient not taking: Reported on 10/14/2022 07/20/22   Myles Gip, DO  Budeson-Glycopyrrol-Formoterol (BREZTRI AEROSPHERE) 160-9-4.8 MCG/ACT AERO Inhale 2 puffs into the lungs 2 (two) times daily. 09/29/22   Gwyneth Sprout, FNP  ondansetron (ZOFRAN ODT) 4 MG disintegrating tablet Take 1 tablet (4 mg total) by mouth every 8 (eight) hours as needed. 10/18/22   Simmons-Robinson, Makiera, MD  SUMAtriptan (IMITREX) 100 MG tablet  TAKE ONE TABLET BY MOUTH AT ONSET OF THE HEADACHE. MAY REPEAT IN TWO HOURS 02/03/22   Virginia Crews, MD    Allergies as of 11/03/2022 - Review Complete 10/27/2022  Allergen Reaction Noted   Niacin Anaphylaxis 08/10/2011   Orange oil Anaphylaxis 06/29/2016   Zolpidem  12/16/2015    Family History  Problem Relation Age of Onset   Bipolar disorder Mother    COPD Mother    Diabetes Mother    Hypertension Mother    Hyperlipidemia Mother    Healthy Father    Cancer Father        skin   Stroke Brother    Heart disease Brother    Seizures Brother    Thyroid disease Brother    Diabetes Maternal Grandfather    Hyperlipidemia Maternal Grandfather    Cancer Maternal Grandmother        breast   Hyperlipidemia Maternal Grandmother    Hypertension Maternal Grandmother    Heart disease Paternal Grandfather    Hypertension Paternal Grandfather     Social History   Socioeconomic History   Marital status: Single    Spouse name: Not on file   Number of children: 0   Years of education: Not on file   Highest education level: Not on file  Occupational History   Occupation: Restaurant manager, fast food: [employer]  Tobacco Use   Smoking status: Every Day    Packs/day: 1.00    Years: 25.00    Total pack years: 25.00    Types: E-cigarettes, Cigarettes    Last attempt to quit: 2013    Years since quitting: 11.0   Smokeless tobacco: Never   Tobacco comments:    Currently only vapes  Vaping Use   Vaping Use: Every day   Substances: Nicotine, Flavoring   Devices: Mod  Substance and Sexual Activity   Alcohol use: Not Currently    Comment: 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, active in [place]   Drug use: Not Currently    Comment: previously used, no IV drugs, 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   Sexual activity: Not Currently    Partners: Male    Comment: patient given condoms  Other Topics Concern   Not on file  Social History Narrative   Not on file   Social Determinants of Health   Financial Resource Strain:  High Risk (09/30/2022)   Overall Financial Resource Strain (CARDIA)    Difficulty of Paying Living Expenses: Very hard  Food Insecurity: No Food Insecurity (09/30/2022)   Hunger Vital Sign    Worried About Running Out of Food in the Last Year: Never true    [place] in the Last Year: Never true  Transportation Needs: No Transportation Needs (09/30/2022)   PRAPARE - Hydrologist (Medical): No    Lack of Transportation (Non-Medical): No  Physical Activity: Not on file  Stress: Not on file  Social Connections: Not on file  Intimate Partner Violence: Not on  file    Review of Systems: See HPI, otherwise negative ROS  Physical Exam: BP 118/82   Pulse (!) 45   Temp (!) 96.2 F (35.7 C) (Temporal)   Resp 18   Ht '5\' 8"'$  (1.727 m)   Wt 74.2 kg   SpO2 100%   BMI 24.88 kg/m  General:   Alert,  pleasant and cooperative in NAD Head:  Normocephalic and atraumatic. Neck:  Supple; no masses or thyromegaly. Lungs:  Clear throughout to auscultation.    Heart:  Regular rate and rhythm. Abdomen:  Soft, nontender and nondistended. Normal bowel sounds, without guarding, and without rebound.   Neurologic:  Alert and  oriented x4;  grossly normal neurologically.  Impression/Plan: Aser Nylund is here for an endoscopy to be performed for nausea, vomiting, weight loss  Risks, benefits, limitations, and alternatives regarding  endoscopy have been reviewed with the patient.  Questions have been answered.  All parties agreeable.   Sherri Sear, MD  11/23/2022, 8:33 AM

## 2022-11-23 NOTE — Anesthesia Preprocedure Evaluation (Addendum)
Anesthesia Evaluation  Patient identified by MRN, date of birth, ID band Patient awake    Reviewed: Allergy & Precautions, NPO status , Patient's Chart, lab work & pertinent test results  History of Anesthesia Complications Negative for: history of anesthetic complications  Airway Mallampati: II  TM Distance: >3 FB     Dental  (+) Edentulous Lower, Edentulous Upper   Pulmonary asthma , Current SmokerPatient did not abstain from smoking., former smoker   Pulmonary exam normal        Cardiovascular Normal cardiovascular exam     Neuro/Psych  Headaches PSYCHIATRIC DISORDERS   Bipolar Disorder   CVA, Residual Symptoms    GI/Hepatic Neg liver ROS,GERD  Controlled,,Nausea and vomiting   Endo/Other  diabetes, Well Controlled    Renal/GU Renal disease (kidney stones)stones  negative genitourinary   Musculoskeletal negative musculoskeletal ROS (+)    Abdominal Normal abdominal exam  (+)   Peds negative pediatric ROS (+)  Hematology  (+) HIV  Anesthesia Other Findings Past Medical History: No date: Asthma No date: Bipolar 1 disorder (H[facility]) No date: Diabetes mellitus without complication (H[facility]) No date: History of CVA with residual deficit No date: HIV infection (H[facility])  Reproductive/Obstetrics                             Anesthesia Physical Anesthesia Plan  ASA: 2  Anesthesia Plan: General   Post-op Pain Management:    Induction: Intravenous  PONV Risk Score and Plan: 2 and Propofol infusion and TIVA  Airway Management Planned: Nasal Cannula and Natural Airway  Additional Equipment:   Intra-op Plan:   Post-operative Plan:   Informed Consent: I have reviewed the patients History and Physical, chart, labs and discussed the procedure including the risks, benefits and alternatives for the proposed anesthesia with the patient or authorized representative who has indicated his/her  understanding and acceptance.     Dental advisory given  Plan Discussed with: CRNA and Surgeon  Anesthesia Plan Comments:         Anesthesia Quick Evaluation

## 2022-11-23 NOTE — Transfer of Care (Signed)
Immediate Anesthesia Transfer of Care Note  Patient: Troy [name]  Procedure(s) Performed: ESOPHAGOGASTRODUODENOSCOPY (EGD) WITH PROPOFOL  Patient Location: PACU  Anesthesia Type:General  Level of Consciousness: drowsy  Airway & Oxygen Therapy: Patient Spontanous Breathing  Post-op Assessment: Report given to RN and Post -op Vital signs reviewed and stable  Post vital signs: Reviewed and stable  Last Vitals:  Vitals Value Taken Time  BP 108/68 11/23/22 0850  Temp 35.8 0850  Pulse 63 11/23/22 0850  Resp 19 11/23/22 0850  SpO2 98 % 11/23/22 0850    Last Pain:  Vitals:   11/23/22 0753  TempSrc: Temporal  PainSc: 0-No pain         Complications: No notable events documented.

## 2022-11-24 ENCOUNTER — Ambulatory Visit (INDEPENDENT_AMBULATORY_CARE_PROVIDER_SITE_OTHER): Payer: Medicaid Other | Admitting: Psychiatry

## 2022-11-24 ENCOUNTER — Encounter: Payer: Self-pay | Admitting: Gastroenterology

## 2022-11-24 ENCOUNTER — Telehealth: Payer: Self-pay

## 2022-11-24 ENCOUNTER — Encounter: Payer: Self-pay | Admitting: Psychiatry

## 2022-11-24 ENCOUNTER — Other Ambulatory Visit: Payer: Self-pay

## 2022-11-24 VITALS — BP 114/71 | HR 56 | Temp 97.9°F | Ht 68.0 in | Wt 160.0 lb

## 2022-11-24 DIAGNOSIS — F3132 Bipolar disorder, current episode depressed, moderate: Secondary | ICD-10-CM | POA: Diagnosis not present

## 2022-11-24 DIAGNOSIS — Z1211 Encounter for screening for malignant neoplasm of colon: Secondary | ICD-10-CM

## 2022-11-24 DIAGNOSIS — F4310 Post-traumatic stress disorder, unspecified: Secondary | ICD-10-CM | POA: Diagnosis not present

## 2022-11-24 DIAGNOSIS — G4700 Insomnia, unspecified: Secondary | ICD-10-CM

## 2022-11-24 DIAGNOSIS — Z419 Encounter for procedure for purposes other than remedying health state, unspecified: Secondary | ICD-10-CM | POA: Diagnosis not present

## 2022-11-24 LAB — SURGICAL PATHOLOGY

## 2022-11-24 MED ORDER — ESZOPICLONE 3 MG PO TABS: 3.0000 mg | ORAL_TABLET | Freq: Every day | ORAL | 1 refills | Status: DC

## 2022-11-24 MED ORDER — NA SULFATE-K SULFATE-MG SULF 17.5-3.13-1.6 GM/177ML PO SOLN: 354.0000 mL | Freq: Once | ORAL | 0 refills | Status: AC

## 2022-11-24 NOTE — Patient Instructions (Signed)
Continue Abilify 15 mg  Obtain EKG Increase sertraline 100 mg at night  Increase Lunesta 3 mg at night as needed for sleep Next appointment: 4/1 at 11AM

## 2022-11-24 NOTE — Telephone Encounter (Signed)
Per EGD report patient need to schedule a colonoscopy screening

## 2022-11-24 NOTE — Telephone Encounter (Signed)
Called patient and got patient schedule for 12/14/2022. She only wanted to come to [place]. Went over instructions, sent to [employer] and mailed them. Sent prep the pharmacy.

## 2022-12-05 ENCOUNTER — Telehealth: Payer: Self-pay

## 2022-12-05 NOTE — Telephone Encounter (Signed)
pt called left message that he needed a refill on the zoloft.

## 2022-12-05 NOTE — Telephone Encounter (Signed)
called pharmacy they do have a rx from dec on hold. pharamcy will get rx ready

## 2022-12-05 NOTE — Telephone Encounter (Signed)
tried to call patient it states that the number can not go threw at this time please try your call again later.

## 2022-12-07 ENCOUNTER — Telehealth: Payer: Self-pay | Admitting: Pharmacist

## 2022-12-07 ENCOUNTER — Other Ambulatory Visit: Payer: 59 | Admitting: Pharmacist

## 2022-12-07 NOTE — Progress Notes (Signed)
   Outreach Note  12/07/2022 Name: Antron Seth MRN: [id number] DOB: 22-Jul-1971  Referred by: Virginia Crews, MD Reason for referral : No chief complaint on file.   Was unable to reach patient via telephone today and have left HIPAA compliant voicemail asking patient to return my call. Outreach attempt #2.   Follow Up Plan: Will attempt to reach patient by telephone again within the next 30 days  Wallace Cullens, PharmD, [facility] [phone]

## 2022-12-08 ENCOUNTER — Telehealth: Payer: Self-pay

## 2022-12-08 NOTE — Telephone Encounter (Signed)
left message that pharmacy was working on the abilify and zoloft but that the lunesta could not be fill until 3-1st

## 2022-12-08 NOTE — Telephone Encounter (Signed)
tried to call patient to get more details. had to leave a message.

## 2022-12-08 NOTE — Telephone Encounter (Signed)
Pt left message that he needed a refill on his medications.

## 2022-12-08 NOTE — Telephone Encounter (Signed)
spoke with pharmacy and they stated that they can go ahead and fill the avilify and the zoloft today but that the lunesta he could not get until 12-23-22

## 2022-12-09 ENCOUNTER — Other Ambulatory Visit: Payer: Self-pay | Admitting: Psychiatry

## 2022-12-09 ENCOUNTER — Telehealth: Payer: Self-pay

## 2022-12-09 MED ORDER — SERTRALINE HCL 100 MG PO TABS: 100.0000 mg | ORAL_TABLET | Freq: Every day | ORAL | 0 refills | Status: DC

## 2022-12-09 NOTE — Telephone Encounter (Signed)
Ordered. I advised him to double the dose and to contact the office when it is low so that he can get a refill.

## 2022-12-09 NOTE — Telephone Encounter (Signed)
pt called states that you increased the zoloft from 75m to 1027mand the pharmacy did not have a rx for the 10072moloft.

## 2022-12-11 ENCOUNTER — Other Ambulatory Visit: Payer: Self-pay | Admitting: Psychiatry

## 2022-12-13 ENCOUNTER — Telehealth: Payer: Self-pay

## 2022-12-13 NOTE — Telephone Encounter (Signed)
Patient states no one told him he had to have clear liquids the day before procedure. Informed patient that I told him when we schedule it and it was in his colonoscopy instructions. He states he has not receive the colonoscopy in the mail and he couldn't find them on mychart. Confirmed address and informed patient that the information is under communications and then letters on mychart. Patient asked if he really needed to have this done and informed patient Dr. Marius Ditch recommend this. He reschedule procedure to 01/16/2023. He said he still has the prep at home. Went over new instructions, mailed them and sent to [employer]. Called endo and talk to [facility] and she will get patient moved

## 2022-12-18 ENCOUNTER — Other Ambulatory Visit: Payer: Self-pay | Admitting: Family Medicine

## 2022-12-19 ENCOUNTER — Other Ambulatory Visit: Payer: Self-pay

## 2022-12-19 ENCOUNTER — Ambulatory Visit (INDEPENDENT_AMBULATORY_CARE_PROVIDER_SITE_OTHER): Payer: Medicaid Other | Admitting: Infectious Disease

## 2022-12-19 ENCOUNTER — Encounter: Payer: Self-pay | Admitting: Infectious Disease

## 2022-12-19 ENCOUNTER — Other Ambulatory Visit (HOSPITAL_COMMUNITY): Payer: Self-pay

## 2022-12-19 VITALS — BP 127/83 | HR 70 | Temp 98.0°F | Ht 68.0 in | Wt 163.0 lb

## 2022-12-19 DIAGNOSIS — K2100 Gastro-esophageal reflux disease with esophagitis, without bleeding: Secondary | ICD-10-CM

## 2022-12-19 DIAGNOSIS — Z113 Encounter for screening for infections with a predominantly sexual mode of transmission: Secondary | ICD-10-CM

## 2022-12-19 DIAGNOSIS — Z139 Encounter for screening, unspecified: Secondary | ICD-10-CM | POA: Diagnosis not present

## 2022-12-19 DIAGNOSIS — E785 Hyperlipidemia, unspecified: Secondary | ICD-10-CM | POA: Diagnosis not present

## 2022-12-19 DIAGNOSIS — B20 Human immunodeficiency virus [HIV] disease: Secondary | ICD-10-CM | POA: Diagnosis not present

## 2022-12-19 DIAGNOSIS — Z7185 Encounter for immunization safety counseling: Secondary | ICD-10-CM

## 2022-12-19 DIAGNOSIS — A539 Syphilis, unspecified: Secondary | ICD-10-CM

## 2022-12-19 MED ORDER — BIKTARVY 50-200-25 MG PO TABS
1.0000 | ORAL_TABLET | Freq: Every day | ORAL | 11 refills | Status: DC
  Filled 2022-12-19 – 2022-12-20 (×2): qty 30, 30d supply, fill #0

## 2022-12-19 MED ORDER — ATORVASTATIN CALCIUM 20 MG PO TABS
20.0000 mg | ORAL_TABLET | Freq: Every day | ORAL | 11 refills | Status: DC
  Filled 2022-12-19: qty 30, 30d supply, fill #0

## 2022-12-19 NOTE — Progress Notes (Signed)
Subjective:  Chief complaint follow-up for HIV disease on medications   Patient ID: Troy [name], male    DOB: 02/06/1971, 51 y.o.   MRN: [id number]  HPI  At his visit with me I discovered that he had been on a proton pump inhibitor for years and also not been taking Odefsey with chewable food but fortunately still remained undetectable.  We switched him over to [facility] with intention to recheck labs a month later but have not been checked since last on the end of October.  He has had upper endoscopy and scheduled for colonoscopy with GI.  I also recommended we screening for anal cancer with anal Pap smear that I did not want to have undergo Pap smear testing today.     Past Medical History:  Diagnosis Date   Asthma    Bipolar 1 disorder ([place])    Diabetes mellitus without complication ([place])    controlled by weight loss   GERD (gastroesophageal reflux disease)    History of CVA with residual deficit    HIV infection ([place])    Hyperlipidemia 08/22/2022   Migraine headache    2-3 X/month   Nephrolithiasis    Wears dentures    full upper and lower    Past Surgical History:  Procedure Laterality Date   COLONOSCOPY     COLONOSCOPY WITH PROPOFOL N/A 05/30/2019   Procedure: COLONOSCOPY WITH PROPOFOL;  Surgeon: Lin Landsman, MD;  Location: [facility];  Service: Gastroenterology;  Laterality: N/A;   ELBOW SURGERY Left    fracture   ESOPHAGOGASTRODUODENOSCOPY (EGD) WITH PROPOFOL N/A 05/30/2019   Procedure: ESOPHAGOGASTRODUODENOSCOPY (EGD) WITH PROPOFOL;  Surgeon: Lin Landsman, MD;  Location: [facility];  Service: Gastroenterology;  Laterality: N/A;   ESOPHAGOGASTRODUODENOSCOPY (EGD) WITH PROPOFOL N/A 09/12/2019   Procedure: ESOPHAGOGASTRODUODENOSCOPY (EGD) WITH PROPOFOL;  Surgeon: Lin Landsman, MD;  Location[facility] END[facility]COPY;  Service: Gastroenterology;  Laterality: N/A;   ESOPHAGOGASTRODUODENOSCOPY (EGD) WITH PROPOFOL N/A 11/23/2022   Procedure:  ESOPHAGOGASTRODUODENOSCOPY (EGD) WITH PROPOFOL;  Surgeon: Lin Landsman, MD;  Location: [facility] ENDOSCOPY;  Service: Gastroenterology;  Laterality: N/A;   FACIAL COSMETIC SURGERY     GASTRIC BYPASS     HERNIA REPAIR     KIDNEY STONE SURGERY     renal artery repair      Family History  Problem Relation Age of Onset   Bipolar disorder Mother    COPD Mother    Diabetes Mother    Hypertension Mother    Hyperlipidemia Mother    Healthy Father    Cancer Father        skin   Stroke Brother    Heart disease Brother    Seizures Brother    Thyroid disease Brother    Diabetes Maternal Grandfather    Hyperlipidemia Maternal Grandfather    Cancer Maternal Grandmother        breast   Hyperlipidemia Maternal Grandmother    Hypertension Maternal Grandmother    Heart disease Paternal Grandfather    Hypertension Paternal Grandfather       Social History   Socioeconomic History   Marital status: Single    Spouse name: Not on file   Number of children: 0   Years of education: Not on file   Highest education level: Not on file  Occupational History   Occupation: Radio broadcast assistant    Employer: [employer]  Tobacco Use   Smoking status: Every Day    Packs/day: 1.00    Years: 25.00  Total pack years: 25.00    Types: E-cigarettes, Cigarettes    Last attempt to quit: 2013    Years since quitting: 11.1   Smokeless tobacco: Never   Tobacco comments:    Currently only vapes  Vaping Use   Vaping Use: Every day   Substances: Nicotine, Flavoring   Devices: Mod  Substance and Sexual Activity   Alcohol use: Not Currently    Comment: 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, active in [place]   Drug use: Not Currently    Comment: previously used, no IV drugs, 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   Sexual activity: Not Currently    Partners: Male    Comment: patient given condoms  Other Topics Concern   Not on file  Social History Narrative   Not on file   Social Determinants of Health   Financial Resource Strain: High Risk  (09/30/2022)   Overall Financial Resource Strain (CARDIA)    Difficulty of Paying Living Expenses: Very hard  Food Insecurity: No Food Insecurity (09/30/2022)   Hunger Vital Sign    Worried About Running Out of Food in the Last Year: Never true    [place] in the Last Year: Never true  Transportation Needs: No Transportation Needs (09/30/2022)   PRAPARE - Hydrologist (Medical): No    Lack of Transportation (Non-Medical): No  Physical Activity: Not on file  Stress: Not on file  Social Connections: Not on file    Allergies  Allergen Reactions   Niacin Anaphylaxis   Orange Oil Anaphylaxis   Zolpidem     Other reaction(s): Unknown Sleep walking and sleep eating     Current Outpatient Medications:    albuterol (VENTOLIN HFA) 108 (90 Base) MCG/ACT inhaler, Inhale 2 puffs into the lungs every 6 (six) hours as needed for wheezing or shortness of breath., Disp: 8 g, Rfl: 2   ARIPiprazole (ABILIFY) 15 MG tablet, Take 1 tablet (15 mg total) by mouth daily., Disp: 90 tablet, Rfl: 0   atorvastatin (LIPITOR) 20 MG tablet, Take 1 tablet (20 mg total) by mouth daily., Disp: 30 tablet, Rfl: 11   beclomethasone (QVAR) 80 MCG/ACT inhaler, Inhale 1 puff into the lungs 2 (two) times daily., Disp: 1 each, Rfl: 2   bictegravir-emtricitabine-tenofovir AF (BIKTARVY) 50-200-25 MG TABS tablet, Take 1 tablet by mouth daily., Disp: 30 tablet, Rfl: 11   Budeson-Glycopyrrol-Formoterol (BREZTRI AEROSPHERE) 160-9-4.8 MCG/ACT AERO, Inhale 2 puffs into the lungs 2 (two) times daily., Disp: 10.7 g, Rfl: 11   cetirizine (ZYRTEC) 10 MG tablet, Take 1 tablet (10 mg total) by mouth daily., Disp: 90 tablet, Rfl: 3   eszopiclone (LUNESTA) 2 MG TABS tablet, Take 1 tablet (2 mg total) by mouth at bedtime as needed for sleep. Take immediately before bedtime, Disp: 30 tablet, Rfl: 0   eszopiclone 3 MG TABS, Take 1 tablet (3 mg total) by mouth at bedtime. Take immediately before bedtime, Disp:  30 tablet, Rfl: 1   fluticasone (FLONASE) 50 MCG/ACT nasal spray, Place 2 sprays into both nostrils daily., Disp: 16 g, Rfl: 6   montelukast (SINGULAIR) 10 MG tablet, TAKE 1 TABLET BY MOUTH EVERY DAY, Disp: 90 tablet, Rfl: 3   omeprazole (PRILOSEC) 40 MG capsule, TAKE 1 CAPSULE BY MOUTH EVERY DAY BEFORE BREAKFAST, Disp: 90 capsule, Rfl: 1   ondansetron (ZOFRAN ODT) 4 MG disintegrating tablet, Take 1 tablet (4 mg total) by mouth every 8 (eight) hours as needed., Disp: 20 tablet, Rfl: 0   sertraline (ZOLOFT) 100  MG tablet, Take 1 tablet (100 mg total) by mouth at bedtime., Disp: 90 tablet, Rfl: 0   SUMAtriptan (IMITREX) 100 MG tablet, TAKE ONE TABLET BY MOUTH AT ONSET OF THE HEADACHE. MAY REPEAT IN TWO HOURS, Disp: 10 tablet, Rfl: 3   tiZANidine (ZANAFLEX) 4 MG tablet, TAKE 1 CAPSULE (4 MG TOTAL) BY MOUTH 2 (TWO) TIMES DAILY., Disp: 180 tablet, Rfl: 1   topiramate (TOPAMAX) 100 MG tablet, TAKE 1 TABLET BY MOUTH TWICE A DAY, Disp: 180 tablet, Rfl: 3   Review of Systems  Constitutional:  Negative for activity change, appetite change, chills, diaphoresis, fatigue, fever and unexpected weight change.  HENT:  Negative for congestion, rhinorrhea, sinus pressure, sneezing, sore throat and trouble swallowing.   Eyes:  Negative for photophobia and visual disturbance.  Respiratory:  Negative for cough, chest tightness, shortness of breath, wheezing and stridor.   Cardiovascular:  Negative for chest pain, palpitations and leg swelling.  Gastrointestinal:  Negative for abdominal distention, abdominal pain, anal bleeding, blood in stool, constipation, diarrhea, nausea and vomiting.  Genitourinary:  Negative for difficulty urinating, dysuria, flank pain and hematuria.  Musculoskeletal:  Negative for arthralgias, back pain, gait problem, joint swelling and myalgias.  Skin:  Negative for color change, pallor, rash and wound.  Neurological:  Negative for dizziness, tremors, weakness and light-headedness.   Hematological:  Negative for adenopathy. Does not bruise/bleed easily.  Psychiatric/Behavioral:  Negative for agitation, behavioral problems, confusion, decreased concentration, dysphoric mood and sleep disturbance.        Objective:   Physical Exam Constitutional:      Appearance: He is well-developed.  HENT:     Head: Normocephalic and atraumatic.  Eyes:     Conjunctiva/sclera: Conjunctivae normal.  Cardiovascular:     Rate and Rhythm: Normal rate and regular rhythm.  Pulmonary:     Effort: Pulmonary effort is normal. No respiratory distress.     Breath sounds: No wheezing.  Abdominal:     General: There is no distension.     Palpations: Abdomen is soft.  Musculoskeletal:        General: No tenderness. Normal range of motion.     Cervical back: Normal range of motion and neck supple.  Skin:    General: Skin is warm and dry.     Coloration: Skin is not pale.     Findings: No erythema or rash.  Neurological:     General: No focal deficit present.     Mental Status: He is alert and oriented to person, place, and time.  Psychiatric:        Mood and Affect: Mood normal.        Behavior: Behavior normal.        Thought Content: Thought content normal.        Judgment: Judgment normal.           Assessment & Plan:   HIV disease:  I will add order HIV viral load CD4 count CBC with differential CMP, RPR GC and chlamydia and I will continue  [employer],  prescription   STI screening:he wanted to put off swabs until next visit but will recheck RPR today  Syphilis hx: will check RPR  Anal cancer screening: anal pap smear at next visit

## 2022-12-20 ENCOUNTER — Other Ambulatory Visit (HOSPITAL_COMMUNITY): Payer: Self-pay

## 2022-12-20 LAB — T-HELPER CELLS (CD4) COUNT (NOT AT ARMC)
CD4 % Helper T Cell: 25 % — ABNORMAL LOW (ref 33–65)
CD4 T Cell Abs: 648 /uL (ref 400–1790)

## 2022-12-21 ENCOUNTER — Other Ambulatory Visit: Payer: Medicaid Other | Admitting: Pharmacist

## 2022-12-21 ENCOUNTER — Telehealth: Payer: Self-pay

## 2022-12-21 ENCOUNTER — Other Ambulatory Visit (HOSPITAL_COMMUNITY): Payer: Self-pay

## 2022-12-21 NOTE — Progress Notes (Signed)
   12/21/2022 Name: Troy [name] MRN: [id number] DOB: 1971-01-30  Chief Complaint  Patient presents with   Medication Assistance    Troy [name] is a 52 y.o. year old male who was referred to the pharmacist by their Case Management Team  for assistance in managing medication access.   Patient is participating in a Managed Medicaid Plan:  Yes, Troy [name]  Subjective:  Care Team: Primary Care Provider: Virginia Crews, MD  Psychiatrist: Norman Clay, MD; Next Scheduled Visit: 01/23/2023 GI Specialist: Troy Landsman, MD Infectious Disease Specialist: Troy [name], Troy Islam, MD; Next Scheduled Visit: 01/23/2023 [facility] Social Worker: Troy [name], BSW, CDP  Medication Access/Adherence  [id number] [place] [place]Phone: [phone]: [phone] [facility] [address]Phone: [phone]: [phone] [address] [place]Phone: [phone]: [phone]  Patient reports affordability concerns with their medications: No  Patient reports access/transportation concerns to their pharmacy: No  Patient reports adherence concerns with their medications:  No     Today patient reports his [facility] concerns [facility]   Follow Up Plan:   Patient denies further medication questions or concerns today Provide patient with contact information for clinic pharmacist to contact if needed in future for medication questions/concerns   EWallace [name] PharmD, BSouth [phone]

## 2022-12-21 NOTE — Telephone Encounter (Signed)
[facility] Patient Advocate Encounter  I tried filling patient Biktarvy prescription at [facility] and was rejected , patient will need to contact medicaid and let them know they do not have any other insurance.   I spoke with the patient and he said he called and he is getting his script [facility] with [facility] .  Ileene Patrick, [facility] Patient [facility] Phone: [phone] Fax:  [phone]

## 2022-12-22 ENCOUNTER — Telehealth: Payer: Self-pay

## 2022-12-22 ENCOUNTER — Other Ambulatory Visit: Payer: Self-pay | Admitting: Pharmacist

## 2022-12-22 DIAGNOSIS — B20 Human immunodeficiency virus [HIV] disease: Secondary | ICD-10-CM

## 2022-12-22 LAB — CBC WITH DIFFERENTIAL/PLATELET
Absolute Monocytes: 543 cells/uL (ref 200–950)
Basophils Absolute: 31 cells/uL (ref 0–200)
Basophils Relative: 0.5 %
Eosinophils Absolute: 49 cells/uL (ref 15–500)
Eosinophils Relative: 0.8 %
HCT: 41.8 % (ref 38.5–50.0)
Hemoglobin: 14.5 g/dL (ref 13.2–17.1)
Lymphs Abs: 2830 cells/uL (ref 850–3900)
MCH: 30.7 pg (ref 27.0–33.0)
MCHC: 34.7 g/dL (ref 32.0–36.0)
MCV: 88.6 fL (ref 80.0–100.0)
MPV: 11.4 fL (ref 7.5–12.5)
Monocytes Relative: 8.9 %
Neutro Abs: 2647 cells/uL (ref 1500–7800)
Neutrophils Relative %: 43.4 %
Platelets: 209 10*3/uL (ref 140–400)
RBC: 4.72 10*6/uL (ref 4.20–5.80)
RDW: 13.3 % (ref 11.0–15.0)
Total Lymphocyte: 46.4 %
WBC: 6.1 10*3/uL (ref 3.8–10.8)

## 2022-12-22 LAB — COMPLETE METABOLIC PANEL WITH GFR
AG Ratio: 2 (calc) (ref 1.0–2.5)
ALT: 27 U/L (ref 9–46)
AST: 32 U/L (ref 10–40)
Albumin: 4.3 g/dL (ref 3.6–5.1)
Alkaline phosphatase (APISO): 49 U/L (ref 36–130)
BUN: 11 mg/dL (ref 7–25)
CO2: 25 mmol/L (ref 20–32)
Calcium: 9 mg/dL (ref 8.6–10.3)
Chloride: 109 mmol/L (ref 98–110)
Creat: 1.15 mg/dL (ref 0.60–1.29)
Globulin: 2.2 g/dL (calc) (ref 1.9–3.7)
Glucose, Bld: 77 mg/dL (ref 65–99)
Potassium: 4.2 mmol/L (ref 3.5–5.3)
Sodium: 141 mmol/L (ref 135–146)
Total Bilirubin: 0.7 mg/dL (ref 0.2–1.2)
Total Protein: 6.5 g/dL (ref 6.1–8.1)
eGFR: 78 mL/min/{1.73_m2} (ref >=60)

## 2022-12-22 LAB — LIPID PANEL
Cholesterol: 107 mg/dL (ref <200)
HDL: 49 mg/dL (ref >=40)
LDL Cholesterol (Calc): 45 mg/dL (calc)
Non-HDL Cholesterol (Calc): 58 mg/dL (calc) (ref <130)
Total CHOL/HDL Ratio: 2.2 (calc) (ref <5.0)
Triglycerides: 58 mg/dL (ref <150)

## 2022-12-22 LAB — HIV-1 RNA QUANT-NO REFLEX-BLD
HIV 1 RNA Quant: NOT DETECTED Copies/mL
HIV-1 RNA Quant, Log: NOT DETECTED Log cps/mL

## 2022-12-22 LAB — RPR: RPR Ser Ql: NONREACTIVE

## 2022-12-22 MED ORDER — BIKTARVY 50-200-25 MG PO TABS: 1.0000 | ORAL_TABLET | Freq: Every day | ORAL | 0 refills | Status: AC

## 2022-12-22 NOTE — Telephone Encounter (Signed)
Patient left voicemail requesting to go over recent viral load. Relayed that this is not back yet and can take a few days to come back. Discussed that CD4 is still healthy at over 600. Patient verbalized understanding and has no further questions.   Beryle Flock, RN

## 2022-12-22 NOTE — Progress Notes (Signed)
Medication Samples have been provided to the patient.  Drug name: Biktarvy        Strength: 50/200/25 mg       Qty: 7 tablets (1 week)   LOT: [id number]   Exp.Date: 12/2024  Dosing instructions: Take one tablet by mouth once daily  The patient has been instructed regarding the correct time, dose, and frequency of taking this medication, including desired effects and most common side effects.   Elener Custodio L. Eber Hong, PharmD, BCIDP, AAHIVP, CPP Clinical Pharmacist Practitioner Infectious Diseases [place] [facility] 10/05/2020, 10:07 AM

## 2022-12-23 DIAGNOSIS — Z419 Encounter for procedure for purposes other than remedying health state, unspecified: Secondary | ICD-10-CM | POA: Diagnosis not present

## 2023-01-12 ENCOUNTER — Other Ambulatory Visit: Payer: Self-pay | Admitting: Psychiatry

## 2023-01-16 ENCOUNTER — Other Ambulatory Visit: Payer: Self-pay

## 2023-01-16 ENCOUNTER — Telehealth: Payer: Self-pay | Admitting: Gastroenterology

## 2023-01-16 ENCOUNTER — Telehealth: Payer: Self-pay

## 2023-01-16 NOTE — Telephone Encounter (Signed)
-----   Message from Lin Landsman, MD sent at 01/16/2023 10:26 AM EDT ----- Regarding: Did not receive his prep This patient did not receive his prep.  He called Dr. Allen Norris who was on-call and apparently Dr. Allen Norris called in the prescription and by the time he went to the pharmacy, it was closed.  Please reschedule with a 2-day prep  RV

## 2023-01-16 NOTE — Telephone Encounter (Signed)
Documented in other telephone call  

## 2023-01-16 NOTE — Telephone Encounter (Signed)
[facility] [address] and they said [facility] Sup[facility] is ready for pick up for him at the pharmacy. They had it ready 11/24/2022 and then patient never picked up the medication but then refilled the medication on 01/13/2023. Called and left a message for call back

## 2023-01-16 NOTE — Telephone Encounter (Signed)
Patient calling to reschedule colonoscopy.Requesting call back.  

## 2023-01-16 NOTE — Telephone Encounter (Signed)
Patient states that he moved and threw away his prep. She states he talk to Dr. Allen Norris yesterday afternoon and he resent the prep to the pharmacy. He states but by the time he got to the pharmacy the pharmacy was closed. He states he now does have the prep at his house. He reschedule the colonoscopy to 02/07/2023. Informed trish of the moved. Mailed out new instructions to his old address per patient request because he can not get mail to new address.
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
## 2023-01-23 ENCOUNTER — Ambulatory Visit (INDEPENDENT_AMBULATORY_CARE_PROVIDER_SITE_OTHER): Payer: Medicaid Other | Admitting: Infectious Disease

## 2023-01-23 ENCOUNTER — Other Ambulatory Visit: Payer: Self-pay

## 2023-01-23 ENCOUNTER — Encounter: Payer: Self-pay | Admitting: Psychiatry

## 2023-01-23 ENCOUNTER — Ambulatory Visit (INDEPENDENT_AMBULATORY_CARE_PROVIDER_SITE_OTHER): Payer: Medicaid Other | Admitting: Psychiatry

## 2023-01-23 ENCOUNTER — Other Ambulatory Visit (HOSPITAL_COMMUNITY)
Admission: RE | Admit: 2023-01-23 | Discharge: 2023-01-23 | Disposition: A | Discharge status: Home / Self Care | Payer: Medicaid Other | Source: Ambulatory Visit | Attending: Infectious Disease | Admitting: Infectious Disease

## 2023-01-23 ENCOUNTER — Encounter: Payer: Self-pay | Admitting: Infectious Disease

## 2023-01-23 VITALS — BP 133/71 | HR 107 | Resp 16 | Ht 68.0 in | Wt 155.0 lb

## 2023-01-23 VITALS — BP 122/77 | HR 63 | Temp 98.4°F | Ht 68.0 in | Wt 155.4 lb

## 2023-01-23 DIAGNOSIS — F3132 Bipolar disorder, current episode depressed, moderate: Secondary | ICD-10-CM | POA: Diagnosis not present

## 2023-01-23 DIAGNOSIS — Z85048 Personal history of other malignant neoplasm of rectum, rectosigmoid junction, and anus: Secondary | ICD-10-CM | POA: Insufficient documentation

## 2023-01-23 DIAGNOSIS — E785 Hyperlipidemia, unspecified: Secondary | ICD-10-CM

## 2023-01-23 DIAGNOSIS — B20 Human immunodeficiency virus [HIV] disease: Secondary | ICD-10-CM | POA: Diagnosis not present

## 2023-01-23 DIAGNOSIS — G4700 Insomnia, unspecified: Secondary | ICD-10-CM

## 2023-01-23 DIAGNOSIS — Z0000 Encounter for general adult medical examination without abnormal findings: Secondary | ICD-10-CM

## 2023-01-23 DIAGNOSIS — F319 Bipolar disorder, unspecified: Secondary | ICD-10-CM | POA: Diagnosis not present

## 2023-01-23 DIAGNOSIS — Z08 Encounter for follow-up examination after completed treatment for malignant neoplasm: Secondary | ICD-10-CM | POA: Diagnosis not present

## 2023-01-23 DIAGNOSIS — F4310 Post-traumatic stress disorder, unspecified: Secondary | ICD-10-CM | POA: Diagnosis not present

## 2023-01-23 DIAGNOSIS — Z419 Encounter for procedure for purposes other than remedying health state, unspecified: Secondary | ICD-10-CM | POA: Diagnosis not present

## 2023-01-23 MED ORDER — BIKTARVY 50-200-25 MG PO TABS: 1.0000 | ORAL_TABLET | Freq: Every day | ORAL | 11 refills | Status: DC

## 2023-01-23 MED ORDER — PRAZOSIN HCL 1 MG PO CAPS: 1.0000 mg | ORAL_CAPSULE | Freq: Every day | ORAL | 1 refills | Status: DC

## 2023-01-23 MED ORDER — SERTRALINE HCL 100 MG PO TABS: 100.0000 mg | ORAL_TABLET | Freq: Every day | ORAL | 0 refills | Status: DC

## 2023-01-23 MED ORDER — ARIPIPRAZOLE 15 MG PO TABS: 15.0000 mg | ORAL_TABLET | Freq: Every day | ORAL | 0 refills | Status: DC

## 2023-01-23 MED ORDER — ATORVASTATIN CALCIUM 20 MG PO TABS: 20.0000 mg | ORAL_TABLET | Freq: Every day | ORAL | 11 refills | Status: DC

## 2023-01-23 MED ORDER — ESZOPICLONE 3 MG PO TABS: 3.0000 mg | ORAL_TABLET | Freq: Every day | ORAL | 1 refills | Status: DC

## 2023-01-23 NOTE — Progress Notes (Signed)
Subjective:  Chief complaint for HIV disease on medications coming back for anal Pap smear Patient ID: Troy [name], male    DOB: 19-Jan-1971, 52 y.o.   MRN: [id number]  HPI  52 year old who had been on [place] but was not taking it with food and was taking it with a proton pump inhibitor.  Fortunately was still suppressed we switched him over to [facility] in the interim.  He has remained undetectable.  He has had his upper endoscopy revealed a hiatal hernia he is scheduled for colonoscopy.  We brought him back to clinic to perform anal Pap smear.  Seeing psychiatry for treatment of his bipolar disorder PTSD and insomnia   Past Medical History:  Diagnosis Date   Asthma    Bipolar 1 disorder    Diabetes mellitus without complication    controlled by weight loss   GERD (gastroesophageal reflux disease)    History of CVA with residual deficit    HIV infection    Hyperlipidemia 08/22/2022   Migraine headache    2-3 X/month   Nephrolithiasis    Wears dentures    full upper and lower    Past Surgical History:  Procedure Laterality Date   COLONOSCOPY     COLONOSCOPY WITH PROPOFOL N/A 05/30/2019   Procedure: COLONOSCOPY WITH PROPOFOL;  Surgeon: Lin Landsman, MD;  Location: [place];  Service: Gastroenterology;  Laterality: N/A;   ELBOW SURGERY Left    fracture   ESOPHAGOGASTRODUODENOSCOPY (EGD) WITH PROPOFOL N/A 05/30/2019   Procedure: ESOPHAGOGASTRODUODENOSCOPY (EGD) WITH PROPOFOL;  Surgeon: Lin Landsman, MD;  Location: [place];  Service: Gastroenterology;  Laterality: N/A;   ESOPHAGOGASTRODUODENOSCOPY (EGD) WITH PROPOFOL N/A 09/12/2019   Procedure: ESOPHAGOGASTRODUODENOSCOPY (EGD) WITH PROPOFOL;  Surgeon: Lin Landsman, MD;  Location: [facility] ENDOSCOPY;  Service: Gastroenterology;  Laterality: N/A;   ESOPHAGOGASTRODUODENOSCOPY (EGD) WITH PROPOFOL N/A 11/23/2022   Procedure: ESOPHAGOGASTRODUODENOSCOPY (EGD) WITH PROPOFOL;  Surgeon: Lin Landsman,  MD;  Location: [facility] ENDOSCOPY;  Service: Gastroenterology;  Laterality: N/A;   FACIAL COSMETIC SURGERY     GASTRIC BYPASS     HERNIA REPAIR     KIDNEY STONE SURGERY     renal artery repair      Family History  Problem Relation Age of Onset   Bipolar disorder Mother    COPD Mother    Diabetes Mother    Hypertension Mother    Hyperlipidemia Mother    Healthy Father    Cancer Father        skin   Stroke Brother    Heart disease Brother    Seizures Brother    Thyroid disease Brother    Diabetes Maternal Grandfather    Hyperlipidemia Maternal Grandfather    Cancer Maternal Grandmother        breast   Hyperlipidemia Maternal Grandmother    Hypertension Maternal Grandmother    Heart disease Paternal Grandfather    Hypertension Paternal Grandfather       Social History   Socioeconomic History   Marital status: Single    Spouse name: Not on file   Number of children: 0   Years of education: Not on file   Highest education level: Not on file  Occupational History   Occupation: Radio broadcast assistant    Employer: [employer]  Tobacco Use   Smoking status: Former    Packs/day: 1.00    Years: 25.00    Additional pack years: 0.00    Total pack years: 25.00    Types: Cigarettes  Quit date: 2013    Years since quitting: 11.2   Smokeless tobacco: Never   Tobacco comments:    Currently only vapes  Vaping Use   Vaping Use: Every day   Substances: Nicotine, Flavoring   Devices: Mod  Substance and Sexual Activity   Alcohol use: Not Currently    Comment: 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, active in [place]   Drug use: Not Currently    Comment: previously used, no IV drugs, 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   Sexual activity: Not Currently    Partners: Male    Comment: patient given condoms  Other Topics Concern   Not on file  Social History Narrative   Not on file   Social Determinants of Health   Financial Resource Strain: High Risk (09/30/2022)   Overall Financial Resource Strain (CARDIA)    Difficulty of Paying  Living Expenses: Very hard  Food Insecurity: No Food Insecurity (09/30/2022)   Hunger Vital Sign    Worried About Running Out of Food in the Last Year: Never true    [place] in the Last Year: Never true  Transportation Needs: No Transportation Needs (09/30/2022)   PRAPARE - Hydrologist (Medical): No    Lack of Transportation (Non-Medical): No  Physical Activity: Not on file  Stress: Not on file  Social Connections: Not on file    Allergies  Allergen Reactions   Niacin Anaphylaxis   Orange Oil Anaphylaxis   Zolpidem     Other reaction(s): Unknown Sleep walking and sleep eating     Current Outpatient Medications:    albuterol (VENTOLIN HFA) 108 (90 Base) MCG/ACT inhaler, Inhale 2 puffs into the lungs every 6 (six) hours as needed for wheezing or shortness of breath., Disp: 8 g, Rfl: 2   [START ON 01/30/2023] ARIPiprazole (ABILIFY) 15 MG tablet, Take 1 tablet (15 mg total) by mouth daily., Disp: 90 tablet, Rfl: 0   beclomethasone (QVAR) 80 MCG/ACT inhaler, Inhale 1 puff into the lungs 2 (two) times daily., Disp: 1 each, Rfl: 2   Budeson-Glycopyrrol-Formoterol (BREZTRI AEROSPHERE) 160-9-4.8 MCG/ACT AERO, Inhale 2 puffs into the lungs 2 (two) times daily., Disp: 10.7 g, Rfl: 11   cetirizine (ZYRTEC) 10 MG tablet, Take 1 tablet (10 mg total) by mouth daily., Disp: 90 tablet, Rfl: 3   eszopiclone 3 MG TABS, Take 1 tablet (3 mg total) by mouth at bedtime. Take immediately before bedtime, Disp: 30 tablet, Rfl: 1   fluticasone (FLONASE) 50 MCG/ACT nasal spray, Place 2 sprays into both nostrils daily., Disp: 16 g, Rfl: 6   montelukast (SINGULAIR) 10 MG tablet, TAKE 1 TABLET BY MOUTH EVERY DAY, Disp: 90 tablet, Rfl: 3   omeprazole (PRILOSEC) 40 MG capsule, TAKE 1 CAPSULE BY MOUTH EVERY DAY BEFORE BREAKFAST, Disp: 90 capsule, Rfl: 1   ondansetron (ZOFRAN ODT) 4 MG disintegrating tablet, Take 1 tablet (4 mg total) by mouth every 8 (eight) hours as needed., Disp:  20 tablet, Rfl: 0   prazosin (MINIPRESS) 1 MG capsule, Take 1 capsule (1 mg total) by mouth at bedtime., Disp: 30 capsule, Rfl: 1   [START ON 03/09/2023] sertraline (ZOLOFT) 100 MG tablet, Take 1 tablet (100 mg total) by mouth at bedtime., Disp: 90 tablet, Rfl: 0   SUMAtriptan (IMITREX) 100 MG tablet, TAKE ONE TABLET BY MOUTH AT ONSET OF THE HEADACHE. MAY REPEAT IN TWO HOURS, Disp: 10 tablet, Rfl: 3   tiZANidine (ZANAFLEX) 4 MG tablet, TAKE 1 CAPSULE (4 MG TOTAL) BY MOUTH  2 (TWO) TIMES DAILY., Disp: 180 tablet, Rfl: 1   topiramate (TOPAMAX) 100 MG tablet, TAKE 1 TABLET BY MOUTH TWICE A DAY, Disp: 180 tablet, Rfl: 3   atorvastatin (LIPITOR) 20 MG tablet, Take 1 tablet (20 mg total) by mouth daily., Disp: 30 tablet, Rfl: 11   bictegravir-emtricitabine-tenofovir AF (BIKTARVY) 50-200-25 MG TABS tablet, Take 1 tablet by mouth daily., Disp: 30 tablet, Rfl: 11   eszopiclone (LUNESTA) 2 MG TABS tablet, Take 1 tablet (2 mg total) by mouth at bedtime as needed for sleep. Take immediately before bedtime, Disp: 30 tablet, Rfl: 0   Review of Systems  Constitutional:  Negative for activity change, appetite change, chills, diaphoresis, fatigue, fever and unexpected weight change.  HENT:  Negative for congestion, rhinorrhea, sinus pressure, sneezing, sore throat and trouble swallowing.   Eyes:  Negative for photophobia and visual disturbance.  Respiratory:  Negative for cough, chest tightness, shortness of breath, wheezing and stridor.   Cardiovascular:  Negative for chest pain, palpitations and leg swelling.  Gastrointestinal:  Negative for abdominal distention, abdominal pain, anal bleeding, blood in stool, constipation, diarrhea, nausea and vomiting.  Genitourinary:  Negative for difficulty urinating, dysuria, flank pain and hematuria.  Musculoskeletal:  Negative for arthralgias, back pain, gait problem, joint swelling and myalgias.  Skin:  Negative for color change, pallor, rash and wound.  Neurological:   Negative for dizziness, tremors, weakness and light-headedness.  Hematological:  Negative for adenopathy. Does not bruise/bleed easily.  Psychiatric/Behavioral:  Positive for dysphoric mood and sleep disturbance. Negative for agitation, behavioral problems, confusion and decreased concentration.        Objective:   Physical Exam Exam conducted with a chaperone present.  Constitutional:      Appearance: He is well-developed.  HENT:     Head: Normocephalic and atraumatic.  Eyes:     Conjunctiva/sclera: Conjunctivae normal.  Cardiovascular:     Rate and Rhythm: Normal rate and regular rhythm.  Pulmonary:     Effort: Pulmonary effort is normal. No respiratory distress.     Breath sounds: No wheezing.  Abdominal:     General: There is no distension.     Palpations: Abdomen is soft.  Genitourinary:    Comments: Anal pap smear taken after cotton swab inserted roughly 3.5 cm into anal canal Musculoskeletal:        General: No tenderness. Normal range of motion.     Cervical back: Normal range of motion and neck supple.  Skin:    General: Skin is warm and dry.     Coloration: Skin is not pale.     Findings: No erythema or rash.  Neurological:     General: No focal deficit present.     Mental Status: He is alert and oriented to person, place, and time.  Psychiatric:        Mood and Affect: Mood normal.        Behavior: Behavior normal.        Thought Content: Thought content normal.        Judgment: Judgment normal.           Assessment & Plan:     HIV disease:  I have reviewed Pardeep Brandenburger's labs including viral load which was  Lab Results  Component Value Date   HIV1RNAQUANT Not Detected 12/19/2022   and cd4 which was  Lab Results  Component Value Date   CD4TABS 648 12/19/2022     I am continuing patient's prescription for Biktarvy  Screening for anal  pap smear: if this is abnormal will refer to [facility] for HRA  Hyperlipidemia: Continue his  atorvastatin  Depression, insomnia, bipolar disorder :  Following closely with psychiatry and is on sertraline that has been increased along with Abilify and [place]

## 2023-01-23 NOTE — Patient Instructions (Signed)
Continue Abilify 15 mg  Obtain EKG Continue sertraline 100 mg at night  Continue Lunesta 3 mg at night as needed for sleep Start prazosin 1 mg at night Next appointment: 5/28 at 11:30

## 2023-01-26 ENCOUNTER — Telehealth: Payer: Self-pay

## 2023-01-26 LAB — CYTOLOGY - PAP
Diagnosis: NEGATIVE
Diagnosis: REACTIVE

## 2023-01-26 NOTE — Telephone Encounter (Signed)
-----   Message from Truman Hayward, MD sent at 01/26/2023  1:44 PM EDT ----- Regarding: no precancerous cells  ----- Message ----- From: Interface, Lab In [facility] Sent: 01/26/2023   1:13 PM EDT To: Truman Hayward, MD

## 2023-02-04 ENCOUNTER — Other Ambulatory Visit: Payer: Self-pay | Admitting: Psychiatry

## 2023-02-06 ENCOUNTER — Encounter: Payer: Self-pay | Admitting: Gastroenterology

## 2023-02-07 ENCOUNTER — Ambulatory Visit: Admission: RE | Admit: 2023-02-07 | Payer: Medicaid Other | Source: Home / Self Care | Admitting: Gastroenterology

## 2023-02-07 ENCOUNTER — Encounter: Admission: RE | Payer: Self-pay | Source: Home / Self Care

## 2023-02-07 SURGERY — COLONOSCOPY WITH PROPOFOL
Anesthesia: General

## 2023-02-13 ENCOUNTER — Encounter (HOSPITAL_COMMUNITY): Payer: Self-pay | Admitting: *Deleted

## 2023-02-13 ENCOUNTER — Other Ambulatory Visit: Payer: Self-pay

## 2023-02-13 ENCOUNTER — Emergency Department (HOSPITAL_COMMUNITY)
Admission: EM | Admit: 2023-02-13 | Discharge: 2023-02-13 | Disposition: A | Discharge status: Home / Self Care | Payer: Medicaid Other | Attending: Emergency Medicine | Admitting: Emergency Medicine

## 2023-02-13 ENCOUNTER — Emergency Department (HOSPITAL_COMMUNITY): Payer: Medicaid Other

## 2023-02-13 DIAGNOSIS — Z21 Asymptomatic human immunodeficiency virus [HIV] infection status: Secondary | ICD-10-CM | POA: Diagnosis not present

## 2023-02-13 DIAGNOSIS — R1010 Upper abdominal pain, unspecified: Secondary | ICD-10-CM

## 2023-02-13 DIAGNOSIS — R109 Unspecified abdominal pain: Secondary | ICD-10-CM | POA: Diagnosis not present

## 2023-02-13 DIAGNOSIS — N200 Calculus of kidney: Secondary | ICD-10-CM | POA: Diagnosis not present

## 2023-02-13 LAB — CBC WITH DIFFERENTIAL/PLATELET
Abs Immature Granulocytes: 0.03 10*3/uL (ref 0.00–0.07)
Basophils Absolute: 0 10*3/uL (ref 0.0–0.1)
Basophils Relative: 0 %
Eosinophils Absolute: 0 10*3/uL (ref 0.0–0.5)
Eosinophils Relative: 0 %
HCT: 44.7 % (ref 39.0–52.0)
Hemoglobin: 15.7 g/dL (ref 13.0–17.0)
Immature Granulocytes: 0 %
Lymphocytes Relative: 25 %
Lymphs Abs: 1.9 10*3/uL (ref 0.7–4.0)
MCH: 31.2 pg (ref 26.0–34.0)
MCHC: 35.1 g/dL (ref 30.0–36.0)
MCV: 88.9 fL (ref 80.0–100.0)
Monocytes Absolute: 0.4 10*3/uL (ref 0.1–1.0)
Monocytes Relative: 5 %
Neutro Abs: 5.2 10*3/uL (ref 1.7–7.7)
Neutrophils Relative %: 70 %
Platelets: 185 10*3/uL (ref 150–400)
RBC: 5.03 MIL/uL (ref 4.22–5.81)
RDW: 12.9 % (ref 11.5–15.5)
WBC: 7.5 10*3/uL (ref 4.0–10.5)
nRBC: 0 % (ref 0.0–0.2)

## 2023-02-13 LAB — URINALYSIS, ROUTINE W REFLEX MICROSCOPIC
Bilirubin Urine: NEGATIVE
Glucose, UA: NEGATIVE mg/dL
Hgb urine dipstick: NEGATIVE
Ketones, ur: 20 mg/dL — AB
Leukocytes,Ua: NEGATIVE
Nitrite: NEGATIVE
Protein, ur: NEGATIVE mg/dL
Specific Gravity, Urine: 1.019 (ref 1.005–1.030)
pH: 6 (ref 5.0–8.0)

## 2023-02-13 LAB — COMPREHENSIVE METABOLIC PANEL
ALT: 22 U/L (ref 0–44)
AST: 32 U/L (ref 15–41)
Albumin: 4.1 g/dL (ref 3.5–5.0)
Alkaline Phosphatase: 56 U/L (ref 38–126)
Anion gap: 13 (ref 5–15)
BUN: 8 mg/dL (ref 6–20)
CO2: 22 mmol/L (ref 22–32)
Calcium: 9.5 mg/dL (ref 8.9–10.3)
Chloride: 104 mmol/L (ref 98–111)
Creatinine, Ser: 1.35 mg/dL — ABNORMAL HIGH (ref 0.61–1.24)
GFR, Estimated: >60 mL/min (ref >60)
Glucose, Bld: 106 mg/dL — ABNORMAL HIGH (ref 70–99)
Potassium: 3.7 mmol/L (ref 3.5–5.1)
Sodium: 139 mmol/L (ref 135–145)
Total Bilirubin: 1.2 mg/dL (ref 0.3–1.2)
Total Protein: 6.7 g/dL (ref 6.5–8.1)

## 2023-02-13 LAB — LIPASE, BLOOD: Lipase: 30 U/L (ref 11–51)

## 2023-02-13 MED ORDER — ONDANSETRON HCL 4 MG/2ML IJ SOLN: 4.0000 mg | Freq: Once | INTRAMUSCULAR | Status: DC

## 2023-02-13 MED ORDER — ALUM & MAG HYDROXIDE-SIMETH 200-200-20 MG/5ML PO SUSP
30.0000 mL | Freq: Once | ORAL | Status: AC
  Administered 2023-02-13: 30 mL via ORAL
  Filled 2023-02-13: qty 30

## 2023-02-13 MED ORDER — SODIUM CHLORIDE 0.9 % IV BOLUS
1000.0000 mL | Freq: Once | INTRAVENOUS | Status: AC
  Administered 2023-02-13: 1000 mL via INTRAVENOUS

## 2023-02-13 MED ORDER — ONDANSETRON 4 MG PO TBDP
4.0000 mg | ORAL_TABLET | Freq: Once | ORAL | Status: AC
  Administered 2023-02-13: 4 mg via ORAL
  Filled 2023-02-13: qty 1

## 2023-02-13 MED ORDER — HYDROCODONE-ACETAMINOPHEN 5-325 MG PO TABS
1.0000 | ORAL_TABLET | Freq: Once | ORAL | Status: AC
  Administered 2023-02-13: 1 via ORAL
  Filled 2023-02-13: qty 1

## 2023-02-13 MED ORDER — IOHEXOL 350 MG/ML SOLN
75.0000 mL | Freq: Once | INTRAVENOUS | Status: AC | PRN
  Administered 2023-02-13: 75 mL via INTRAVENOUS

## 2023-02-13 MED ORDER — FAMOTIDINE IN NACL 20-0.9 MG/50ML-% IV SOLN
20.0000 mg | Freq: Once | INTRAVENOUS | Status: AC
  Administered 2023-02-13: 20 mg via INTRAVENOUS
  Filled 2023-02-13: qty 50

## 2023-02-13 MED ORDER — ONDANSETRON 4 MG PO TBDP: 4.0000 mg | ORAL_TABLET | Freq: Three times a day (TID) | ORAL | 0 refills | Status: DC | PRN

## 2023-02-13 NOTE — ED Provider Triage Note (Signed)
Emergency Medicine Provider Triage Evaluation Note  Troy [name] , a 52 y.o. male  was evaluated in triage.  Pt complains of abdominal pain and N/V. Symptoms ongoing x 3 days. He associates his symptoms with a known hiatal hernia. Vomiting has been "nonstop". No frank hematemesis, melena, hematochezia, fever. Hx of gastric sleeve.  Review of Systems  Positive: As above Negative: As above  Physical Exam  BP 133/76 (BP Location: Right Arm)   Pulse 61   Temp 97.8 F (36.6 C) (Oral)   Resp 20   SpO2 100%  Gen:   Awake, no distress. Intermittent heaves, vomiting. Resp:  Normal effort  MSK:   Moves extremities without difficulty  Other:  Bowel sounds normal. Abdomen nondistended. Upper abdominal TTP. No peritoneal signs.   Medical Decision Making  Medically screening exam initiated at 5:19 AM.  Appropriate orders placed.  Troy [name] was informed that the remainder of the evaluation will be completed by another provider, this initial triage assessment does not replace that evaluation, and the importance of remaining in the ED until their evaluation is complete.  Upper abdominal pain - reports hx of hiatal hernia, known gastric sleeve. Pending labs and abdominal CT.   Antony Madura, PA-C 02/13/23 [phone]

## 2023-02-13 NOTE — ED Triage Notes (Signed)
Patient c/o abd. Pain with decreased appetite x 2 days c/o vomitng 3-4 days , states zofran helps however he doesn't have any. States he has a hiatal hernia.

## 2023-02-13 NOTE — ED Provider Notes (Signed)
[place] EMERGENCY DEPARTMENT AT [facility] Provider Note   CSN: [id number] Arrival date & time: 02/13/23  0447     History  Chief Complaint  Patient presents with   Abdominal Pain    Troy [name] is a 52 y.o. male with a PMHx of HIV, GERD, who presents to the ED with concerns for upper abdominal pain x 3 days. Notes history of similar symptoms. Has associated nausea, vomiting. He notes that he last had emesis today while in the ED. Has a GI specialist with a colonoscopy to be completed at the end of this month. No meds tried PTA. Denies constipation, diarrhea, urinary symptoms, hematemesis, melena, hematochezia, fever.   The history is provided by the patient. No language interpreter was used.       Home Medications Prior to Admission medications   Medication Sig Start Date End Date Taking? Authorizing Provider  albuterol (VENTOLIN HFA) 108 (90 Base) MCG/ACT inhaler Inhale 2 puffs into the lungs every 6 (six) hours as needed for wheezing or shortness of breath. 07/20/22   Caro Laroche, DO  ARIPiprazole (ABILIFY) 15 MG tablet Take 1 tablet (15 mg total) by mouth daily. 01/30/23 04/30/23  [name], MD  atorvastatin (LIPITOR) 20 MG tablet Take 1 tablet (20 mg total) by mouth daily. 01/23/23   Randall Hiss, MD  beclomethasone (QVAR) 80 MCG/ACT inhaler Inhale 1 puff into the lungs 2 (two) times daily. 07/20/22   Caro Laroche, DO  bictegravir-emtricitabine-tenofovir AF (BIKTARVY) 50-200-25 MG TABS tablet Take 1 tablet by mouth daily. 01/23/23   Randall Hiss, MD  Budeson-Glycopyrrol-Formoterol (BREZTRI AEROSPHERE) 160-9-4.8 MCG/ACT AERO Inhale 2 puffs into the lungs 2 (two) times daily. 09/29/22   Jacky Kindle, FNP  cetirizine (ZYRTEC) 10 MG tablet Take 1 tablet (10 mg total) by mouth daily. 02/03/22   Bacigalupo, Marzella Schlein, MD  eszopiclone (LUNESTA) 2 MG TABS tablet Take 1 tablet (2 mg total) by mouth at bedtime as needed for sleep. Take immediately before  bedtime 11/03/22 12/03/22  [name], MD  eszopiclone 3 MG TABS Take 1 tablet (3 mg total) by mouth at bedtime. Take immediately before bedtime 01/23/23 03/24/23  [name], MD  fluticasone (FLONASE) 50 MCG/ACT nasal spray Place 2 sprays into both nostrils daily. 10/06/22   Erasmo Downer, MD  montelukast (SINGULAIR) 10 MG tablet TAKE 1 TABLET BY MOUTH EVERY DAY 12/19/22   Erasmo Downer, MD  omeprazole (PRILOSEC) 40 MG capsule TAKE 1 CAPSULE BY MOUTH EVERY DAY BEFORE BREAKFAST 08/29/22   Vanga, Loel Dubonnet, MD  ondansetron (ZOFRAN ODT) 4 MG disintegrating tablet Take 1 tablet (4 mg total) by mouth every 8 (eight) hours as needed. 02/13/23   Macarena Langseth A, PA-C  prazosin (MINIPRESS) 1 MG capsule Take 1 capsule (1 mg total) by mouth at bedtime. 01/23/23 03/24/23  [name], MD  sertraline (ZOLOFT) 100 MG tablet Take 1 tablet (100 mg total) by mouth at bedtime. 03/09/23 06/07/23  [name], MD  SUMAtriptan (IMITREX) 100 MG tablet TAKE ONE TABLET BY MOUTH AT ONSET OF THE HEADACHE. MAY REPEAT IN TWO HOURS 02/03/22   Erasmo Downer, MD  tiZANidine (ZANAFLEX) 4 MG tablet TAKE 1 CAPSULE (4 MG TOTAL) BY MOUTH 2 (TWO) TIMES DAILY. 08/22/22   Erasmo Downer, MD  topiramate (TOPAMAX) 100 MG tablet TAKE 1 TABLET BY MOUTH TWICE A DAY 12/19/22   Bacigalupo, Marzella Schlein, MD      Allergies    Niacin,  Orange oil, and Zolpidem    Review of Systems   Review of Systems  Gastrointestinal:  Positive for abdominal pain.  All other systems reviewed and are negative.   Physical Exam Updated Vital Signs BP 133/76 (BP Location: Right Arm)   Pulse 61   Temp 97.8 F (36.6 C) (Oral)   Resp 20   Ht 6' (1.829 m)   Wt 68 kg   SpO2 100%   BMI 20.34 kg/m  Physical Exam Vitals and nursing note reviewed.  Constitutional:      General: He is not in acute distress.    Appearance: Normal appearance.  Eyes:     General: No scleral icterus.    Extraocular Movements: Extraocular movements  intact.  Cardiovascular:     Rate and Rhythm: Normal rate.  Pulmonary:     Effort: Pulmonary effort is normal. No respiratory distress.  Abdominal:     Palpations: Abdomen is soft. There is no mass.     Tenderness: There is abdominal tenderness in the epigastric area.     Comments: Upper abdominal TTP.   Musculoskeletal:        General: Normal range of motion.     Cervical back: Neck supple.  Skin:    General: Skin is warm and dry.     Findings: No rash.  Neurological:     Mental Status: He is alert.     Sensory: Sensation is intact.     Motor: Motor function is intact.  Psychiatric:        Behavior: Behavior normal.    ED Results / Procedures / Treatments   Labs (all labs ordered are listed, but only abnormal results are displayed) Labs Reviewed  COMPREHENSIVE METABOLIC PANEL - Abnormal; Notable for the following components:      Result Value   Glucose, Bld 106 (*)    Creatinine, Ser 1.35 (*)    All other components within normal limits  URINALYSIS, ROUTINE W REFLEX MICROSCOPIC - Abnormal; Notable for the following components:   APPearance HAZY (*)    Ketones, ur 20 (*)    All other components within normal limits  CBC WITH DIFFERENTIAL/PLATELET  LIPASE, BLOOD    EKG None  Radiology CT ABDOMEN PELVIS W CONTRAST  Result Date: 02/13/2023 CLINICAL DATA:  Abdominal pain and vomiting. EXAM: CT ABDOMEN AND PELVIS WITH CONTRAST TECHNIQUE: Multidetector CT imaging of the abdomen and pelvis was performed using the standard protocol following bolus administration of intravenous contrast. RADIATION DOSE REDUCTION: This exam was performed according to the departmental dose-optimization program which includes automated exposure control, adjustment of the mA and/or kV according to patient size and/or use of iterative reconstruction technique. CONTRAST:  75mL OMNIPAQUE IOHEXOL 350 MG/ML SOLN COMPARISON:  03/06/2019 FINDINGS: Lower chest: 4 mm right lower lobe pulmonary nodule on image  5/series 5 is in a region of lung not included on the prior study. 5 mm subpleural left lower lobe nodule is stable in the interval consistent with benign etiology such as lymph node. Hepatobiliary: No suspicious focal abnormality within the liver parenchyma. There is no evidence for gallstones, gallbladder wall thickening, or pericholecystic fluid. No intrahepatic or extrahepatic biliary dilation. Pancreas: No focal mass lesion. No dilatation of the main duct. No intraparenchymal cyst. No peripancreatic edema. Spleen: No splenomegaly. No focal mass lesion. Adrenals/Urinary Tract: No adrenal nodule or mass. 2 mm nonobstructing stone identified upper pole right kidney. 2 x 5 mm nonobstructing stone identified lower pole left kidney. No evidence for hydroureter. The urinary  bladder appears normal for the degree of distention. Stomach/Bowel: Surgical changes in the stomach compatible with gastric bypass. No small bowel wall thickening. No small bowel dilatation. No gross colonic mass. No colonic wall thickening. Vascular/Lymphatic: No abdominal aortic aneurysm. No abdominal aortic atherosclerotic calcification. There is no gastrohepatic or hepatoduodenal ligament lymphadenopathy. No retroperitoneal or mesenteric lymphadenopathy. No pelvic sidewall lymphadenopathy. Reproductive: The prostate gland and seminal vesicles are unremarkable. Other: Trace free fluid is noted in the pelvis. Musculoskeletal: No worrisome lytic or sclerotic osseous abnormality. IMPRESSION: 1. No acute findings in the abdomen or pelvis. Specifically, no evidence for bowel obstruction. No small bowel or colonic wall thickening to suggest inflammatory disease. 2. Trace free fluid in the pelvis. Although this is considered abnormal in a male patient, etiology for this trace fluid is not evident on the current exam. 3. Bilateral nonobstructing renal stones. 4. 4 mm right lower lobe pulmonary nodule. No follow-up needed if patient is low-risk.This  recommendation follows the consensus statement: Guidelines for Management of Incidental Pulmonary Nodules Detected on CT Images: From the Fleischner Society 2017; Radiology 2017; 284:228-243. Electronically Signed   By: Kennith Center M.D.   On: 02/13/2023 06:56    Procedures Procedures    Medications Ordered in ED Medications  ondansetron (ZOFRAN-ODT) disintegrating tablet 4 mg (4 mg Oral Given 02/13/23 0532)  HYDROcodone-acetaminophen (NORCO/VICODIN) 5-325 MG per tablet 1 tablet (1 tablet Oral Given 02/13/23 0531)  iohexol (OMNIPAQUE) 350 MG/ML injection 75 mL (75 mLs Intravenous Contrast Given 02/13/23 0636)  sodium chloride 0.9 % bolus 1,000 mL (0 mLs Intravenous Stopped 02/13/23 1005)  famotidine (PEPCID) IVPB 20 mg premix (0 mg Intravenous Stopped 02/13/23 1003)  alum & mag hydroxide-simeth (MAALOX/MYLANTA) 200-200-20 MG/5ML suspension 30 mL (30 mLs Oral Given 02/13/23 0843)    ED Course/ Medical Decision Making/ A&P Clinical Course as of 02/13/23 1054  Mon Feb 13, 2023  1023 Pt re-evaluated and asleep on stretcher. [SB]  1050 Re-evaluated and patient asleep on stretcher. Pt noted improvement of symptoms with treatment regimen. Discussed discharge treatment plan. Pt agreeable at this time. Pt appears safe for discharge. [SB]    Clinical Course User Index [SB] Natonya Finstad A, PA-C                             Medical Decision Making Risk OTC drugs. Prescription drug management.   Patient presents to the emergency department with upper abdominal pain x 3 days. Has a history of a hiatal hernia. Has a GI specialist days. On exam, patient with upper abdominal TTP. Reminder of exam without acute findings. Pt afebrile. Differential diagnosis includes pancreatitis, cholecystitis, appendicitis, diverticulitis, GERD.    Labs:  I ordered, and personally interpreted labs.  The pertinent results include:  CBC unremarkable Lipase unremarkable Urinalysis unremarkable CMP with slightly  elevated creatinine at 1.35 otherwise unremarkable  Imaging: I ordered imaging studies including CT abdomen pelvis with I independently visualized and interpreted imaging which showed  1. No acute findings in the abdomen or pelvis. Specifically, no  evidence for bowel obstruction. No small bowel or colonic wall  thickening to suggest inflammatory disease.  2. Trace free fluid in the pelvis. Although this is considered  abnormal in a male patient, etiology for this trace fluid is not  evident on the current exam.  3. Bilateral nonobstructing renal stones.  4. 4 mm right lower lobe pulmonary nodule.   I agree with the radiologist interpretation  Medications:  MSE  provider ordered zofran and hydrocodone. I ordered medication including IVF, pepcid, GI cockatil for symptom management. Reevaluation of the patient after these medicines and interventions, I reevaluated the patient and found that they have improved I have reviewed the patients home medicines and have made adjustments as needed   Disposition: Please stay suspicious for upper abdominal pain, likely GERD related.  Doubt concerns at this time for pancreatitis, diverticulitis, cholecystitis. After consideration of the diagnostic results and the patients response to treatment, I feel that the patient would benefit from Discharge home.  Patient sent a prescription for Zofran.  Instructed patient to follow-up with his GI specialist regarding today's ED visit as well as incidental findings on CT scan. Supportive care measures and strict return precautions discussed with patient at bedside. Pt acknowledges and verbalizes understanding. Pt appears safe for discharge. Follow up as indicated in discharge paperwork.   This chart was dictated using voice recognition software, Dragon. Despite the best efforts of this provider to proofread and correct errors, errors may still occur which can change documentation meaning.   Final Clinical  Impression(s) / ED Diagnoses Final diagnoses:  Upper abdominal pain    Rx / DC Orders ED Discharge Orders          Ordered    ondansetron (ZOFRAN ODT) 4 MG disintegrating tablet  Every 8 hours PRN        02/13/23 1049              Shahara Hartsfield A, PA-C 02/13/23 1527    Benjiman Core, MD 02/14/23 [phone]

## 2023-02-13 NOTE — Discharge Instructions (Signed)
It was a pleasure take care of you today!  There were no concerning emergent findings noted on your lab or imaging studies today.  There were some incidental findings that can be followed up with your GI specialist.  It is important that you call them today to set up a follow-up appointment regarding today's ED visit.  Continue to take your prescription medications as prescribed to you.  You will also be sent a prescription for Zofran, take as directed.  Take the Zofran if you are experiencing nausea or vomiting.  If you take the medication wait at least 30 minutes before you attempt small sips/small bites of food/fluid.  Ensure to maintain fluid intake with water, tea, broth, soup, Pedialyte, Gatorade.  Return to the emergency department for experience increasing/worsening symptoms.

## 2023-02-15 ENCOUNTER — Telehealth: Payer: Medicaid Other | Admitting: Physician Assistant

## 2023-02-15 DIAGNOSIS — R112 Nausea with vomiting, unspecified: Secondary | ICD-10-CM | POA: Diagnosis not present

## 2023-02-15 DIAGNOSIS — K449 Diaphragmatic hernia without obstruction or gangrene: Secondary | ICD-10-CM | POA: Diagnosis not present

## 2023-02-15 DIAGNOSIS — R634 Abnormal weight loss: Secondary | ICD-10-CM | POA: Diagnosis not present

## 2023-02-15 MED ORDER — OMEPRAZOLE 40 MG PO CPDR: 40.0000 mg | DELAYED_RELEASE_CAPSULE | Freq: Two times a day (BID) | ORAL | 0 refills | Status: DC

## 2023-02-15 MED ORDER — ONDANSETRON 4 MG PO TBDP
4.0000 mg | ORAL_TABLET | Freq: Three times a day (TID) | ORAL | 0 refills | Status: AC | PRN
Start: 2023-02-15 — End: ?

## 2023-02-15 NOTE — Progress Notes (Signed)
Virtual Visit Consent   Troy [name], you are scheduled for a virtual visit with a [facility] provider today. Just as with appointments in the office, your consent must be obtained to participate. Your consent will be active for this visit and any virtual visit you may have with one of our providers in the next 365 days. If you have a MyChart account, a copy of this consent can be sent to you electronically.  As this is a virtual visit, video technology does not allow for your provider to perform a traditional examination. This may limit your provider's ability to fully assess your condition. If your provider identifies any concerns that need to be evaluated in person or the need to arrange testing (such as labs, EKG, etc.), we will make arrangements to do so. Although advances in technology are sophisticated, we cannot ensure that it will always work on either your end or our end. If the connection with a video visit is poor, the visit may have to be switched to a telephone visit. With either a video or telephone visit, we are not always able to ensure that we have a secure connection.  By engaging in this virtual visit, you consent to the provision of healthcare and authorize for your insurance to be billed (if applicable) for the services provided during this visit. Depending on your insurance coverage, you may receive a charge related to this service.  I need to obtain your verbal consent now. Are you willing to proceed with your visit today? [name] has provided verbal consent on 02/15/2023 for a virtual visit (video or telephone). Margaretann Loveless, PA-C  Date: 02/15/2023 10:07 AM  Virtual Visit via Video Note   I, Margaretann Loveless, connected with  Troy [name]  ([id number], May 25, 1971) on 02/15/23 at 10:00 AM EDT by a video-enabled telemedicine application and verified that I am speaking with the correct person using two identifiers.  Location: Patient: Virtual Visit Location Patient:  Home Provider: Virtual Visit Location Provider: Home Office   I discussed the limitations of evaluation and management by telemedicine and the availability of in person appointments. The patient expressed understanding and agreed to proceed.    History of Present Illness: Troy [name] is a 52 y.o. who identifies as a male who was assigned male at birth, and is being seen today for continued nausea and vomiting. He was seen at the ER on 02/13/23 and was found to have no significant source for nausea and vomiting. He does have a known Hiatal hernia that he blames for the cause of nausea and vomiting. Reports he had a similar occurrence in Dec 2023. He had to use Zofran daily for about 30 days, but symptoms improved until most recent flare. He has been seen by GI and is to have a scheduled colonoscopy soon. Had been scheduled for 02/07/23 but had been canceled. Reports it is rescheduled for end of May 2024. EGD was done on 11/23/22 and other than a moderate sized hiatal hernia was unremarkable. He has history of sleeve gastrectomy for weight loss. He does report that he has lost about 10 pounds with this current episode. He is requesting a refill of Zofran to hold him over until he sees GI again next month. Also requesting a refill of his omeprazole for the increased dose of  BID, just increased at the ER.   Problems:  Patient Active Problem List   Diagnosis Date Noted   Nausea and vomiting 11/23/2022   Hiatal hernia  11/23/2022   Eustachian tube disorder, right 10/06/2022   Mild persistent asthma with exacerbation 09/29/2022   Unsheltered homelessness 09/29/2022   Financial difficulties 09/26/2022   Hyperlipidemia 08/22/2022   Syphilis 08/10/2022   Open wound of left buttock 06/16/2022   Vaccine counseling 12/29/2021   Folliculitis barbae 09/01/2021   Pain with ejaculation 10/05/2020   Chronic bilateral low back pain without sciatica 10/05/2020   Lower urinary tract symptoms (LUTS)  11/26/2019   Nephrolithiasis 06/14/2019   Allergic rhinitis 02/13/2019   Asthma 02/11/2019   Crohn's disease without complication 02/11/2019   Dyslipidemia 02/11/2019   HIV disease 02/11/2019   Kidney stones, calcium oxalate 02/11/2019   History of CVA with residual deficit    Bipolar 1 disorder    Overweight 01/13/2015   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   S/P bariatric surgery 06/20/2013   Insomnia 12/09/2011   Migraine headache 09/28/2011    Allergies:  Allergies  Allergen Reactions   Niacin Anaphylaxis   Orange Oil Anaphylaxis   Zolpidem     Other reaction(s): Unknown Sleep walking and sleep eating   Medications:  Current Outpatient Medications:    ondansetron (ZOFRAN-ODT) 4 MG disintegrating tablet, Take 1 tablet (4 mg total) by mouth every 8 (eight) hours as needed., Disp: 30 tablet, Rfl: 0   albuterol (VENTOLIN HFA) 108 (90 Base) MCG/ACT inhaler, Inhale 2 puffs into the lungs every 6 (six) hours as needed for wheezing or shortness of breath., Disp: 8 g, Rfl: 2   ARIPiprazole (ABILIFY) 15 MG tablet, Take 1 tablet (15 mg total) by mouth daily., Disp: 90 tablet, Rfl: 0   atorvastatin (LIPITOR) 20 MG tablet, Take 1 tablet (20 mg total) by mouth daily., Disp: 30 tablet, Rfl: 11   beclomethasone (QVAR) 80 MCG/ACT inhaler, Inhale 1 puff into the lungs 2 (two) times daily., Disp: 1 each, Rfl: 2   bictegravir-emtricitabine-tenofovir AF (BIKTARVY) 50-200-25 MG TABS tablet, Take 1 tablet by mouth daily., Disp: 30 tablet, Rfl: 11   Budeson-Glycopyrrol-Formoterol (BREZTRI AEROSPHERE) 160-9-4.8 MCG/ACT AERO, Inhale 2 puffs into the lungs 2 (two) times daily., Disp: 10.7 g, Rfl: 11   cetirizine (ZYRTEC) 10 MG tablet, Take 1 tablet (10 mg total) by mouth daily., Disp: 90 tablet, Rfl: 3   eszopiclone (LUNESTA) 2 MG TABS tablet, Take 1 tablet (2 mg total) by mouth at bedtime as needed for sleep. Take immediately before bedtime, Disp: 30 tablet, Rfl: 0   eszopiclone 3 MG TABS, Take 1  tablet (3 mg total) by mouth at bedtime. Take immediately before bedtime, Disp: 30 tablet, Rfl: 1   fluticasone (FLONASE) 50 MCG/ACT nasal spray, Place 2 sprays into both nostrils daily., Disp: 16 g, Rfl: 6   montelukast (SINGULAIR) 10 MG tablet, TAKE 1 TABLET BY MOUTH EVERY DAY, Disp: 90 tablet, Rfl: 3   omeprazole (PRILOSEC) 40 MG capsule, Take 1 capsule (40 mg total) by mouth in the morning and at bedtime., Disp: 180 capsule, Rfl: 0   prazosin (MINIPRESS) 1 MG capsule, Take 1 capsule (1 mg total) by mouth at bedtime., Disp: 30 capsule, Rfl: 1   [START ON 03/09/2023] sertraline (ZOLOFT) 100 MG tablet, Take 1 tablet (100 mg total) by mouth at bedtime., Disp: 90 tablet, Rfl: 0   SUMAtriptan (IMITREX) 100 MG tablet, TAKE ONE TABLET BY MOUTH AT ONSET OF THE HEADACHE. MAY REPEAT IN TWO HOURS, Disp: 10 tablet, Rfl: 3   tiZANidine (ZANAFLEX) 4 MG tablet, TAKE 1 CAPSULE (4 MG TOTAL) BY MOUTH 2 (TWO) TIMES DAILY., Disp:  180 tablet, Rfl: 1   topiramate (TOPAMAX) 100 MG tablet, TAKE 1 TABLET BY MOUTH TWICE A DAY, Disp: 180 tablet, Rfl: 3  Observations/Objective: Patient is well-developed, well-nourished in no acute distress.  Resting comfortably at home.  Head is normocephalic, atraumatic.  No labored breathing.  Speech is clear and coherent with logical content.  Patient is alert and oriented at baseline.    Assessment and Plan: 1. Nausea and vomiting, unspecified vomiting type - ondansetron (ZOFRAN-ODT) 4 MG disintegrating tablet; Take 1 tablet (4 mg total) by mouth every 8 (eight) hours as needed.  Dispense: 30 tablet; Refill: 0 - omeprazole (PRILOSEC) 40 MG capsule; Take 1 capsule (40 mg total) by mouth in the morning and at bedtime.  Dispense: 180 capsule; Refill: 0  2. Weight loss - ondansetron (ZOFRAN-ODT) 4 MG disintegrating tablet; Take 1 tablet (4 mg total) by mouth every 8 (eight) hours as needed.  Dispense: 30 tablet; Refill: 0 - omeprazole (PRILOSEC) 40 MG capsule; Take 1 capsule (40 mg  total) by mouth in the morning and at bedtime.  Dispense: 180 capsule; Refill: 0  3. Hiatal hernia - omeprazole (PRILOSEC) 40 MG capsule; Take 1 capsule (40 mg total) by mouth in the morning and at bedtime.  Dispense: 180 capsule; Refill: 0  - Zofran refilled - Omeprazole dose increased and refilled to pharmacy - Push fluids, small, frequent meals - Strict ER precautions verbally discussed - Keep scheduled GI follow up  Follow Up Instructions: I discussed the assessment and treatment plan with the patient. The patient was provided an opportunity to ask questions and all were answered. The patient agreed with the plan and demonstrated an understanding of the instructions.  A copy of instructions were sent to the patient via MyChart unless otherwise noted below.    The patient was advised to call back or seek an in-person evaluation if the symptoms worsen or if the condition fails to improve as anticipated.  Time:  I spent 12 minutes with the patient via telehealth technology discussing the above problems/concerns.    Margaretann Loveless, PA-C

## 2023-02-15 NOTE — Patient Instructions (Signed)
Troy [name], thank you for joining Troy Loveless, PA-C for today's virtual visit.  While this provider is not your primary care provider (PCP), if your PCP is located in our provider database this encounter information will be shared with them immediately following your visit.   A [facility] MyChart account gives you access to today's visit and all your visits, tests, and labs performed at [facility] " click here if you don't have a [facility] MyChart account or go to [email or web address]  Consent: (Patient) Troy [name] provided verbal consent for this virtual visit at the beginning of the encounter.  Current Medications:  Current Outpatient Medications:    ondansetron (ZOFRAN-ODT) 4 MG disintegrating tablet, Take 1 tablet (4 mg total) by mouth every 8 (eight) hours as needed., Disp: 30 tablet, Rfl: 0   albuterol (VENTOLIN HFA) 108 (90 Base) MCG/ACT inhaler, Inhale 2 puffs into the lungs every 6 (six) hours as needed for wheezing or shortness of breath., Disp: 8 g, Rfl: 2   ARIPiprazole (ABILIFY) 15 MG tablet, Take 1 tablet (15 mg total) by mouth daily., Disp: 90 tablet, Rfl: 0   atorvastatin (LIPITOR) 20 MG tablet, Take 1 tablet (20 mg total) by mouth daily., Disp: 30 tablet, Rfl: 11   beclomethasone (QVAR) 80 MCG/ACT inhaler, Inhale 1 puff into the lungs 2 (two) times daily., Disp: 1 each, Rfl: 2   bictegravir-emtricitabine-tenofovir AF (BIKTARVY) 50-200-25 MG TABS tablet, Take 1 tablet by mouth daily., Disp: 30 tablet, Rfl: 11   Budeson-Glycopyrrol-Formoterol (BREZTRI AEROSPHERE) 160-9-4.8 MCG/ACT AERO, Inhale 2 puffs into the lungs 2 (two) times daily., Disp: 10.7 g, Rfl: 11   cetirizine (ZYRTEC) 10 MG tablet, Take 1 tablet (10 mg total) by mouth daily., Disp: 90 tablet, Rfl: 3   eszopiclone (LUNESTA) 2 MG TABS tablet, Take 1 tablet (2 mg total) by mouth at bedtime as needed for sleep. Take immediately before bedtime, Disp: 30 tablet, Rfl: 0   eszopiclone 3 MG TABS,  Take 1 tablet (3 mg total) by mouth at bedtime. Take immediately before bedtime, Disp: 30 tablet, Rfl: 1   fluticasone (FLONASE) 50 MCG/ACT nasal spray, Place 2 sprays into both nostrils daily., Disp: 16 g, Rfl: 6   montelukast (SINGULAIR) 10 MG tablet, TAKE 1 TABLET BY MOUTH EVERY DAY, Disp: 90 tablet, Rfl: 3   omeprazole (PRILOSEC) 40 MG capsule, Take 1 capsule (40 mg total) by mouth in the morning and at bedtime., Disp: 180 capsule, Rfl: 0   prazosin (MINIPRESS) 1 MG capsule, Take 1 capsule (1 mg total) by mouth at bedtime., Disp: 30 capsule, Rfl: 1   [START ON 03/09/2023] sertraline (ZOLOFT) 100 MG tablet, Take 1 tablet (100 mg total) by mouth at bedtime., Disp: 90 tablet, Rfl: 0   SUMAtriptan (IMITREX) 100 MG tablet, TAKE ONE TABLET BY MOUTH AT ONSET OF THE HEADACHE. MAY REPEAT IN TWO HOURS, Disp: 10 tablet, Rfl: 3   tiZANidine (ZANAFLEX) 4 MG tablet, TAKE 1 CAPSULE (4 MG TOTAL) BY MOUTH 2 (TWO) TIMES DAILY., Disp: 180 tablet, Rfl: 1   topiramate (TOPAMAX) 100 MG tablet, TAKE 1 TABLET BY MOUTH TWICE A DAY, Disp: 180 tablet, Rfl: 3   Medications ordered in this encounter:  Meds ordered this encounter  Medications   ondansetron (ZOFRAN-ODT) 4 MG disintegrating tablet    Sig: Take 1 tablet (4 mg total) by mouth every 8 (eight) hours as needed.    Dispense:  30 tablet    Refill:  0    Order Specific Question:  Supervising Provider    Answer:   Merrilee Jansky [[id number]]   omeprazole (PRILOSEC) 40 MG capsule    Sig: Take 1 capsule (40 mg total) by mouth in the morning and at bedtime.    Dispense:  180 capsule    Refill:  0    Order Specific Question:   Supervising Provider    Answer:   Merrilee Jansky [id number]     *If you need refills on other medications prior to your next appointment, please contact your pharmacy*  Follow-Up: Call back or seek an in-person evaluation if the symptoms worsen or if the condition fails to improve as anticipated.  [facility] [phone]  Other Instructions Hiatal Hernia  A hiatal hernia occurs when part of the stomach slides above the muscle that separates the abdomen from the chest (diaphragm). A person can be born with a hiatal hernia (congenital), or it may develop over time. In almost all cases of hiatal hernia, only the top part of the stomach pushes through the diaphragm. Many people have a hiatal hernia with no symptoms. The larger the hernia, the more likely it is that you will have symptoms. In some cases, a hiatal hernia allows stomach acid to flow back into the tube that carries food from your mouth to your stomach (esophagus). This may cause heartburn symptoms. The development of heartburn symptoms may mean that you have a condition called gastroesophageal reflux disease (GERD). What are the causes? This condition is caused by a weakness in the opening (hiatus) where the esophagus passes through the diaphragm to attach to the upper part of the stomach. A person may be born with a weakness in the hiatus, or a weakness can develop over time. What increases the risk? This condition is more likely to develop in: Older people. Age is a major risk factor for a hiatal hernia, especially if you are over the age of 70. Pregnant women. People who are overweight. People who have frequent constipation. What are the signs or symptoms? Symptoms of this condition usually develop in the form of GERD symptoms. Symptoms include: Heartburn. Upset stomach (indigestion). Trouble swallowing. Coughing or wheezing. Wheezing is making high-pitched whistling sounds when you breathe. Sore throat. Chest pain. Nausea and vomiting. How is this diagnosed? This condition may be diagnosed during testing for GERD. Tests that may be done include: X-rays of your stomach or chest. An upper gastrointestinal (GI) series. This is an X-ray exam of your GI tract that is taken after you swallow a chalky liquid that shows up clearly on the  X-ray. Endoscopy. This is a procedure to look into your stomach using a thin, flexible tube that has a tiny camera and light on the end of it. How is this treated? This condition may be treated by: Dietary and lifestyle changes to help reduce GERD symptoms. Medicines. These may include: Over-the-counter antacids. Medicines that make your stomach empty more quickly. Medicines that block the production of stomach acid (H2 blockers). Stronger medicines to reduce stomach acid (proton pump inhibitors). Surgery to repair the hernia, if other treatments are not helping. If you have no symptoms, you may not need treatment. Follow these instructions at home: Lifestyle and activity Do not use any products that contain nicotine or tobacco. These products include cigarettes, chewing tobacco, and vaping devices, such as e-cigarettes. If you need help quitting, ask your health care provider. Try to achieve and maintain a healthy body weight. Avoid putting pressure on your abdomen.  Anything that puts pressure on your abdomen increases the amount of acid that may be pushed up into your esophagus. Avoid bending over, especially after eating. Raise the head of your bed by putting blocks under the legs. This keeps your head and esophagus higher than your stomach. Do not wear tight clothing around your chest or stomach. Try not to strain when having a bowel movement, when urinating, or when lifting heavy objects. Eating and drinking Avoid foods that can worsen GERD symptoms. These may include: Fatty foods, like fried foods. Citrus fruits, like oranges or lemon. Other foods and drinks that contain acid, like orange juice or tomatoes. Spicy food. Chocolate. Eat frequent small meals instead of three large meals a day. This helps prevent your stomach from getting too full. Eat slowly. Do not lie down right after eating. Do not eat 1-2 hours before bed. Do not drink beverages with caffeine. These include  cola, coffee, cocoa, and tea. Do not drink alcohol. General instructions Take over-the-counter and prescription medicines only as told by your health care provider. Keep all follow-up visits. Your health care provider will want to check that any new prescribed medicines are helping your symptoms. Contact a health care provider if: Your symptoms are not controlled with medicines or lifestyle changes. You are having trouble swallowing. You have coughing or wheezing that will not go away. Your pain is getting worse. Your pain spreads to your arms, neck, jaw, teeth, or back. You feel nauseous or you vomit. Get help right away if: You have shortness of breath. You vomit blood. You have bright red blood in your stools. You have black, tarry stools. These symptoms may be an emergency. Get help right away. Call 911. Do not wait to see if the symptoms will go away. Do not drive yourself to the hospital. Summary A hiatal hernia occurs when part of the stomach slides above the muscle that separates the abdomen from the chest. A person may be born with a weakness in the hiatus, or a weakness can develop over time. Symptoms of a hiatal hernia may include heartburn, trouble swallowing, or sore throat. Management of a hiatal hernia includes eating frequent small meals instead of three large meals a day. Get help right away if you vomit blood, have bright red blood in your stools, or have black, tarry stools. This information is not intended to replace advice given to you by your health care provider. Make sure you discuss any questions you have with your health care provider. Document Revised: 12/07/2021 Document Reviewed: 12/07/2021 Elsevier Patient Education  2023 Elsevier Inc.    If you have been instructed to have an in-person evaluation today at a local Urgent Care facility, please use the link below. It will take you to a list of all of our available [facility], including  address, phone number and hours of operation. Please do not delay care.  [facility]  If you or a family member do not have a primary care provider, use the link below to schedule a visit and establish care. When you choose a [facility] primary care physician or advanced practice provider, you gain a long-term partner in health. Find a Primary Care Provider  Learn more about [facility]'s in-office and virtual care options:  - Get Care Now

## 2023-02-16 ENCOUNTER — Other Ambulatory Visit: Payer: Self-pay | Admitting: Psychiatry

## 2023-02-16 ENCOUNTER — Other Ambulatory Visit: Payer: Self-pay | Admitting: Family Medicine

## 2023-02-16 NOTE — Telephone Encounter (Signed)
Requested medications are due for refill today.  Provider to determine  Requested medications are on the active medications list.  yes  Last refill. 08/22/2022 #180 1 rf  Future visit scheduled.   no  Notes to clinic.  Refill not delegated.    Requested Prescriptions  Pending Prescriptions Disp Refills   tiZANidine (ZANAFLEX) 4 MG tablet [Pharmacy Med Name: TIZANIDINE HCL 4 MG TABLET] 180 tablet 2    Sig: TAKE 1 CAPSULE (4 MG TOTAL) BY MOUTH 2 (TWO) TIMES DAILY.     Not Delegated - Cardiovascular:  Alpha-2 Agonists - tizanidine Failed - 02/16/2023 11:31 AM      Failed - This refill cannot be delegated      Passed - Valid encounter within last 6 months    Recent Outpatient Visits           4 months ago Eustachian tube disorder, right   [facility] [place], Marzella Schlein, MD   4 months ago Non-recurrent acute suppurative otitis media of right ear with spontaneous rupture of tympanic membrane   [facility] Merita Norton T, FNP   4 months ago HIV disease [facility])   [facility] [place] Dense M, DO   7 months ago Mild intermittent asthma without complication   [facility] [place] M, DO   7 months ago Need for prophylactic vaccination and inoculation against influenza   [facility] [place], [place], [place]

## 2023-02-22 DIAGNOSIS — Z419 Encounter for procedure for purposes other than remedying health state, unspecified: Secondary | ICD-10-CM | POA: Diagnosis not present

## 2023-03-06 ENCOUNTER — Telehealth: Payer: Self-pay

## 2023-03-06 ENCOUNTER — Other Ambulatory Visit: Payer: Self-pay

## 2023-03-06 DIAGNOSIS — R634 Abnormal weight loss: Secondary | ICD-10-CM

## 2023-03-06 DIAGNOSIS — R1013 Epigastric pain: Secondary | ICD-10-CM

## 2023-03-06 DIAGNOSIS — Z1211 Encounter for screening for malignant neoplasm of colon: Secondary | ICD-10-CM

## 2023-03-06 DIAGNOSIS — R112 Nausea with vomiting, unspecified: Secondary | ICD-10-CM

## 2023-03-06 MED ORDER — NA SULFATE-K SULFATE-MG SULF 17.5-3.13-1.6 GM/177ML PO SOLN: 354.0000 mL | Freq: Once | ORAL | 0 refills | Status: AC

## 2023-03-06 NOTE — Telephone Encounter (Signed)
[employer] [place] a fax states patient is receiving omeprazole 40mg . States your patient has been on a proton pump inhibitor for extended duration. The log term use of this medication can increase risk for fractures, mineral, and vitamin malabsorption, pneumonia, and clostridium difficile infection   Recommendation Please evaluate your patient medication therapy to see if this medication is still needed. Consider managing occasional symptoms with over the counter antacids( Tums, Rolaids) or H2 receptor blockers (eg famotidine)

## 2023-03-06 NOTE — Addendum Note (Signed)
Addended by: Radene Knee L on: 03/06/2023 11:29 AM   Modules accepted: Orders

## 2023-03-06 NOTE — Telephone Encounter (Signed)
His PCP prescribed omeprazole 40 mg p.o. twice daily due to persistent nausea and vomiting.  He does not have follow-up with me.  He can follow-up with our PA, Inetta Fermo based on the gastric emptying study results Recommend gastric emptying study to rule out gastroparesis.  If this is negative, please refer him to general surgery to evaluate for hiatal hernia repair Dx: Medium size hiatal hernia, on long-term high-dose PPI I do not see his colonoscopy rescheduled as well  Lannette Donath, MD

## 2023-03-06 NOTE — Telephone Encounter (Signed)
Patient verbalized understanding of instructions. He is okay with Gastric emptying study and rescheduling the colonoscopy. Scheduled the colonoscopy for 03/22/2023. Called and got gastric emptying study schedule for 03/24/2023 [place] 8[place]00 for a 8:30 scan. Nothing to eat or drink after midnight. No GI medication after midnight. He verbalized understanding of instructions. Patient made follow up appointment with tina. Went over instructions, mailed them and sent to [employer]
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
## 2023-03-21 ENCOUNTER — Encounter: Payer: Self-pay | Admitting: Gastroenterology

## 2023-03-21 ENCOUNTER — Encounter: Payer: Self-pay | Admitting: Psychiatry

## 2023-03-21 ENCOUNTER — Telehealth (INDEPENDENT_AMBULATORY_CARE_PROVIDER_SITE_OTHER): Payer: Medicaid Other | Admitting: Psychiatry

## 2023-03-21 DIAGNOSIS — F4310 Post-traumatic stress disorder, unspecified: Secondary | ICD-10-CM

## 2023-03-21 DIAGNOSIS — F1091 Alcohol use, unspecified, in remission: Secondary | ICD-10-CM | POA: Diagnosis not present

## 2023-03-21 DIAGNOSIS — G4700 Insomnia, unspecified: Secondary | ICD-10-CM | POA: Diagnosis not present

## 2023-03-21 DIAGNOSIS — F3132 Bipolar disorder, current episode depressed, moderate: Secondary | ICD-10-CM

## 2023-03-21 MED ORDER — PRAZOSIN HCL 1 MG PO CAPS: 1.0000 mg | ORAL_CAPSULE | Freq: Every day | ORAL | 0 refills | Status: DC

## 2023-03-21 MED ORDER — ESZOPICLONE 3 MG PO TABS: 3.0000 mg | ORAL_TABLET | Freq: Every day | ORAL | 0 refills | Status: DC

## 2023-03-22 ENCOUNTER — Encounter
Admission: RE | Disposition: A | Discharge status: Home / Self Care | Payer: Self-pay | Source: Home / Self Care | Attending: Gastroenterology

## 2023-03-22 ENCOUNTER — Telehealth: Payer: Self-pay

## 2023-03-22 ENCOUNTER — Other Ambulatory Visit: Payer: Self-pay

## 2023-03-22 ENCOUNTER — Encounter: Payer: Self-pay | Admitting: Gastroenterology

## 2023-03-22 ENCOUNTER — Ambulatory Visit
Admission: RE | Admit: 2023-03-22 | Discharge: 2023-03-22 | Disposition: A | Discharge status: Home / Self Care | Payer: Medicaid Other | Attending: Gastroenterology | Admitting: Gastroenterology

## 2023-03-22 ENCOUNTER — Ambulatory Visit: Payer: Medicaid Other | Admitting: Anesthesiology

## 2023-03-22 DIAGNOSIS — Z5986 Financial insecurity: Secondary | ICD-10-CM | POA: Insufficient documentation

## 2023-03-22 DIAGNOSIS — Z538 Procedure and treatment not carried out for other reasons: Secondary | ICD-10-CM | POA: Diagnosis not present

## 2023-03-22 DIAGNOSIS — E119 Type 2 diabetes mellitus without complications: Secondary | ICD-10-CM | POA: Diagnosis not present

## 2023-03-22 DIAGNOSIS — Z1211 Encounter for screening for malignant neoplasm of colon: Secondary | ICD-10-CM

## 2023-03-22 DIAGNOSIS — J45909 Unspecified asthma, uncomplicated: Secondary | ICD-10-CM | POA: Diagnosis not present

## 2023-03-22 DIAGNOSIS — F319 Bipolar disorder, unspecified: Secondary | ICD-10-CM | POA: Diagnosis not present

## 2023-03-22 DIAGNOSIS — Z833 Family history of diabetes mellitus: Secondary | ICD-10-CM | POA: Diagnosis not present

## 2023-03-22 DIAGNOSIS — K219 Gastro-esophageal reflux disease without esophagitis: Secondary | ICD-10-CM | POA: Insufficient documentation

## 2023-03-22 DIAGNOSIS — G709 Myoneural disorder, unspecified: Secondary | ICD-10-CM | POA: Diagnosis not present

## 2023-03-22 DIAGNOSIS — K5641 Fecal impaction: Secondary | ICD-10-CM | POA: Diagnosis not present

## 2023-03-22 DIAGNOSIS — K449 Diaphragmatic hernia without obstruction or gangrene: Secondary | ICD-10-CM | POA: Diagnosis not present

## 2023-03-22 DIAGNOSIS — E785 Hyperlipidemia, unspecified: Secondary | ICD-10-CM | POA: Diagnosis not present

## 2023-03-22 HISTORY — PX: COLONOSCOPY WITH PROPOFOL: SHX5780

## 2023-03-22 SURGERY — COLONOSCOPY WITH PROPOFOL
Anesthesia: General

## 2023-03-22 MED ORDER — GLYCOPYRROLATE 0.2 MG/ML IJ SOLN
INTRAMUSCULAR | Status: DC | PRN
  Administered 2023-03-22: .2 mg via INTRAVENOUS

## 2023-03-22 MED ORDER — PROPOFOL 500 MG/50ML IV EMUL
INTRAVENOUS | Status: DC | PRN
  Administered 2023-03-22: 50 ug/kg/min via INTRAVENOUS

## 2023-03-22 MED ORDER — LIDOCAINE HCL (CARDIAC) PF 100 MG/5ML IV SOSY
PREFILLED_SYRINGE | INTRAVENOUS | Status: DC | PRN
  Administered 2023-03-22: 60 mg via INTRAVENOUS

## 2023-03-22 MED ORDER — GLYCOPYRROLATE 0.2 MG/ML IJ SOLN
INTRAMUSCULAR | Status: AC
  Filled 2023-03-22: qty 1

## 2023-03-22 MED ORDER — PROPOFOL 10 MG/ML IV BOLUS
INTRAVENOUS | Status: DC | PRN
  Administered 2023-03-22: 50 mg via INTRAVENOUS

## 2023-03-22 MED ORDER — LIDOCAINE HCL (PF) 2 % IJ SOLN
INTRAMUSCULAR | Status: AC
  Filled 2023-03-22: qty 5

## 2023-03-22 MED ORDER — SODIUM CHLORIDE 0.9 % IV SOLN: INTRAVENOUS | Status: DC

## 2023-03-22 NOTE — Anesthesia Preprocedure Evaluation (Signed)
Anesthesia Evaluation  Patient identified by MRN, date of birth, ID band Patient awake    Reviewed: Allergy & Precautions, H&P , NPO status , Patient's Chart, lab work & pertinent test results, reviewed documented beta blocker date and time   Airway Mallampati: II   Neck ROM: full    Dental  (+) Poor Dentition   Pulmonary asthma , former smoker   Pulmonary exam normal        Cardiovascular Exercise Tolerance: Good negative cardio ROS Normal cardiovascular exam Rhythm:regular Rate:Normal     Neuro/Psych  Headaches PSYCHIATRIC DISORDERS   Bipolar Disorder    Neuromuscular disease    GI/Hepatic Neg liver ROS, hiatal hernia,GERD  ,,  Endo/Other  negative endocrine ROSdiabetes    Renal/GU Renal disease  negative genitourinary   Musculoskeletal   Abdominal   Peds  Hematology negative hematology ROS (+)   Anesthesia Other Findings Past Medical History: No date: Asthma No date: Bipolar 1 disorder (HCC) No date: Diabetes mellitus without complication (HCC)     Comment:  controlled by weight loss No date: GERD (gastroesophageal reflux disease) No date: History of CVA with residual deficit No date: HIV infection (HCC) 08/22/2022: Hyperlipidemia No date: Migraine headache     Comment:  2-3 X/month No date: Nephrolithiasis No date: Wears dentures     Comment:  full upper and lower Past Surgical History: No date: COLONOSCOPY 05/30/2019: COLONOSCOPY WITH PROPOFOL; N/A     Comment:  Procedure: COLONOSCOPY WITH PROPOFOL;  Surgeon: Toney Reil, MD;  Location: [facility] ENDOSCOPY;  Service:               Gastroenterology;  Laterality: N/A; No date: ELBOW SURGERY; Left     Comment:  fracture 05/30/2019: ESOPHAGOGASTRODUODENOSCOPY (EGD) WITH PROPOFOL; N/A     Comment:  Procedure: ESOPHAGOGASTRODUODENOSCOPY (EGD) WITH               PROPOFOL;  Surgeon: Toney Reil, MD;  Location:               [facility]  ENDOSCOPY;  Service: Gastroenterology;  Laterality:               N/A; 09/12/2019: ESOPHAGOGASTRODUODENOSCOPY (EGD) WITH PROPOFOL; N/A     Comment:  Procedure: ESOPHAGOGASTRODUODENOSCOPY (EGD) WITH               PROPOFOL;  Surgeon: Toney Reil, MD;  Location:               [facility] ENDOSCOPY;  Service: Gastroenterology;  Laterality:               N/A; 11/23/2022: ESOPHAGOGASTRODUODENOSCOPY (EGD) WITH PROPOFOL; N/A     Comment:  Procedure: ESOPHAGOGASTRODUODENOSCOPY (EGD) WITH               PROPOFOL;  Surgeon: Toney Reil, MD;  Location:               [facility] ENDOSCOPY;  Service: Gastroenterology;  Laterality:               N/A; No date: FACIAL COSMETIC SURGERY No date: GASTRIC BYPASS No date: HERNIA REPAIR No date: KIDNEY STONE SURGERY No date: renal artery repair BMI    Body Mass Index: 22.81 kg/m     Reproductive/Obstetrics negative OB ROS  Anesthesia Physical Anesthesia Plan  ASA: 3  Anesthesia Plan: General   Post-op Pain Management:    Induction:   PONV Risk Score and Plan:   Airway Management Planned:   Additional Equipment:   Intra-op Plan:   Post-operative Plan:   Informed Consent: I have reviewed the patients History and Physical, chart, labs and discussed the procedure including the risks, benefits and alternatives for the proposed anesthesia with the patient or authorized representative who has indicated his/her understanding and acceptance.     Dental Advisory Given  Plan Discussed with: CRNA  Anesthesia Plan Comments:        Anesthesia Quick Evaluation

## 2023-03-22 NOTE — H&P (Signed)
Arlyss Repress, MD [address]  [address]  [place], [place] [address]  Main: [phone]  Fax: [phone] Pager: [phone]  Primary Care Physician:  Erasmo Downer, MD Primary Gastroenterologist:  Dr. Arlyss Repress  Pre-Procedure History & Physical: HPI:  Troy [name] is a 52 y.o. male is here for an colonoscopy.   Past Medical History:  Diagnosis Date   Asthma    Bipolar 1 disorder (HCC)    Diabetes mellitus without complication (HCC)    controlled by weight loss   GERD (gastroesophageal reflux disease)    History of CVA with residual deficit    HIV infection (HCC)    Hyperlipidemia 08/22/2022   Migraine headache    2-3 X/month   Nephrolithiasis    Wears dentures    full upper and lower    Past Surgical History:  Procedure Laterality Date   COLONOSCOPY     COLONOSCOPY WITH PROPOFOL N/A 05/30/2019   Procedure: COLONOSCOPY WITH PROPOFOL;  Surgeon: Toney Reil, MD;  Location: [facility] ENDOSCOPY;  Service: Gastroenterology;  Laterality: N/A;   ELBOW SURGERY Left    fracture   ESOPHAGOGASTRODUODENOSCOPY (EGD) WITH PROPOFOL N/A 05/30/2019   Procedure: ESOPHAGOGASTRODUODENOSCOPY (EGD) WITH PROPOFOL;  Surgeon: Toney Reil, MD;  Location: [facility] ENDOSCOPY;  Service: Gastroenterology;  Laterality: N/A;   ESOPHAGOGASTRODUODENOSCOPY (EGD) WITH PROPOFOL N/A 09/12/2019   Procedure: ESOPHAGOGASTRODUODENOSCOPY (EGD) WITH PROPOFOL;  Surgeon: Toney Reil, MD;  Location: [facility] ENDOSCOPY;  Service: Gastroenterology;  Laterality: N/A;   ESOPHAGOGASTRODUODENOSCOPY (EGD) WITH PROPOFOL N/A 11/23/2022   Procedure: ESOPHAGOGASTRODUODENOSCOPY (EGD) WITH PROPOFOL;  Surgeon: Toney Reil, MD;  Location: [facility] ENDOSCOPY;  Service: Gastroenterology;  Laterality: N/A;   FACIAL COSMETIC SURGERY     GASTRIC BYPASS     HERNIA REPAIR     KIDNEY STONE SURGERY     renal artery repair      Prior to Admission medications   Medication Sig Start Date End Date Taking?  Authorizing Provider  albuterol (VENTOLIN HFA) 108 (90 Base) MCG/ACT inhaler Inhale 2 puffs into the lungs every 6 (six) hours as needed for wheezing or shortness of breath. 07/20/22   Caro Laroche, DO  ARIPiprazole (ABILIFY) 15 MG tablet Take 1 tablet (15 mg total) by mouth daily. 01/30/23 04/30/23  [name], MD  atorvastatin (LIPITOR) 20 MG tablet Take 1 tablet (20 mg total) by mouth daily. 01/23/23   Randall Hiss, MD  beclomethasone (QVAR) 80 MCG/ACT inhaler Inhale 1 puff into the lungs 2 (two) times daily. 07/20/22   Caro Laroche, DO  bictegravir-emtricitabine-tenofovir AF (BIKTARVY) 50-200-25 MG TABS tablet Take 1 tablet by mouth daily. 01/23/23   Randall Hiss, MD  Budeson-Glycopyrrol-Formoterol (BREZTRI AEROSPHERE) 160-9-4.8 MCG/ACT AERO Inhale 2 puffs into the lungs 2 (two) times daily. 09/29/22   Jacky Kindle, FNP  cetirizine (ZYRTEC) 10 MG tablet Take 1 tablet (10 mg total) by mouth daily. 02/03/22   Bacigalupo, Marzella Schlein, MD  eszopiclone (LUNESTA) 2 MG TABS tablet Take 1 tablet (2 mg total) by mouth at bedtime as needed for sleep. Take immediately before bedtime 11/03/22 12/03/22  [name], MD  eszopiclone 3 MG TABS Take 1 tablet (3 mg total) by mouth at bedtime. Take immediately before bedtime 04/14/23 05/14/23  [name], MD  fluticasone (FLONASE) 50 MCG/ACT nasal spray Place 2 sprays into both nostrils daily. 10/06/22   Erasmo Downer, MD  montelukast (SINGULAIR) 10 MG tablet TAKE 1 TABLET BY MOUTH EVERY DAY 12/19/22  Erasmo Downer, MD  omeprazole (PRILOSEC) 40 MG capsule Take 1 capsule (40 mg total) by mouth in the morning and at bedtime. 02/15/23   Margaretann Loveless, PA-C  ondansetron (ZOFRAN-ODT) 4 MG disintegrating tablet Take 1 tablet (4 mg total) by mouth every 8 (eight) hours as needed. 02/15/23   Margaretann Loveless, PA-C  prazosin (MINIPRESS) 1 MG capsule Take 1 capsule (1 mg total) by mouth at bedtime. 03/24/23 06/22/23  [name], MD   sertraline (ZOLOFT) 100 MG tablet Take 1 tablet (100 mg total) by mouth at bedtime. 03/09/23 06/07/23  [name], MD  SUMAtriptan (IMITREX) 100 MG tablet TAKE ONE TABLET BY MOUTH AT ONSET OF THE HEADACHE. MAY REPEAT IN TWO HOURS 02/03/22   Erasmo Downer, MD  tiZANidine (ZANAFLEX) 4 MG tablet TAKE 1 CAPSULE (4 MG TOTAL) BY MOUTH 2 (TWO) TIMES DAILY. 02/16/23   Erasmo Downer, MD  topiramate (TOPAMAX) 100 MG tablet TAKE 1 TABLET BY MOUTH TWICE A DAY 12/19/22   Erasmo Downer, MD    Allergies as of 03/06/2023 - Review Complete 02/15/2023  Allergen Reaction Noted   Niacin Anaphylaxis 08/10/2011   Orange oil Anaphylaxis 06/29/2016   Zolpidem  12/16/2015    Family History  Problem Relation Age of Onset   Bipolar disorder Mother    COPD Mother    Diabetes Mother    Hypertension Mother    Hyperlipidemia Mother    Healthy Father    Cancer Father        skin   Stroke Brother    Heart disease Brother    Seizures Brother    Thyroid disease Brother    Diabetes Maternal Grandfather    Hyperlipidemia Maternal Grandfather    Cancer Maternal Grandmother        breast   Hyperlipidemia Maternal Grandmother    Hypertension Maternal Grandmother    Heart disease Paternal Grandfather    Hypertension Paternal Grandfather     Social History   Socioeconomic History   Marital status: Single    Spouse name: Not on file   Number of children: 0   Years of education: Not on file   Highest education level: Not on file  Occupational History   Occupation: International aid/development worker    Employer: [employer]  Tobacco Use   Smoking status: Former    Packs/day: 1.00    Years: 25.00    Additional pack years: 0.00    Total pack years: 25.00    Types: Cigarettes    Quit date: 2013    Years since quitting: 11.4   Smokeless tobacco: Never   Tobacco comments:    Currently only vapes  Vaping Use   Vaping Use: Every day   Substances: Nicotine, Flavoring   Devices: Mod  Substance and Sexual  Activity   Alcohol use: Not Currently    Comment: 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, active in [place]   Drug use: Not Currently    Comment: previously used, no IV drugs, 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   Sexual activity: Not Currently    Partners: Male    Comment: patient given condoms  Other Topics Concern   Not on file  Social History Narrative   Not on file   Social Determinants of Health   Financial Resource Strain: High Risk (09/30/2022)   Overall Financial Resource Strain (CARDIA)    Difficulty of Paying Living Expenses: Very hard  Food Insecurity: No Food Insecurity (09/30/2022)   Hunger Vital Sign    Worried About Running  Out of Food in the Last Year: Never true    Ran Out of Food in the Last Year: Never true  Transportation Needs: No Transportation Needs (09/30/2022)   PRAPARE - Administrator, Civil Service (Medical): No    Lack of Transportation (Non-Medical): No  Physical Activity: Not on file  Stress: Not on file  Social Connections: Not on file  Intimate Partner Violence: Not on file    Review of Systems: See HPI, otherwise negative ROS  Physical Exam: BP 110/74   Pulse 60   Temp (!) 96.7 F (35.9 C) (Temporal)   Resp 17   Ht 5\' 8"  (1.727 m)   Wt 68 kg   SpO2 100%   BMI 22.81 kg/m  General:   Alert,  pleasant and cooperative in NAD Head:  Normocephalic and atraumatic. Neck:  Supple; no masses or thyromegaly. Lungs:  Clear throughout to auscultation.    Heart:  Regular rate and rhythm. Abdomen:  Soft, nontender and nondistended. Normal bowel sounds, without guarding, and without rebound.   Neurologic:  Alert and  oriented x4;  grossly normal neurologically.  Impression/Plan: Troy [name] is here for an colonoscopy to be performed for colon cancer screening  Risks, benefits, limitations, and alternatives regarding  colonoscopy have been reviewed with the patient.  Questions have been answered.  All parties agreeable.   Lannette Donath, MD  03/22/2023, 8:50 AM

## 2023-03-22 NOTE — Telephone Encounter (Signed)
-----   Message from Toney Reil, MD sent at 03/22/2023  9:29 AM EDT ----- Regarding: Colon cancer screening Please do not schedule screening colonoscopy as patient did not want to do bowel prep.  I have advised him to talk to PCP about stool testing for colon cancer screening  Thanks RV

## 2023-03-22 NOTE — Op Note (Addendum)
[facility] Gastroenterology Patient Name: Troy [name] Procedure Date: 03/22/2023 8:44 AM MRN: [id number] Account #: [id number] Date of Birth: 10-07-1971 Admit Type: Outpatient Age: 52 Room: [facility] ENDO ROOM 4 Gender: Male Note Status: Finalized Instrument Name: Nelda Marseille [id number] Procedure:             Colonoscopy Indications:           Screening for colorectal malignant neoplasm, Screening                         for colorectal malignant neoplasm, inadequate bowel                         prep on last colonoscopy (more recent than 10 years                         ago), Last colonoscopy: August 2020 Providers:             Toney Reil MD, MD Referring MD:          Marzella Schlein. Bacigalupo (Referring MD) Medicines:             General Anesthesia Complications:         No immediate complications. Estimated blood loss: None. Procedure:             Pre-Anesthesia Assessment:                        - Prior to the procedure, a History and Physical was                         performed, and patient medications and allergies were                         reviewed. The patient is competent. The risks and                         benefits of the procedure and the sedation options and                         risks were discussed with the patient. All questions                         were answered and informed consent was obtained.                         Patient identification and proposed procedure were                         verified by the physician, the nurse, the                         anesthesiologist, the anesthetist and the technician                         in the pre-procedure area in the procedure room in the                         endoscopy suite. Mental Status Examination: alert and  oriented. Airway Examination: normal oropharyngeal                         airway and neck mobility. Respiratory Examination:                         clear  to auscultation. CV Examination: normal.                         Prophylactic Antibiotics: The patient does not require                         prophylactic antibiotics. Prior Anticoagulants: The                         patient has taken no anticoagulant or antiplatelet                         agents. ASA Grade Assessment: III - A patient with                         severe systemic disease. After reviewing the risks and                         benefits, the patient was deemed in satisfactory                         condition to undergo the procedure. The anesthesia                         plan was to use general anesthesia. Immediately prior                         to administration of medications, the patient was                         re-assessed for adequacy to receive sedatives. The                         heart rate, respiratory rate, oxygen saturations,                         blood pressure, adequacy of pulmonary ventilation, and                         response to care were monitored throughout the                         procedure. The physical status of the patient was                         re-assessed after the procedure.                        After obtaining informed consent, the colonoscope was                         passed under direct vision. Throughout the procedure,  the patient's blood pressure, pulse, and oxygen                         saturations were monitored continuously. The procedure                         was aborted. The colonscope was not inserted.                         Medications were given. The colonoscopy was unusually                         difficult due to inadequate bowel prep. The patient                         tolerated the procedure well. The quality of the bowel                         preparation was poor. Findings:      The perianal and digital rectal examinations were normal. Pertinent       negatives include  normal sphincter tone and no palpable rectal lesions.      Copious quantities of semi-liquid stool was found in the rectum, in the       recto-sigmoid colon and in the sigmoid colon, precluding visualization. Impression:            - Preparation of the colon was poor.                        - Stool in the rectum, in the recto-sigmoid colon and                         in the sigmoid colon.                        - No specimens collected. Recommendation:        - Discharge patient to home (with escort).                        - Resume regular diet today.                        - Return to primary care physician to discuss about                         non invasive screening tests for colon cancer as                         patient is not willing to take bowel prep. Diagnosis Code(s):     --- Professional ---                        Z12.11, Encounter for screening for malignant neoplasm                         of colon Dr. Libby Maw Toney Reil MD, MD 03/22/2023 9:09:23 AM This report has been signed electronically. Number of Addenda: 0 Note Initiated On: 03/22/2023 8:44 AM Total Procedure Duration: 0 hours 2 minutes  19 seconds  Estimated Blood Loss:  Estimated blood loss: none.      [facility]

## 2023-03-22 NOTE — Anesthesia Postprocedure Evaluation (Signed)
Anesthesia Post Note  Patient: Troy [name]  Procedure(s) Performed: COLONOSCOPY WITH PROPOFOL  Patient location during evaluation: PACU Anesthesia Type: General Level of consciousness: awake and alert Pain management: pain level controlled Vital Signs Assessment: post-procedure vital signs reviewed and stable Respiratory status: spontaneous breathing, nonlabored ventilation, respiratory function stable and patient connected to nasal cannula oxygen Cardiovascular status: blood pressure returned to baseline and stable Postop Assessment: no apparent nausea or vomiting Anesthetic complications: no   No notable events documented.   Last Vitals:  Vitals:   03/22/23 0911 03/22/23 0921  BP: 99/71 111/72  Pulse:    Resp:    Temp:    SpO2:      Last Pain:  Vitals:   03/22/23 0931  TempSrc:   PainSc: 0-No pain                 Yevette Edwards

## 2023-03-22 NOTE — Transfer of Care (Signed)
Immediate Anesthesia Transfer of Care Note  Patient: Troy [name]  Procedure(s) Performed: COLONOSCOPY WITH PROPOFOL  Patient Location: PACU  Anesthesia Type:General  Level of Consciousness: sedated  Airway & Oxygen Therapy: Patient Spontanous Breathing and Patient connected to nasal cannula oxygen  Post-op Assessment: Report given to RN and Post -op Vital signs reviewed and stable  Post vital signs: Reviewed  Last Vitals:  Vitals Value Taken Time  BP 99/71 03/22/23 0911  Temp    Pulse 57 03/22/23 0912  Resp 18 03/22/23 0912  SpO2 98 % 03/22/23 0912  Vitals shown include unvalidated device data.  Last Pain:  Vitals:   03/22/23 0911  TempSrc: Temporal  PainSc: 0-No pain         Complications: No notable events documented.

## 2023-03-23 ENCOUNTER — Encounter: Payer: Self-pay | Admitting: Gastroenterology

## 2023-03-24 ENCOUNTER — Encounter
Admission: RE | Admit: 2023-03-24 | Discharge: 2023-03-24 | Disposition: A | Discharge status: Home / Self Care | Payer: Medicaid Other | Source: Ambulatory Visit | Attending: Gastroenterology | Admitting: Gastroenterology

## 2023-03-24 ENCOUNTER — Other Ambulatory Visit: Payer: Self-pay

## 2023-03-24 DIAGNOSIS — R634 Abnormal weight loss: Secondary | ICD-10-CM

## 2023-03-24 DIAGNOSIS — R1013 Epigastric pain: Secondary | ICD-10-CM

## 2023-03-24 DIAGNOSIS — R112 Nausea with vomiting, unspecified: Secondary | ICD-10-CM

## 2023-03-25 DIAGNOSIS — Z419 Encounter for procedure for purposes other than remedying health state, unspecified: Secondary | ICD-10-CM | POA: Diagnosis not present

## 2023-03-28 NOTE — Progress Notes (Deleted)
Celso Amy, PA-C [address]  [address]  [place], [place] [address]  Main: [phone]  Fax: [phone]   Primary Care Physician: Erasmo Downer, MD  Primary Gastroenterologist:  Dr. Lannette Donath   No chief complaint on file.   HPI: Troy [name] is a 52 y.o. male established patient of Dr. Allegra Lai, returns for follow-up of weight loss, epigastric pain, nausea, and vomiting.  Hx of Reflux Esophagitis and Hiatal Hernia.  Takes Omeprazole 40mg  BID.  Medical history is significant for gastric bypass surgery, hernia repair, and bipolar disorder.  History of asthma, diabetes, CVA, and HIV.  Is on Topamax 100mg  BID which can cause N/V and weight loss.  Colonoscopy 02/2023 showed Poor Prep - Pt. Not able to do Colon Prep. EGD 10/2022 showed sleeve gastrectomy and medium hiatal hernia, otherwise normal. Bx Neg. EGD 05/2019 showed LA grade C reflux esophagitis.  Biopsies negative for celiac and H. pylori. EGD 08/2019 showed normal esophagus. Colonoscopy 05/2019 showed 1 small 6 mm adenoma polyp removed from descending colon, fair prep.  Labs: 02/13/23 Normal CBC, CMP, Lipase & UA, except + ketones in urine, Elevated Creatinine 1.35.  CT Abd / Pelvis w/ Contrast 02/13/23 showed NO acute abnormality.  Incidental bilateral nonobstructing kidney stones and 4mm RLL lung nodule.  NO gallstones or bowel inflammation.  Abd Xrays in 2021 and 2022 showed Moderate stool in colon / Constipation.  Pt. Is scheduled for Gastric Empty Study 03/31/23.    Current Outpatient Medications  Medication Sig Dispense Refill   albuterol (VENTOLIN HFA) 108 (90 Base) MCG/ACT inhaler Inhale 2 puffs into the lungs every 6 (six) hours as needed for wheezing or shortness of breath. 8 g 2   ARIPiprazole (ABILIFY) 15 MG tablet Take 1 tablet (15 mg total) by mouth daily. 90 tablet 0   atorvastatin (LIPITOR) 20 MG tablet Take 1 tablet (20 mg total) by mouth daily. 30 tablet 11   beclomethasone (QVAR) 80 MCG/ACT  inhaler Inhale 1 puff into the lungs 2 (two) times daily. 1 each 2   bictegravir-emtricitabine-tenofovir AF (BIKTARVY) 50-200-25 MG TABS tablet Take 1 tablet by mouth daily. 30 tablet 11   Budeson-Glycopyrrol-Formoterol (BREZTRI AEROSPHERE) 160-9-4.8 MCG/ACT AERO Inhale 2 puffs into the lungs 2 (two) times daily. 10.7 g 11   cetirizine (ZYRTEC) 10 MG tablet Take 1 tablet (10 mg total) by mouth daily. 90 tablet 3   eszopiclone (LUNESTA) 2 MG TABS tablet Take 1 tablet (2 mg total) by mouth at bedtime as needed for sleep. Take immediately before bedtime 30 tablet 0   [START ON 04/14/2023] eszopiclone 3 MG TABS Take 1 tablet (3 mg total) by mouth at bedtime. Take immediately before bedtime 30 tablet 0   fluticasone (FLONASE) 50 MCG/ACT nasal spray Place 2 sprays into both nostrils daily. 16 g 6   montelukast (SINGULAIR) 10 MG tablet TAKE 1 TABLET BY MOUTH EVERY DAY 90 tablet 3   omeprazole (PRILOSEC) 40 MG capsule Take 1 capsule (40 mg total) by mouth in the morning and at bedtime. 180 capsule 0   ondansetron (ZOFRAN-ODT) 4 MG disintegrating tablet Take 1 tablet (4 mg total) by mouth every 8 (eight) hours as needed. 30 tablet 0   prazosin (MINIPRESS) 1 MG capsule Take 1 capsule (1 mg total) by mouth at bedtime. 90 capsule 0   sertraline (ZOLOFT) 100 MG tablet Take 1 tablet (100 mg total) by mouth at bedtime. 90 tablet 0   SUMAtriptan (IMITREX) 100 MG tablet TAKE ONE TABLET BY  MOUTH AT ONSET OF THE HEADACHE. MAY REPEAT IN TWO HOURS 10 tablet 3   tiZANidine (ZANAFLEX) 4 MG tablet TAKE 1 CAPSULE (4 MG TOTAL) BY MOUTH 2 (TWO) TIMES DAILY. 180 tablet 0   topiramate (TOPAMAX) 100 MG tablet TAKE 1 TABLET BY MOUTH TWICE A DAY 180 tablet 3   No current facility-administered medications for this visit.    Allergies as of 03/29/2023 - Review Complete 03/22/2023  Allergen Reaction Noted   Niacin Anaphylaxis 08/10/2011   Orange oil Anaphylaxis 06/29/2016   Zolpidem  12/16/2015    Past Medical History:   Diagnosis Date   Asthma    Bipolar 1 disorder (HCC)    Diabetes mellitus without complication (HCC)    controlled by weight loss   GERD (gastroesophageal reflux disease)    History of CVA with residual deficit    HIV infection (HCC)    Hyperlipidemia 08/22/2022   Migraine headache    2-3 X/month   Nephrolithiasis    Wears dentures    full upper and lower    Past Surgical History:  Procedure Laterality Date   COLONOSCOPY     COLONOSCOPY WITH PROPOFOL N/A 05/30/2019   Procedure: COLONOSCOPY WITH PROPOFOL;  Surgeon: Toney Reil, MD;  Location: [facility] ENDOSCOPY;  Service: Gastroenterology;  Laterality: N/A;   COLONOSCOPY WITH PROPOFOL N/A 03/22/2023   Procedure: COLONOSCOPY WITH PROPOFOL;  Surgeon: Toney Reil, MD;  Location: [facility] ENDOSCOPY;  Service: Gastroenterology;  Laterality: N/A;   ELBOW SURGERY Left    fracture   ESOPHAGOGASTRODUODENOSCOPY (EGD) WITH PROPOFOL N/A 05/30/2019   Procedure: ESOPHAGOGASTRODUODENOSCOPY (EGD) WITH PROPOFOL;  Surgeon: Toney Reil, MD;  Location: [facility] ENDOSCOPY;  Service: Gastroenterology;  Laterality: N/A;   ESOPHAGOGASTRODUODENOSCOPY (EGD) WITH PROPOFOL N/A 09/12/2019   Procedure: ESOPHAGOGASTRODUODENOSCOPY (EGD) WITH PROPOFOL;  Surgeon: Toney Reil, MD;  Location: [facility] ENDOSCOPY;  Service: Gastroenterology;  Laterality: N/A;   ESOPHAGOGASTRODUODENOSCOPY (EGD) WITH PROPOFOL N/A 11/23/2022   Procedure: ESOPHAGOGASTRODUODENOSCOPY (EGD) WITH PROPOFOL;  Surgeon: Toney Reil, MD;  Location: [facility] ENDOSCOPY;  Service: Gastroenterology;  Laterality: N/A;   FACIAL COSMETIC SURGERY     GASTRIC BYPASS     HERNIA REPAIR     KIDNEY STONE SURGERY     renal artery repair      Review of Systems:    All systems reviewed and negative except where noted in HPI.   Physical Examination:   There were no vitals taken for this visit.  General: Well-nourished, well-developed in no acute distress.  Eyes: No icterus. Conjunctivae  pink. Mouth: Oropharyngeal mucosa moist and pink , no lesions erythema or exudate. Lungs: Clear to auscultation bilaterally. Non-labored. Heart: Regular rate and rhythm, no murmurs rubs or gallops.  Abdomen: Bowel sounds are normal; Abdomen is Soft; No hepatosplenomegaly, masses or hernias;  No Abdominal Tenderness; No guarding or rebound tenderness. Extremities: No lower extremity edema. No clubbing or deformities. Neuro: Alert and oriented x 3.  Grossly intact. Skin: Warm and dry, no jaundice.   Psych: Alert and cooperative, normal mood and affect.  Imaging Studies: No results found.  Assessment and Plan:   Troy [name] is a 52 y.o. y/o male Presents for:  Chronic Nausea / Vomiting  C/W Plan for GES 03/31/23  RUQ [place] / HIDA?  Hold Topomax?  Zofran prn.  Reflux Esophagitis and Medium Hiatal Hernia  Continue BID PPI Recommend Lifestyle Modifications to prevent Acid Reflux.  Rec. Avoid coffee, sodas, peppermint, citrus fruits, and spicey foods.  Avoid eating 2-3 hours before bedtime.  Add Carafate?  Chronic Constipation  Tx  History of Gastric Bypass      Celso Amy, PA-C  Follow up in ***  BP check ***

## 2023-03-29 ENCOUNTER — Ambulatory Visit: Payer: Medicaid Other | Admitting: Physician Assistant

## 2023-03-29 ENCOUNTER — Encounter: Payer: Self-pay | Admitting: Physician Assistant

## 2023-03-31 ENCOUNTER — Encounter
Admission: RE | Admit: 2023-03-31 | Discharge: 2023-03-31 | Disposition: A | Discharge status: Home / Self Care | Payer: Medicaid Other | Source: Ambulatory Visit | Attending: Gastroenterology | Admitting: Gastroenterology

## 2023-03-31 DIAGNOSIS — R112 Nausea with vomiting, unspecified: Secondary | ICD-10-CM | POA: Insufficient documentation

## 2023-03-31 DIAGNOSIS — R634 Abnormal weight loss: Secondary | ICD-10-CM | POA: Diagnosis not present

## 2023-03-31 DIAGNOSIS — R1013 Epigastric pain: Secondary | ICD-10-CM | POA: Insufficient documentation

## 2023-03-31 DIAGNOSIS — K30 Functional dyspepsia: Secondary | ICD-10-CM | POA: Diagnosis not present

## 2023-03-31 MED ORDER — TECHNETIUM TC 99M SULFUR COLLOID
1.9400 | Freq: Once | INTRAVENOUS | Status: AC | PRN
  Administered 2023-03-31: 1.94 via ORAL

## 2023-04-03 ENCOUNTER — Telehealth: Payer: Self-pay

## 2023-04-03 MED ORDER — METOCLOPRAMIDE HCL 5 MG PO TABS: 5.0000 mg | ORAL_TABLET | Freq: Three times a day (TID) | ORAL | 0 refills | Status: DC

## 2023-04-03 NOTE — Telephone Encounter (Signed)
Patient verbalized understanding of results. Sent medication to the pharmacy. He will let us know how it helps

## 2023-04-03 NOTE — Telephone Encounter (Signed)
-----   Message from Toney Reil, MD sent at 04/03/2023  2:33 PM EDT ----- Please inform patient that he has delayed gastric emptying study which means he has gastroparesis.  This explains chronic nausea.  Recommend to start Reglan 5 mg before each meal and at bedtime, 1 month supply only.  Patient should let us know if this medication is helping  RV

## 2023-04-04 ENCOUNTER — Other Ambulatory Visit: Payer: Self-pay | Admitting: Psychiatry

## 2023-04-18 ENCOUNTER — Ambulatory Visit
Admission: RE | Admit: 2023-04-18 | Discharge: 2023-04-18 | Disposition: A | Discharge status: Home / Self Care | Payer: Medicaid Other | Source: Ambulatory Visit | Attending: Physician Assistant | Admitting: Physician Assistant

## 2023-04-18 ENCOUNTER — Ambulatory Visit
Admission: RE | Admit: 2023-04-18 | Discharge: 2023-04-18 | Disposition: A | Discharge status: Home / Self Care | Payer: Medicaid Other | Attending: Physician Assistant | Admitting: Physician Assistant

## 2023-04-18 ENCOUNTER — Ambulatory Visit (INDEPENDENT_AMBULATORY_CARE_PROVIDER_SITE_OTHER): Payer: Medicaid Other | Admitting: Physician Assistant

## 2023-04-18 VITALS — BP 116/70 | HR 70 | Temp 97.7°F | Resp 18 | Ht 68.0 in | Wt 147.7 lb

## 2023-04-18 DIAGNOSIS — S298XXA Other specified injuries of thorax, initial encounter: Secondary | ICD-10-CM

## 2023-04-18 DIAGNOSIS — T07XXXA Unspecified multiple injuries, initial encounter: Secondary | ICD-10-CM

## 2023-04-18 DIAGNOSIS — R339 Retention of urine, unspecified: Secondary | ICD-10-CM

## 2023-04-18 DIAGNOSIS — Y92009 Unspecified place in unspecified non-institutional (private) residence as the place of occurrence of the external cause: Secondary | ICD-10-CM

## 2023-04-18 LAB — POCT URINALYSIS DIPSTICK
Bilirubin, UA: NEGATIVE
Blood, UA: NEGATIVE
Glucose, UA: NEGATIVE
Ketones, UA: NEGATIVE
Leukocytes, UA: NEGATIVE
Nitrite, UA: NEGATIVE
Odor: NORMAL
Protein, UA: NEGATIVE
Spec Grav, UA: 1.02 (ref 1.010–1.025)
Urobilinogen, UA: 0.2 E.U./dL
pH, UA: 5 (ref 5.0–8.0)
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
## 2023-04-18 NOTE — Progress Notes (Signed)
The x-rays of your ribs did not show any acute fractures or dislocations.  It is likely that the cartilage that holds the lower ribs in place may have been damaged or bruised which could cause the swelling that we noticed on the left side but this may not show up well on an xray.  You can continue to use over-the-counter pain medications and please make sure that you are taking deep breaths as we discussed during your appointment

## 2023-04-19 DIAGNOSIS — R339 Retention of urine, unspecified: Secondary | ICD-10-CM | POA: Insufficient documentation

## 2023-04-19 NOTE — Assessment & Plan Note (Signed)
Chronic, ongoing concern Patient reports intermittent, recurrent episodes of difficulty urinating in which he has to strain or push to begin urination  Reviewed most recent CT abdomen/pelvis from April 2024 and he has 2  5 mm kidney stones in left kidney which may be contributory He also reports a hx of enlarged prostate but states he is not being treated for this He declined my offer to repeat CT scan today but would like referral to Urology for potential management Referral placed UA was normal today- results reviewed with patient during apt  Follow up as needed for persistent or progressing symptoms
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
## 2023-04-24 DIAGNOSIS — Z419 Encounter for procedure for purposes other than remedying health state, unspecified: Secondary | ICD-10-CM | POA: Diagnosis not present

## 2023-04-25 ENCOUNTER — Ambulatory Visit (INDEPENDENT_AMBULATORY_CARE_PROVIDER_SITE_OTHER): Payer: Medicaid Other | Admitting: Physician Assistant

## 2023-04-25 ENCOUNTER — Encounter: Payer: Self-pay | Admitting: Physician Assistant

## 2023-04-25 VITALS — BP 137/87 | HR 49 | Wt 145.8 lb

## 2023-04-25 DIAGNOSIS — R3911 Hesitancy of micturition: Secondary | ICD-10-CM | POA: Diagnosis not present

## 2023-04-25 DIAGNOSIS — N401 Enlarged prostate with lower urinary tract symptoms: Secondary | ICD-10-CM | POA: Diagnosis not present

## 2023-04-25 DIAGNOSIS — N200 Calculus of kidney: Secondary | ICD-10-CM | POA: Diagnosis not present

## 2023-04-25 DIAGNOSIS — R3915 Urgency of urination: Secondary | ICD-10-CM | POA: Diagnosis not present

## 2023-04-25 LAB — URINALYSIS, COMPLETE
Bilirubin, UA: NEGATIVE
Glucose, UA: NEGATIVE
Ketones, UA: NEGATIVE
Leukocytes,UA: NEGATIVE
Nitrite, UA: NEGATIVE
Protein,UA: NEGATIVE
RBC, UA: NEGATIVE
Specific Gravity, UA: 1.015 (ref 1.005–1.030)
Urobilinogen, Ur: 0.2 mg/dL (ref 0.2–1.0)
pH, UA: 7 (ref 5.0–7.5)

## 2023-04-25 LAB — MICROSCOPIC EXAMINATION: Bacteria, UA: NONE SEEN

## 2023-04-25 LAB — BLADDER SCAN AMB NON-IMAGING

## 2023-04-25 MED ORDER — TAMSULOSIN HCL 0.4 MG PO CAPS
0.4000 mg | ORAL_CAPSULE | Freq: Every day | ORAL | 11 refills | Status: DC
Start: 2023-04-25 — End: 2023-08-31

## 2023-04-25 MED ORDER — POTASSIUM CITRATE ER 10 MEQ (1080 MG) PO TBCR
10.0000 meq | EXTENDED_RELEASE_TABLET | Freq: Three times a day (TID) | ORAL | 11 refills | Status: DC
Start: 2023-04-25 — End: 2024-05-27

## 2023-04-25 NOTE — Patient Instructions (Signed)
When selecting a soda to drink, ones containing citric acid are better from a stone prevention standpoint than those containing phosphoric acid. Try [employer] protein shakes, which taste like flavored milk.

## 2023-04-25 NOTE — Progress Notes (Signed)
04/25/2023 2:41 PM   Troy [name] 1970/11/24 [id number]  CC: Chief Complaint  Patient presents with   Urinary Retention   Nephrolithiasis   HPI: Troy [name] is a 52 y.o. male with PMH recurrent nephrolithiasis, diabetes, CVA, and BPH previously on pharmacotherapy who presents today for evaluation of possible urinary retention and nephrolithiasis.   Today he reports concerns regarding nephrolithiasis seen on recent CT scan.  He states he has had about 15 prior stone episodes.  He has been on potassium citrate in the past, but is not currently and would be interested in resuming this.  He admits to being poorly hydrated and drinking primarily [facility] and tea.  He was recently diagnosed with gastroparesis, so his p.o. intake has been very poor as well.  He was previously on Flomax for BPH but has stopped this.  He would like to resume it at this time.  He is primarily bothered by urinary hesitancy.  IPSS 14/mixed as below.  CTAP with contrast dated 02/13/2023 was notable for bilateral nonobstructing renal stones measuring up to 5 mm.  In-office UA and microscopy today pan negative. PVR 41mL.  PMH: Past Medical History:  Diagnosis Date   Asthma    Bipolar 1 disorder (HCC)    Diabetes mellitus without complication (HCC)    controlled by weight loss   GERD (gastroesophageal reflux disease)    History of CVA with residual deficit    HIV infection (HCC)    Hyperlipidemia 08/22/2022   Migraine headache    2-3 X/month   Nephrolithiasis    Wears dentures    full upper and lower    Surgical History: Past Surgical History:  Procedure Laterality Date   COLONOSCOPY     COLONOSCOPY WITH PROPOFOL N/A 05/30/2019   Procedure: COLONOSCOPY WITH PROPOFOL;  Surgeon: Toney Reil, MD;  Location: [facility] ENDOSCOPY;  Service: Gastroenterology;  Laterality: N/A;   COLONOSCOPY WITH PROPOFOL N/A 03/22/2023   Procedure: COLONOSCOPY WITH PROPOFOL;  Surgeon: Toney Reil, MD;   Location: [facility] ENDOSCOPY;  Service: Gastroenterology;  Laterality: N/A;   ELBOW SURGERY Left    fracture   ESOPHAGOGASTRODUODENOSCOPY (EGD) WITH PROPOFOL N/A 05/30/2019   Procedure: ESOPHAGOGASTRODUODENOSCOPY (EGD) WITH PROPOFOL;  Surgeon: Toney Reil, MD;  Location: [facility] ENDOSCOPY;  Service: Gastroenterology;  Laterality: N/A;   ESOPHAGOGASTRODUODENOSCOPY (EGD) WITH PROPOFOL N/A 09/12/2019   Procedure: ESOPHAGOGASTRODUODENOSCOPY (EGD) WITH PROPOFOL;  Surgeon: Toney Reil, MD;  Location: [facility] ENDOSCOPY;  Service: Gastroenterology;  Laterality: N/A;   ESOPHAGOGASTRODUODENOSCOPY (EGD) WITH PROPOFOL N/A 11/23/2022   Procedure: ESOPHAGOGASTRODUODENOSCOPY (EGD) WITH PROPOFOL;  Surgeon: Toney Reil, MD;  Location: [facility] ENDOSCOPY;  Service: Gastroenterology;  Laterality: N/A;   FACIAL COSMETIC SURGERY     GASTRIC BYPASS     HERNIA REPAIR     KIDNEY STONE SURGERY     renal artery repair      Home Medications:  Allergies as of 04/25/2023       Reactions   Niacin Anaphylaxis   Orange Oil Anaphylaxis   Zolpidem    Other reaction(s): Unknown Sleep walking and sleep eating        Medication List        Accurate as of April 25, 2023  2:41 PM. If you have any questions, ask your nurse or doctor.          albuterol 108 (90 Base) MCG/ACT inhaler Commonly known as: VENTOLIN HFA Inhale 2 puffs into the lungs every 6 (six) hours as needed for wheezing or  shortness of breath.   ARIPiprazole 15 MG tablet Commonly known as: ABILIFY Take 1 tablet (15 mg total) by mouth daily.   atorvastatin 20 MG tablet Commonly known as: Lipitor Take 1 tablet (20 mg total) by mouth daily.   beclomethasone 80 MCG/ACT inhaler Commonly known as: QVAR Inhale 1 puff into the lungs 2 (two) times daily.   Biktarvy 50-200-25 MG Tabs tablet Generic drug: bictegravir-emtricitabine-tenofovir AF Take 1 tablet by mouth daily.   Breztri Aerosphere 160-9-4.8 MCG/ACT Aero Generic drug:  Budeson-Glycopyrrol-Formoterol Inhale 2 puffs into the lungs 2 (two) times daily.   cetirizine 10 MG tablet Commonly known as: ZYRTEC Take 1 tablet (10 mg total) by mouth daily.   eszopiclone 2 MG Tabs tablet Commonly known as: LUNESTA Take 1 tablet (2 mg total) by mouth at bedtime as needed for sleep. Take immediately before bedtime   Eszopiclone 3 MG Tabs Commonly known as: eszopiclone Take 1 tablet (3 mg total) by mouth at bedtime. Take immediately before bedtime   fluticasone 50 MCG/ACT nasal spray Commonly known as: FLONASE Place 2 sprays into both nostrils daily.   metoCLOPramide 5 MG tablet Commonly known as: Reglan Take 1 tablet (5 mg total) by mouth 4 (four) times daily -  before meals and at bedtime.   montelukast 10 MG tablet Commonly known as: SINGULAIR TAKE 1 TABLET BY MOUTH EVERY DAY   omeprazole 40 MG capsule Commonly known as: PRILOSEC Take 1 capsule (40 mg total) by mouth in the morning and at bedtime.   ondansetron 4 MG disintegrating tablet Commonly known as: ZOFRAN-ODT Take 1 tablet (4 mg total) by mouth every 8 (eight) hours as needed.   potassium citrate 10 MEQ (1080 MG) SR tablet Commonly known as: UROCIT-K Take 1 tablet (10 mEq total) by mouth 3 (three) times daily with meals. Started by: Carman Ching, PA-C   prazosin 1 MG capsule Commonly known as: MINIPRESS Take 1 capsule (1 mg total) by mouth at bedtime.   sertraline 100 MG tablet Commonly known as: ZOLOFT Take 1 tablet (100 mg total) by mouth at bedtime.   SUMAtriptan 100 MG tablet Commonly known as: IMITREX TAKE ONE TABLET BY MOUTH AT ONSET OF THE HEADACHE. MAY REPEAT IN TWO HOURS   tamsulosin 0.4 MG Caps capsule Commonly known as: FLOMAX Take 1 capsule (0.4 mg total) by mouth daily. Started by: Carman Ching, PA-C   tiZANidine 4 MG tablet Commonly known as: ZANAFLEX TAKE 1 CAPSULE (4 MG TOTAL) BY MOUTH 2 (TWO) TIMES DAILY.   topiramate 100 MG tablet Commonly  known as: TOPAMAX TAKE 1 TABLET BY MOUTH TWICE A DAY        Allergies:  Allergies  Allergen Reactions   Niacin Anaphylaxis   Orange Oil Anaphylaxis   Zolpidem     Other reaction(s): Unknown Sleep walking and sleep eating    Family History: Family History  Problem Relation Age of Onset   Bipolar disorder Mother    COPD Mother    Diabetes Mother    Hypertension Mother    Hyperlipidemia Mother    Healthy Father    Cancer Father        skin   Stroke Brother    Heart disease Brother    Seizures Brother    Thyroid disease Brother    Diabetes Maternal Grandfather    Hyperlipidemia Maternal Grandfather    Cancer Maternal Grandmother        breast   Hyperlipidemia Maternal Grandmother    Hypertension Maternal Grandmother  Heart disease Paternal Grandfather    Hypertension Paternal Grandfather     Social History:   reports that he quit smoking about 11 years ago. His smoking use included cigarettes. He has a 25.00 pack-year smoking history. He has never used smokeless tobacco. He reports that he does not currently use alcohol. He reports that he does not currently use drugs.  Physical Exam: BP 137/87   Pulse (!) 49   Wt 145 lb 12.8 oz (66.1 kg)   BMI 22.17 kg/m   Constitutional:  Alert and oriented, no acute distress, nontoxic appearing HEENT: [place], AT Cardiovascular: No clubbing, cyanosis, or edema Respiratory: Normal respiratory effort, no increased work of breathing Skin: No rashes, bruises or suspicious lesions Neurologic: Grossly intact, no focal deficits, moving all 4 extremities Psychiatric: Normal mood and affect  Laboratory Data: Results for orders placed or performed in visit on 04/25/23  Microscopic Examination   Urine  Result Value Ref Range   WBC, UA 0-5 0 - 5 /hpf   RBC, Urine 0-2 0 - 2 /hpf   Epithelial Cells (non renal) 0-10 0 - 10 /hpf   Bacteria, UA None seen None seen/Few  Urinalysis, Complete  Result Value Ref Range   Specific Gravity,  UA 1.015 1.005 - 1.030   pH, UA 7.0 5.0 - 7.5   Color, UA Yellow Yellow   Appearance Ur Clear Clear   Leukocytes,UA Negative Negative   Protein,UA Negative Negative/Trace   Glucose, UA Negative Negative   Ketones, UA Negative Negative   RBC, UA Negative Negative   Bilirubin, UA Negative Negative   Urobilinogen, Ur 0.2 0.2 - 1.0 mg/dL   Nitrite, UA Negative Negative   Microscopic Examination See below:   Bladder Scan (Post Void Residual) in office  Result Value Ref Range   Scan Result 41ml    Assessment & Plan:   1. Benign prostatic hyperplasia with urinary hesitancy Emptying appropriately.  Primarily bothered by urinary hesitancy.  Will resume Flomax.  We discussed also starting OAB meds for frequency, but he prefers to defer this at this time which is reasonable. - Urinalysis, Complete - Bladder Scan (Post Void Residual) in office - tamsulosin (FLOMAX) 0.4 MG CAPS capsule; Take 1 capsule (0.4 mg total) by mouth daily.  Dispense: 30 capsule; Refill: 11  2. Nephrolithiasis He wishes to resume potassium citrate given recurrent nephrolithiasis.  Will restart this, however I was honest with him that with his poor p.o. intake lately I am not sure he will be able to tolerate it well.  Okay to discontinue if it is poorly tolerated.  Will plan for BMP in 1 month to reassess his serum potassium.  Based on stone size and peripheral location, I do not recommend pursuing definitive management at this time.  He is in agreement with taking a surveillance approach. - potassium citrate (UROCIT-K) 10 MEQ (1080 MG) SR tablet; Take 1 tablet (10 mEq total) by mouth 3 (three) times daily with meals.  Dispense: 90 tablet; Refill: 11   Return in about 4 weeks (around 05/23/2023) for Lab visit for BMP.  Carman Ching, PA-C  [facility] [place] [address] [place], [place] [address] [phone]

## 2023-04-28 ENCOUNTER — Encounter: Payer: Self-pay | Admitting: Psychiatry

## 2023-04-28 ENCOUNTER — Telehealth (INDEPENDENT_AMBULATORY_CARE_PROVIDER_SITE_OTHER): Payer: Medicaid Other | Admitting: Psychiatry

## 2023-04-28 DIAGNOSIS — G4700 Insomnia, unspecified: Secondary | ICD-10-CM | POA: Diagnosis not present

## 2023-04-28 DIAGNOSIS — F3131 Bipolar disorder, current episode depressed, mild: Secondary | ICD-10-CM

## 2023-04-28 DIAGNOSIS — F4310 Post-traumatic stress disorder, unspecified: Secondary | ICD-10-CM | POA: Diagnosis not present

## 2023-04-28 MED ORDER — ARIPIPRAZOLE 15 MG PO TABS: 15.0000 mg | ORAL_TABLET | Freq: Every day | ORAL | 0 refills | Status: DC

## 2023-04-28 MED ORDER — SERTRALINE HCL 100 MG PO TABS: 100.0000 mg | ORAL_TABLET | Freq: Every day | ORAL | 0 refills | Status: DC

## 2023-04-28 MED ORDER — ESZOPICLONE 3 MG PO TABS: 3.0000 mg | ORAL_TABLET | Freq: Every day | ORAL | 1 refills | Status: DC

## 2023-05-01 ENCOUNTER — Ambulatory Visit: Payer: Medicaid Other | Admitting: Family Medicine

## 2023-05-17 ENCOUNTER — Other Ambulatory Visit: Payer: Self-pay | Admitting: Family Medicine

## 2023-05-25 ENCOUNTER — Other Ambulatory Visit: Payer: Self-pay | Admitting: *Deleted

## 2023-05-25 DIAGNOSIS — Z419 Encounter for procedure for purposes other than remedying health state, unspecified: Secondary | ICD-10-CM | POA: Diagnosis not present

## 2023-05-25 DIAGNOSIS — N401 Enlarged prostate with lower urinary tract symptoms: Secondary | ICD-10-CM

## 2023-05-26 ENCOUNTER — Ambulatory Visit: Payer: Medicaid Other | Admitting: Physician Assistant

## 2023-05-26 ENCOUNTER — Other Ambulatory Visit: Payer: Medicaid Other

## 2023-05-26 ENCOUNTER — Other Ambulatory Visit: Payer: Self-pay | Admitting: Family Medicine

## 2023-05-26 DIAGNOSIS — N401 Enlarged prostate with lower urinary tract symptoms: Secondary | ICD-10-CM

## 2023-05-26 DIAGNOSIS — N200 Calculus of kidney: Secondary | ICD-10-CM

## 2023-06-01 ENCOUNTER — Ambulatory Visit: Payer: Medicaid Other | Admitting: Physician Assistant

## 2023-06-01 ENCOUNTER — Other Ambulatory Visit: Payer: Medicaid Other

## 2023-06-01 ENCOUNTER — Telehealth: Payer: Self-pay

## 2023-06-01 NOTE — Telephone Encounter (Signed)
OK to add on for KUB/UA/PVR with me tomorrow.

## 2023-06-01 NOTE — Telephone Encounter (Signed)
Pt states he will not be able to make it tomorrow due to work. Appt made for 8/15 at 10:30. Pt voiced understanding

## 2023-06-01 NOTE — Telephone Encounter (Signed)
Pt states he feels like his kidney stone is moving and blocking his flow of urine. Pt states his urine stream is weak and he feels like he is not emptying his bladder well. Pt states no pain. Pt would to know if he should come in and be seen or have a X-Ray done.

## 2023-06-02 ENCOUNTER — Other Ambulatory Visit: Payer: Medicaid Other

## 2023-06-02 ENCOUNTER — Encounter: Payer: Self-pay | Admitting: Physician Assistant

## 2023-06-07 ENCOUNTER — Telehealth: Payer: Self-pay | Admitting: Physician Assistant

## 2023-06-07 ENCOUNTER — Other Ambulatory Visit: Payer: Self-pay

## 2023-06-07 DIAGNOSIS — N200 Calculus of kidney: Secondary | ICD-10-CM

## 2023-06-07 NOTE — Telephone Encounter (Signed)
When I called patient to advise about KUB prior to appt on 8/15, he asked that I let Sam know that he passed one stone this morning.

## 2023-06-08 ENCOUNTER — Ambulatory Visit
Admission: RE | Admit: 2023-06-08 | Discharge: 2023-06-08 | Disposition: A | Discharge status: Home / Self Care | Payer: Medicaid Other | Source: Ambulatory Visit | Attending: Physician Assistant | Admitting: Physician Assistant

## 2023-06-08 ENCOUNTER — Ambulatory Visit (INDEPENDENT_AMBULATORY_CARE_PROVIDER_SITE_OTHER): Payer: Medicaid Other | Admitting: Physician Assistant

## 2023-06-08 ENCOUNTER — Ambulatory Visit
Admission: RE | Admit: 2023-06-08 | Discharge: 2023-06-08 | Disposition: A | Discharge status: Home / Self Care | Payer: Medicaid Other | Attending: Physician Assistant | Admitting: Physician Assistant

## 2023-06-08 VITALS — BP 120/80 | HR 91 | Ht 68.0 in | Wt 138.8 lb

## 2023-06-08 DIAGNOSIS — R3911 Hesitancy of micturition: Secondary | ICD-10-CM | POA: Diagnosis not present

## 2023-06-08 DIAGNOSIS — R109 Unspecified abdominal pain: Secondary | ICD-10-CM

## 2023-06-08 DIAGNOSIS — N200 Calculus of kidney: Secondary | ICD-10-CM

## 2023-06-08 DIAGNOSIS — Z87442 Personal history of urinary calculi: Secondary | ICD-10-CM

## 2023-06-08 DIAGNOSIS — N401 Enlarged prostate with lower urinary tract symptoms: Secondary | ICD-10-CM | POA: Diagnosis not present

## 2023-06-08 LAB — URINALYSIS, COMPLETE
Bilirubin, UA: NEGATIVE
Glucose, UA: NEGATIVE
Ketones, UA: NEGATIVE
Leukocytes,UA: NEGATIVE
Nitrite, UA: NEGATIVE
Protein,UA: NEGATIVE
RBC, UA: NEGATIVE
Specific Gravity, UA: 1.015 (ref 1.005–1.030)
Urobilinogen, Ur: 4 mg/dL — ABNORMAL HIGH (ref 0.2–1.0)
pH, UA: 7 (ref 5.0–7.5)

## 2023-06-08 LAB — MICROSCOPIC EXAMINATION

## 2023-06-08 LAB — BLADDER SCAN AMB NON-IMAGING: Scan Result: 22

## 2023-06-08 NOTE — Progress Notes (Signed)
06/08/2023 10:14 AM   Troy [name] 07-11-1971 [id number]  CC: Chief Complaint  Patient presents with   Follow-up   Nephrolithiasis   HPI: Troy [name] is a 52 y.o. male with PMH recurrent nephrolithiasis, diabetes with gastroparesis, CVA, and BPH who presents today for evaluation of a possible acute stone episode.   I saw him in clinic most recently on 04/25/2023, at which point we elected to resume Flomax and potassium citrate.  Today he reports he has been taking Flomax and potassium citrate and tolerating these well.  He has noticed improvement in his LUTS on Flomax.  He developed some increased urinary hesitancy and urethral discomfort last week and ultimately passed a 2 to 3 mm stone yesterday morning.  His hesitancy/straining is progressively improving and he is not having any pain today.  KUB today with a stable 4 mm left lower pole stone.  In-office UA today positive for 4.0 EU/DL urobilinogen; urine microscopy with amorphous sediment and moderate bacteria.  PVR 22 mL.  PMH: Past Medical History:  Diagnosis Date   Asthma    Bipolar 1 disorder (HCC)    Diabetes mellitus without complication (HCC)    controlled by weight loss   GERD (gastroesophageal reflux disease)    History of CVA with residual deficit    HIV infection (HCC)    Hyperlipidemia 08/22/2022   Migraine headache    2-3 X/month   Nephrolithiasis    Wears dentures    full upper and lower    Surgical History: Past Surgical History:  Procedure Laterality Date   COLONOSCOPY     COLONOSCOPY WITH PROPOFOL N/A 05/30/2019   Procedure: COLONOSCOPY WITH PROPOFOL;  Surgeon: Toney Reil, MD;  Location: [facility] ENDOSCOPY;  Service: Gastroenterology;  Laterality: N/A;   COLONOSCOPY WITH PROPOFOL N/A 03/22/2023   Procedure: COLONOSCOPY WITH PROPOFOL;  Surgeon: Toney Reil, MD;  Location: [facility] ENDOSCOPY;  Service: Gastroenterology;  Laterality: N/A;   ELBOW SURGERY Left    fracture    ESOPHAGOGASTRODUODENOSCOPY (EGD) WITH PROPOFOL N/A 05/30/2019   Procedure: ESOPHAGOGASTRODUODENOSCOPY (EGD) WITH PROPOFOL;  Surgeon: Toney Reil, MD;  Location: [facility] ENDOSCOPY;  Service: Gastroenterology;  Laterality: N/A;   ESOPHAGOGASTRODUODENOSCOPY (EGD) WITH PROPOFOL N/A 09/12/2019   Procedure: ESOPHAGOGASTRODUODENOSCOPY (EGD) WITH PROPOFOL;  Surgeon: Toney Reil, MD;  Location: [facility] ENDOSCOPY;  Service: Gastroenterology;  Laterality: N/A;   ESOPHAGOGASTRODUODENOSCOPY (EGD) WITH PROPOFOL N/A 11/23/2022   Procedure: ESOPHAGOGASTRODUODENOSCOPY (EGD) WITH PROPOFOL;  Surgeon: Toney Reil, MD;  Location: [facility] ENDOSCOPY;  Service: Gastroenterology;  Laterality: N/A;   FACIAL COSMETIC SURGERY     GASTRIC BYPASS     HERNIA REPAIR     KIDNEY STONE SURGERY     renal artery repair      Home Medications:  Allergies as of 06/08/2023       Reactions   Niacin Anaphylaxis   Orange Oil Anaphylaxis   Zolpidem    Other reaction(s): Unknown Sleep walking and sleep eating        Medication List        Accurate as of June 08, 2023 10:14 AM. If you have any questions, ask your nurse or doctor.          albuterol 108 (90 Base) MCG/ACT inhaler Commonly known as: VENTOLIN HFA Inhale 2 puffs into the lungs every 6 (six) hours as needed for wheezing or shortness of breath.   ARIPiprazole 15 MG tablet Commonly known as: ABILIFY Take 1 tablet (15 mg total) by mouth daily.  atorvastatin 20 MG tablet Commonly known as: Lipitor Take 1 tablet (20 mg total) by mouth daily.   beclomethasone 80 MCG/ACT inhaler Commonly known as: QVAR Inhale 1 puff into the lungs 2 (two) times daily.   Biktarvy 50-200-25 MG Tabs tablet Generic drug: bictegravir-emtricitabine-tenofovir AF Take 1 tablet by mouth daily.   Breztri Aerosphere 160-9-4.8 MCG/ACT Aero Generic drug: Budeson-Glycopyrrol-Formoterol Inhale 2 puffs into the lungs 2 (two) times daily.   cetirizine 10 MG  tablet Commonly known as: ZYRTEC Take 1 tablet (10 mg total) by mouth daily.   Eszopiclone 3 MG Tabs Commonly known as: eszopiclone Take 1 tablet (3 mg total) by mouth at bedtime. Take immediately before bedtime   fluticasone 50 MCG/ACT nasal spray Commonly known as: FLONASE Place 2 sprays into both nostrils daily.   metoCLOPramide 5 MG tablet Commonly known as: Reglan Take 1 tablet (5 mg total) by mouth 4 (four) times daily -  before meals and at bedtime.   montelukast 10 MG tablet Commonly known as: SINGULAIR TAKE 1 TABLET BY MOUTH EVERY DAY   omeprazole 40 MG capsule Commonly known as: PRILOSEC Take 1 capsule (40 mg total) by mouth in the morning and at bedtime.   ondansetron 4 MG disintegrating tablet Commonly known as: ZOFRAN-ODT Take 1 tablet (4 mg total) by mouth every 8 (eight) hours as needed.   potassium citrate 10 MEQ (1080 MG) SR tablet Commonly known as: UROCIT-K Take 1 tablet (10 mEq total) by mouth 3 (three) times daily with meals.   prazosin 1 MG capsule Commonly known as: MINIPRESS Take 1 capsule (1 mg total) by mouth at bedtime.   sertraline 100 MG tablet Commonly known as: ZOLOFT Take 1 tablet (100 mg total) by mouth at bedtime.   SUMAtriptan 100 MG tablet Commonly known as: IMITREX TAKE ONE TABLET BY MOUTH AT ONSET OF THE HEADACHE. MAY REPEAT IN TWO HOURS   tamsulosin 0.4 MG Caps capsule Commonly known as: FLOMAX Take 1 capsule (0.4 mg total) by mouth daily.   tiZANidine 4 MG tablet Commonly known as: ZANAFLEX TAKE 1 TABLET BY MOUTH TWICE A DAY   topiramate 100 MG tablet Commonly known as: TOPAMAX TAKE 1 TABLET BY MOUTH TWICE A DAY        Allergies:  Allergies  Allergen Reactions   Niacin Anaphylaxis   Orange Oil Anaphylaxis   Zolpidem     Other reaction(s): Unknown Sleep walking and sleep eating    Family History: Family History  Problem Relation Age of Onset   Bipolar disorder Mother    COPD Mother    Diabetes Mother     Hypertension Mother    Hyperlipidemia Mother    Healthy Father    Cancer Father        skin   Stroke Brother    Heart disease Brother    Seizures Brother    Thyroid disease Brother    Diabetes Maternal Grandfather    Hyperlipidemia Maternal Grandfather    Cancer Maternal Grandmother        breast   Hyperlipidemia Maternal Grandmother    Hypertension Maternal Grandmother    Heart disease Paternal Grandfather    Hypertension Paternal Grandfather     Social History:   reports that he quit smoking about 11 years ago. His smoking use included cigarettes. He started smoking about 36 years ago. He has a 25 pack-year smoking history. He has never used smokeless tobacco. He reports that he does not currently use alcohol. He reports that he  does not currently use drugs.  Physical Exam: BP 120/80   Pulse 91   Ht 5\' 8"  (1.727 m)   Wt 138 lb 12.8 oz (63 kg)   BMI 21.10 kg/m   Constitutional:  Alert and oriented, no acute distress, nontoxic appearing HEENT: [place], AT Cardiovascular: No clubbing, cyanosis, or edema Respiratory: Normal respiratory effort, no increased work of breathing Skin: No rashes, bruises or suspicious lesions Neurologic: Grossly intact, no focal deficits, moving all 4 extremities Psychiatric: Normal mood and affect  Laboratory Data: Results for orders placed or performed in visit on 06/08/23  Microscopic Examination   Urine  Result Value Ref Range   WBC, UA 0-5 0 - 5 /hpf   RBC, Urine 0-2 0 - 2 /hpf   Epithelial Cells (non renal) 0-10 0 - 10 /hpf   Crystals Present (A) N/A   Crystal Type Amorphous Sediment N/A   Bacteria, UA Moderate (A) None seen/Few  Urinalysis, Complete  Result Value Ref Range   Specific Gravity, UA 1.015 1.005 - 1.030   pH, UA 7.0 5.0 - 7.5   Color, UA Yellow Yellow   Appearance Ur Clear Clear   Leukocytes,UA Negative Negative   Protein,UA Negative Negative/Trace   Glucose, UA Negative Negative   Ketones, UA Negative Negative   RBC,  UA Negative Negative   Bilirubin, UA Negative Negative   Urobilinogen, Ur 4.0 (H) 0.2 - 1.0 mg/dL   Nitrite, UA Negative Negative   Microscopic Examination See below:   BLADDER SCAN AMB NON-IMAGING  Result Value Ref Range   Scan Result 22 ml    Pertinent Imaging: KUB, 06/08/2023: CLINICAL DATA:  Kidney stones.   EXAM: ABDOMEN - 1 VIEW   COMPARISON:  CT 02/13/2023   FINDINGS: 3 mm stone in the lower left kidney. The previous right renal calculus on CT is not confidently seen. Postsurgical change in the upper and right abdomen with multiple surgical clips and enteric sutures. Nonobstructive bowel gas pattern, mild increased air within nondilated small bowel. Included lung bases are clear.   IMPRESSION: A 3 mm stone in the lower left kidney. The previous right renal calculus on CT is not confidently seen.     Electronically Signed   By: Narda Rutherford M.D.   On: 06/11/2023 11:35  I personally reviewed the images referenced above and note a 4 mm stable left lower pole stone.  Assessment & Plan:   1. Flank pain with history of urolithiasis He spontaneously passed a stone yesterday, no evidence of residual ureteral stones on KUB today and UA is bland.  He is tolerating potassium citrate 3 times daily despite gastroparesis.  Will obtain BMP today for serum potassium monitoring. - Urinalysis, Complete  2. Benign prostatic hyperplasia with urinary hesitancy Improvement in hesitancy on Flomax.  This acutely worsened in the setting of acute stone episode, which has now resolved.  Will continue Flomax and see him back in 6 months for BPH follow-up. - BLADDER SCAN AMB NON-IMAGING  Return in about 6 months (around 12/09/2023) for IPSS/PVR/PSA/DRE.  Carman Ching, PA-C  [facility] [place] [address], [address] [place], [place] [address] [phone]

## 2023-06-09 LAB — BASIC METABOLIC PANEL
BUN/Creatinine Ratio: 7 — ABNORMAL LOW (ref 9–20)
BUN: 8 mg/dL (ref 6–24)
CO2: 23 mmol/L (ref 20–29)
Calcium: 9.2 mg/dL (ref 8.7–10.2)
Chloride: 101 mmol/L (ref 96–106)
Creatinine, Ser: 1.11 mg/dL (ref 0.76–1.27)
Glucose: 88 mg/dL (ref 70–99)
Potassium: 4.1 mmol/L (ref 3.5–5.2)
Sodium: 137 mmol/L (ref 134–144)
eGFR: 81 mL/min/{1.73_m2} (ref >59)

## 2023-06-16 ENCOUNTER — Ambulatory Visit: Payer: Medicaid Other | Admitting: Family Medicine

## 2023-06-16 NOTE — Progress Notes (Deleted)
Acute Office Visit  Subjective:     Patient ID: Troy [name], male    DOB: 12-26-1970, 52 y.o.   MRN: [id number]  No chief complaint on file.   HPI Discussed the use of AI scribe software for clinical note transcription with the patient, who gave verbal consent to proceed.  History of Present Illness           ROS      Objective:    There were no vitals taken for this visit. {Vitals History (Optional):23777}  Physical Exam  No results found for any visits on 06/16/23.      Assessment & Plan:   Problem List Items Addressed This Visit   None                No orders of the defined types were placed in this encounter.   No follow-ups on file.  Shirlee Latch, MD

## 2023-06-19 ENCOUNTER — Telehealth (INDEPENDENT_AMBULATORY_CARE_PROVIDER_SITE_OTHER): Payer: Medicaid Other | Admitting: Family Medicine

## 2023-06-19 ENCOUNTER — Encounter: Payer: Self-pay | Admitting: Family Medicine

## 2023-06-19 DIAGNOSIS — L0100 Impetigo, unspecified: Secondary | ICD-10-CM

## 2023-06-19 DIAGNOSIS — B20 Human immunodeficiency virus [HIV] disease: Secondary | ICD-10-CM

## 2023-06-19 DIAGNOSIS — M25561 Pain in right knee: Secondary | ICD-10-CM

## 2023-06-19 DIAGNOSIS — G8929 Other chronic pain: Secondary | ICD-10-CM | POA: Insufficient documentation

## 2023-06-19 MED ORDER — MUPIROCIN 2 % EX OINT
1.0000 | TOPICAL_OINTMENT | Freq: Two times a day (BID) | CUTANEOUS | 0 refills | Status: DC
Start: 2023-06-19 — End: 2024-06-20

## 2023-06-19 MED ORDER — CEPHALEXIN 500 MG PO CAPS: 500.0000 mg | ORAL_CAPSULE | Freq: Four times a day (QID) | ORAL | 0 refills | Status: AC

## 2023-06-19 MED ORDER — NAPROXEN 375 MG PO TABS
375.0000 mg | ORAL_TABLET | Freq: Two times a day (BID) | ORAL | 0 refills | Status: DC
Start: 2023-06-19 — End: 2023-08-28

## 2023-06-19 MED ORDER — SULFAMETHOXAZOLE-TRIMETHOPRIM 800-160 MG PO TABS
1.0000 | ORAL_TABLET | Freq: Two times a day (BID) | ORAL | 0 refills | Status: AC
Start: 2023-06-19 — End: 2023-06-26

## 2023-06-19 NOTE — Patient Instructions (Addendum)
Take naproxen 375 mg tab twice per day (approx. 12 hours apart)  -  do NOT take ibuprofen, aleve, BC/goody powders, aspirin  Try to do at least one set of exercises twice a day every day.

## 2023-06-19 NOTE — Assessment & Plan Note (Signed)
Advised patient to take naproxen twice daily for the next 5 days and do at home exercises.  If patient does not experience improvement with the medication and exercises, will consider referral to physical therapy at that point.

## 2023-06-19 NOTE — Assessment & Plan Note (Signed)
Managed by infectious disease.  Patient endorses that he takes his medication as scheduled.  Will defer to ID management.

## 2023-06-19 NOTE — Progress Notes (Addendum)
Established patient visit   Patient: Troy [name]   DOB: 09/18/1971   52 y.o. Male  MRN: [id number] Visit Date: 06/19/2023  Today's healthcare provider: Sherlyn Hay, DO   Chief Complaint  Patient presents with   Medical Management of Chronic Issues    Potential staff infection under nose, no prior treatment, has been an issue for two weeks, yellow pus comes from bumps when squeezing   Subjective    HPI Sores under nose: Has had this before, was put on an antibiotic and it went away two weeks later. Current epsisode started two weeks ago. Has constant rhinorrhea; thinks it's related as source of lesions Patient also endorses oral lesions + history of MRSA   Right knee pain - works as a Production assistant, radio for walking Many years, getting worse, feels as though the pain goes to the bone.    Medications: Outpatient Medications Prior to Visit  Medication Sig   albuterol (VENTOLIN HFA) 108 (90 Base) MCG/ACT inhaler Inhale 2 puffs into the lungs every 6 (six) hours as needed for wheezing or shortness of breath.   ARIPiprazole (ABILIFY) 15 MG tablet Take 1 tablet (15 mg total) by mouth daily.   atorvastatin (LIPITOR) 20 MG tablet Take 1 tablet (20 mg total) by mouth daily.   beclomethasone (QVAR) 80 MCG/ACT inhaler Inhale 1 puff into the lungs 2 (two) times daily.   bictegravir-emtricitabine-tenofovir AF (BIKTARVY) 50-200-25 MG TABS tablet Take 1 tablet by mouth daily.   cetirizine (ZYRTEC) 10 MG tablet Take 1 tablet (10 mg total) by mouth daily.   eszopiclone 3 MG TABS Take 1 tablet (3 mg total) by mouth at bedtime. Take immediately before bedtime   fluticasone (FLONASE) 50 MCG/ACT nasal spray Place 2 sprays into both nostrils daily.   montelukast (SINGULAIR) 10 MG tablet TAKE 1 TABLET BY MOUTH EVERY DAY   omeprazole (PRILOSEC) 40 MG capsule Take 1 capsule (40 mg total) by mouth in the morning and at bedtime.   ondansetron (ZOFRAN-ODT) 4 MG disintegrating  tablet Take 1 tablet (4 mg total) by mouth every 8 (eight) hours as needed.   potassium citrate (UROCIT-K) 10 MEQ (1080 MG) SR tablet Take 1 tablet (10 mEq total) by mouth 3 (three) times daily with meals.   prazosin (MINIPRESS) 1 MG capsule Take 1 capsule (1 mg total) by mouth at bedtime.   sertraline (ZOLOFT) 100 MG tablet Take 1 tablet (100 mg total) by mouth at bedtime.   SUMAtriptan (IMITREX) 100 MG tablet TAKE ONE TABLET BY MOUTH AT ONSET OF THE HEADACHE. MAY REPEAT IN TWO HOURS   tamsulosin (FLOMAX) 0.4 MG CAPS capsule Take 1 capsule (0.4 mg total) by mouth daily.   tiZANidine (ZANAFLEX) 4 MG tablet TAKE 1 TABLET BY MOUTH TWICE A DAY   topiramate (TOPAMAX) 100 MG tablet TAKE 1 TABLET BY MOUTH TWICE A DAY   Budeson-Glycopyrrol-Formoterol (BREZTRI AEROSPHERE) 160-9-4.8 MCG/ACT AERO Inhale 2 puffs into the lungs 2 (two) times daily. (Patient not taking: Reported on 06/19/2023)   metoCLOPramide (REGLAN) 5 MG tablet Take 1 tablet (5 mg total) by mouth 4 (four) times daily -  before meals and at bedtime.   No facility-administered medications prior to visit.    Review of Systems  HENT:  Positive for rhinorrhea.   Respiratory: Negative.  Negative for cough, shortness of breath and wheezing.   Cardiovascular:  Negative for chest pain, palpitations and leg swelling.  Musculoskeletal:  Positive for arthralgias (right knee medially).  Neurological:  Negative for weakness and headaches.        Objective    There were no vitals taken for this visit.    Physical Exam Constitutional:      General: He is not in acute distress.    Appearance: Normal appearance. He is not diaphoretic.  HENT:     Head: Normocephalic.      Comments: Pustules present with crusting noted. Unable to appreciate oral lesion through video conference  Eyes:     Conjunctiva/sclera: Conjunctivae normal.  Pulmonary:     Effort: Pulmonary effort is normal. No respiratory distress.  Neurological:     Mental  Status: He is alert and oriented to person, place, and time. Mental status is at baseline.      No results found for any visits on 06/19/23.  Assessment & Plan    Chronic pain of right knee Assessment & Plan: Advised patient to take naproxen twice daily for the next 5 days and do at home exercises.  If patient does not experience improvement with the medication and exercises, will consider referral to physical therapy at that point.  Orders: -     Naproxen; Take 1 tablet (375 mg total) by mouth 2 (two) times daily with a meal.  Dispense: 10 tablet; Refill: 0  Impetigo vulgaris Assessment & Plan: Given patient's history of HIV, MRSA and endorsement of oral lesions, we will go ahead and treat patient's impetigo with Bactrim to address potential MRSA, cephalexin to address potential group A strep and mupirocin to address lesions under his nose.  Patient to follow-up if not improving.  Orders: -     Sulfamethoxazole-Trimethoprim; Take 1 tablet by mouth 2 (two) times daily for 7 days.  Dispense: 14 tablet; Refill: 0 -     Cephalexin; Take 1 capsule (500 mg total) by mouth 4 (four) times daily for 7 days.  Dispense: 28 capsule; Refill: 0 -     Mupirocin; Apply 1 Application topically 2 (two) times daily. For 10 days  Dispense: 22 g; Refill: 0  HIV disease (HCC) Assessment & Plan: Managed by infectious disease.  Patient endorses that he takes his medication as scheduled.  Will defer to ID management.    Return if symptoms worsen or fail to improve.      I discussed the assessment and treatment plan with the patient  The patient was provided an opportunity to ask questions and all were answered. The patient agreed with the plan and demonstrated an understanding of the instructions.   The patient was advised to call back or seek an in-person evaluation if the symptoms worsen or if the condition fails to improve as anticipated.    Sherlyn Hay, DO  [facility] [phone] (phone) [phone] (fax)  [facility]

## 2023-06-19 NOTE — Assessment & Plan Note (Signed)
Given patient's history of HIV, MRSA and endorsement of oral lesions, we will go ahead and treat patient's impetigo with Bactrim to address potential MRSA, cephalexin to address potential group A strep and mupirocin to address lesions under his nose.  Patient to follow-up if not improving.
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
## 2023-06-23 ENCOUNTER — Telehealth: Payer: Medicaid Other | Admitting: Psychiatry

## 2023-06-23 ENCOUNTER — Encounter: Payer: Self-pay | Admitting: Psychiatry

## 2023-06-23 DIAGNOSIS — R634 Abnormal weight loss: Secondary | ICD-10-CM

## 2023-06-23 DIAGNOSIS — F3131 Bipolar disorder, current episode depressed, mild: Secondary | ICD-10-CM

## 2023-06-23 DIAGNOSIS — G4700 Insomnia, unspecified: Secondary | ICD-10-CM | POA: Diagnosis not present

## 2023-06-23 DIAGNOSIS — F4310 Post-traumatic stress disorder, unspecified: Secondary | ICD-10-CM

## 2023-06-23 DIAGNOSIS — F1091 Alcohol use, unspecified, in remission: Secondary | ICD-10-CM

## 2023-06-23 NOTE — Patient Instructions (Addendum)
Continue Abilify 15 mg  Obtain EKG - please call [phone] or [phone] to make an appointment  Increase sertraline 150 mg at night  Continue Lunesta 3 mg at night as needed for sleep Discontinue prazosin - was on 1 mg Next appointment: 10/25 at 8 30

## 2023-06-25 DIAGNOSIS — Z419 Encounter for procedure for purposes other than remedying health state, unspecified: Secondary | ICD-10-CM | POA: Diagnosis not present

## 2023-06-27 NOTE — Progress Notes (Signed)
MyChart Video Visit    Virtual Visit via Video Note   This format is felt to be most appropriate for this patient at this time. Physical exam was limited by quality of the video and audio technology used for the visit.   Patient location: Home Provider location: [facility]  I discussed the limitations of evaluation and management by telemedicine and the availability of in person appointments. The patient expressed understanding and agreed to proceed.  Patient: Troy [name]   DOB: April 08, 1971   52 y.o. Male  MRN: [id number] Visit Date: 06/19/2023  Today's healthcare provider: Sherlyn Hay, DO   Chief Complaint  Patient presents with   Medical Management of Chronic Issues    Potential staff infection under nose, no prior treatment, has been an issue for two weeks, yellow pus comes from bumps when squeezing   Subjective    HPI HPI     Medical Management of Chronic Issues    Additional comments: Potential staff infection under nose, no prior treatment, has been an issue for two weeks, yellow pus comes from bumps when squeezing      Last edited by Rolly Salter, CMA on 06/19/2023 10:17 AM.     Sores under nose: Has had this before, was put on an antibiotic and it went away two weeks later. Current epsisode started two weeks ago. Has constant rhinorrhea; thinks it's related as source of lesions Patient also endorses oral lesions + history of MRSA     Right knee pain - works as a Production assistant, radio for walking Many years, getting worse, feels as though the pain goes to the bone.   Medications: Outpatient Medications Prior to Visit  Medication Sig   albuterol (VENTOLIN HFA) 108 (90 Base) MCG/ACT inhaler Inhale 2 puffs into the lungs every 6 (six) hours as needed for wheezing or shortness of breath.   ARIPiprazole (ABILIFY) 15 MG tablet Take 1 tablet (15 mg total) by mouth daily.   atorvastatin (LIPITOR) 20 MG tablet Take 1 tablet (20 mg  total) by mouth daily.   beclomethasone (QVAR) 80 MCG/ACT inhaler Inhale 1 puff into the lungs 2 (two) times daily.   bictegravir-emtricitabine-tenofovir AF (BIKTARVY) 50-200-25 MG TABS tablet Take 1 tablet by mouth daily.   cetirizine (ZYRTEC) 10 MG tablet Take 1 tablet (10 mg total) by mouth daily.   eszopiclone 3 MG TABS Take 1 tablet (3 mg total) by mouth at bedtime. Take immediately before bedtime   fluticasone (FLONASE) 50 MCG/ACT nasal spray Place 2 sprays into both nostrils daily.   montelukast (SINGULAIR) 10 MG tablet TAKE 1 TABLET BY MOUTH EVERY DAY   omeprazole (PRILOSEC) 40 MG capsule Take 1 capsule (40 mg total) by mouth in the morning and at bedtime.   ondansetron (ZOFRAN-ODT) 4 MG disintegrating tablet Take 1 tablet (4 mg total) by mouth every 8 (eight) hours as needed.   potassium citrate (UROCIT-K) 10 MEQ (1080 MG) SR tablet Take 1 tablet (10 mEq total) by mouth 3 (three) times daily with meals.   sertraline (ZOLOFT) 100 MG tablet Take 1 tablet (100 mg total) by mouth at bedtime. (Patient taking differently: Take 150 mg by mouth at bedtime.)   SUMAtriptan (IMITREX) 100 MG tablet TAKE ONE TABLET BY MOUTH AT ONSET OF THE HEADACHE. MAY REPEAT IN TWO HOURS   tamsulosin (FLOMAX) 0.4 MG CAPS capsule Take 1 capsule (0.4 mg total) by mouth daily.   tiZANidine (ZANAFLEX) 4 MG tablet TAKE 1 TABLET BY  MOUTH TWICE A DAY   topiramate (TOPAMAX) 100 MG tablet TAKE 1 TABLET BY MOUTH TWICE A DAY   [DISCONTINUED] prazosin (MINIPRESS) 1 MG capsule Take 1 capsule (1 mg total) by mouth at bedtime.   Budeson-Glycopyrrol-Formoterol (BREZTRI AEROSPHERE) 160-9-4.8 MCG/ACT AERO Inhale 2 puffs into the lungs 2 (two) times daily. (Patient not taking: Reported on 06/19/2023)   metoCLOPramide (REGLAN) 5 MG tablet Take 1 tablet (5 mg total) by mouth 4 (four) times daily -  before meals and at bedtime.   No facility-administered medications prior to visit.    Review of Systems HENT:  Positive for rhinorrhea.    Respiratory: Negative.  Negative for cough, shortness of breath and wheezing.   Cardiovascular:  Negative for chest pain, palpitations and leg swelling.  Musculoskeletal:  Positive for arthralgias (right knee medially).  Neurological:  Negative for weakness and headaches.        Objective    There were no vitals taken for this visit.      Physical Exam  Constitutional:      General: He is not in acute distress.    Appearance: Normal appearance. He is not diaphoretic.  HENT:     Head: Normocephalic.      Comments: Pustules present with crusting noted. Unable to appreciate oral lesion through video conference   Eyes:     Conjunctiva/sclera: Conjunctivae normal.  Pulmonary:     Effort: Pulmonary effort is normal. No respiratory distress.  Neurological:     Mental Status: He is alert and oriented to person, place, and time. Mental status is at baseline.      Assessment & Plan    Chronic pain of right knee Assessment & Plan: Advised patient to take naproxen twice daily for the next 5 days and do at home exercises.  If patient does not experience improvement with the medication and exercises, will consider referral to physical therapy at that point.  Orders: -     Naproxen; Take 1 tablet (375 mg total) by mouth 2 (two) times daily with a meal.  Dispense: 10 tablet; Refill: 0  Impetigo vulgaris Assessment & Plan: Given patient's history of HIV, MRSA and endorsement of oral lesions, we will go ahead and treat patient's impetigo with Bactrim to address potential MRSA, cephalexin to address potential group A strep and mupirocin to address lesions under his nose.  Patient to follow-up if not improving.  Orders: -     Sulfamethoxazole-Trimethoprim; Take 1 tablet by mouth 2 (two) times daily for 7 days.  Dispense: 14 tablet; Refill: 0 -     Cephalexin; Take 1 capsule (500 mg total) by mouth 4 (four) times daily for 7 days.  Dispense: 28 capsule; Refill: 0 -     Mupirocin;  Apply 1 Application topically 2 (two) times daily. For 10 days  Dispense: 22 g; Refill: 0  HIV disease (HCC) Assessment & Plan: Managed by infectious disease.  Patient endorses that he takes his medication as scheduled.  Will defer to ID management.      Return if symptoms worsen or fail to improve.     I discussed the assessment and treatment plan with the patient. The patient was provided an opportunity to ask questions and all were answered. The patient agreed with the plan and demonstrated an understanding of the instructions.   The patient was advised to call back or seek an in-person evaluation if the symptoms worsen or if the condition fails to improve as anticipated.  I provided 28  minutes of virtual-face-to-face time during this encounter.   Sherlyn Hay, DO [facility] [phone] (phone) [phone] (fax)  [facility]

## 2023-07-18 ENCOUNTER — Other Ambulatory Visit: Payer: Self-pay | Admitting: Family Medicine

## 2023-07-18 DIAGNOSIS — G8929 Other chronic pain: Secondary | ICD-10-CM

## 2023-07-25 DIAGNOSIS — Z419 Encounter for procedure for purposes other than remedying health state, unspecified: Secondary | ICD-10-CM | POA: Diagnosis not present
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
## 2023-08-13 NOTE — Progress Notes (Deleted)
Subjective:  Chief complaint    Patient ID: Troy [name], male    DOB: 1971/04/25, 52 y.o.   MRN: [id number]  HPI  52 year old who had been on [place] but was not taking it with food and was taking it with a proton pump inhibitor.  Fortunately was still suppressed we switched him over to [facility] in the interim.  He has remained undetectable.     Past Medical History:  Diagnosis Date   Asthma    Bipolar 1 disorder (HCC)    Diabetes mellitus without complication (HCC)    controlled by weight loss   GERD (gastroesophageal reflux disease)    History of CVA with residual deficit    HIV infection (HCC)    Hyperlipidemia 08/22/2022   Migraine headache    2-3 X/month   Nephrolithiasis    Wears dentures    full upper and lower    Past Surgical History:  Procedure Laterality Date   COLONOSCOPY     COLONOSCOPY WITH PROPOFOL N/A 05/30/2019   Procedure: COLONOSCOPY WITH PROPOFOL;  Surgeon: Toney Reil, MD;  Location: [facility] ENDOSCOPY;  Service: Gastroenterology;  Laterality: N/A;   COLONOSCOPY WITH PROPOFOL N/A 03/22/2023   Procedure: COLONOSCOPY WITH PROPOFOL;  Surgeon: Toney Reil, MD;  Location: [facility] ENDOSCOPY;  Service: Gastroenterology;  Laterality: N/A;   ELBOW SURGERY Left    fracture   ESOPHAGOGASTRODUODENOSCOPY (EGD) WITH PROPOFOL N/A 05/30/2019   Procedure: ESOPHAGOGASTRODUODENOSCOPY (EGD) WITH PROPOFOL;  Surgeon: Toney Reil, MD;  Location: [facility] ENDOSCOPY;  Service: Gastroenterology;  Laterality: N/A;   ESOPHAGOGASTRODUODENOSCOPY (EGD) WITH PROPOFOL N/A 09/12/2019   Procedure: ESOPHAGOGASTRODUODENOSCOPY (EGD) WITH PROPOFOL;  Surgeon: Toney Reil, MD;  Location: [facility] ENDOSCOPY;  Service: Gastroenterology;  Laterality: N/A;   ESOPHAGOGASTRODUODENOSCOPY (EGD) WITH PROPOFOL N/A 11/23/2022   Procedure: ESOPHAGOGASTRODUODENOSCOPY (EGD) WITH PROPOFOL;  Surgeon: Toney Reil, MD;  Location: [facility] ENDOSCOPY;  Service: Gastroenterology;  Laterality:  N/A;   FACIAL COSMETIC SURGERY     GASTRIC BYPASS     HERNIA REPAIR     KIDNEY STONE SURGERY     renal artery repair      Family History  Problem Relation Age of Onset   Bipolar disorder Mother    COPD Mother    Diabetes Mother    Hypertension Mother    Hyperlipidemia Mother    Healthy Father    Cancer Father        skin   Stroke Brother    Heart disease Brother    Seizures Brother    Thyroid disease Brother    Diabetes Maternal Grandfather    Hyperlipidemia Maternal Grandfather    Cancer Maternal Grandmother        breast   Hyperlipidemia Maternal Grandmother    Hypertension Maternal Grandmother    Heart disease Paternal Grandfather    Hypertension Paternal Grandfather       Social History   Socioeconomic History   Marital status: Single    Spouse name: Not on file   Number of children: 0   Years of education: Not on file   Highest education level: Some college, no degree  Occupational History   Occupation: IT sales professional: [employer]  Tobacco Use   Smoking status: Former    Current packs/day: 0.00    Average packs/day: 1 pack/day for 25.0 years (25.0 ttl pk-yrs)    Types: Cigarettes    Start date: 15    Quit date: 2013    Years since quitting:  11.8   Smokeless tobacco: Never   Tobacco comments:    Currently only vapes  Vaping Use   Vaping status: Every Day   Substances: Nicotine, Flavoring   Devices: Mod  Substance and Sexual Activity   Alcohol use: Not Currently    Comment: 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, active in [place]   Drug use: Not Currently    Comment: previously used, no IV drugs, 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   Sexual activity: Not Currently    Partners: Male    Comment: patient given condoms  Other Topics Concern   Not on file  Social History Narrative   Not on file   Social Determinants of Health   Financial Resource Strain: High Risk (04/18/2023)   Overall Financial Resource Strain (CARDIA)    Difficulty of Paying Living Expenses: Hard  Food  Insecurity: Food Insecurity Present (04/18/2023)   Hunger Vital Sign    Worried About Running Out of Food in the Last Year: Often true    Ran Out of Food in the Last Year: Often true  Transportation Needs: Unmet Transportation Needs (04/18/2023)   PRAPARE - Administrator, Civil Service (Medical): Yes    Lack of Transportation (Non-Medical): Yes  Physical Activity: Unknown (04/18/2023)   Exercise Vital Sign    Days of Exercise per Week: 0 days    Minutes of Exercise per Session: Not on file  Stress: Stress Concern Present (04/18/2023)   [employer] of Occupational Health - Occupational Stress Questionnaire    Feeling of Stress : Very much  Social Connections: Socially Isolated (04/18/2023)   Social Connection and Isolation Panel [NHANES]    Frequency of Communication with Friends and Family: Once a week    Frequency of Social Gatherings with Friends and Family: Never    Attends Religious Services: Never    Database administrator or Organizations: No    Attends Engineer, structural: Not on file    Marital Status: Divorced    Allergies  Allergen Reactions   Niacin Anaphylaxis   Orange Oil Anaphylaxis   Zolpidem     Other reaction(s): Unknown Sleep walking and sleep eating     Current Outpatient Medications:    albuterol (VENTOLIN HFA) 108 (90 Base) MCG/ACT inhaler, Inhale 2 puffs into the lungs every 6 (six) hours as needed for wheezing or shortness of breath., Disp: 8 g, Rfl: 2   ARIPiprazole (ABILIFY) 15 MG tablet, Take 1 tablet (15 mg total) by mouth daily., Disp: 90 tablet, Rfl: 0   atorvastatin (LIPITOR) 20 MG tablet, Take 1 tablet (20 mg total) by mouth daily., Disp: 30 tablet, Rfl: 11   beclomethasone (QVAR) 80 MCG/ACT inhaler, Inhale 1 puff into the lungs 2 (two) times daily., Disp: 1 each, Rfl: 2   bictegravir-emtricitabine-tenofovir AF (BIKTARVY) 50-200-25 MG TABS tablet, Take 1 tablet by mouth daily., Disp: 30 tablet, Rfl: 11    Budeson-Glycopyrrol-Formoterol (BREZTRI AEROSPHERE) 160-9-4.8 MCG/ACT AERO, Inhale 2 puffs into the lungs 2 (two) times daily. (Patient not taking: Reported on 06/19/2023), Disp: 10.7 g, Rfl: 11   cetirizine (ZYRTEC) 10 MG tablet, Take 1 tablet (10 mg total) by mouth daily., Disp: 90 tablet, Rfl: 3   eszopiclone 3 MG TABS, Take 1 tablet (3 mg total) by mouth at bedtime. Take immediately before bedtime, Disp: 30 tablet, Rfl: 1   fluticasone (FLONASE) 50 MCG/ACT nasal spray, Place 2 sprays into both nostrils daily., Disp: 16 g, Rfl: 6   metoCLOPramide (REGLAN) 5 MG tablet, Take 1 tablet (  5 mg total) by mouth 4 (four) times daily -  before meals and at bedtime., Disp: 120 tablet, Rfl: 0   montelukast (SINGULAIR) 10 MG tablet, TAKE 1 TABLET BY MOUTH EVERY DAY, Disp: 90 tablet, Rfl: 3   mupirocin ointment (BACTROBAN) 2 %, Apply 1 Application topically 2 (two) times daily. For 10 days, Disp: 22 g, Rfl: 0   naproxen (NAPROSYN) 375 MG tablet, Take 1 tablet (375 mg total) by mouth 2 (two) times daily with a meal., Disp: 10 tablet, Rfl: 0   omeprazole (PRILOSEC) 40 MG capsule, Take 1 capsule (40 mg total) by mouth in the morning and at bedtime., Disp: 180 capsule, Rfl: 0   ondansetron (ZOFRAN-ODT) 4 MG disintegrating tablet, Take 1 tablet (4 mg total) by mouth every 8 (eight) hours as needed., Disp: 30 tablet, Rfl: 0   potassium citrate (UROCIT-K) 10 MEQ (1080 MG) SR tablet, Take 1 tablet (10 mEq total) by mouth 3 (three) times daily with meals., Disp: 90 tablet, Rfl: 11   sertraline (ZOLOFT) 100 MG tablet, Take 1 tablet (100 mg total) by mouth at bedtime. (Patient taking differently: Take 150 mg by mouth at bedtime.), Disp: 90 tablet, Rfl: 0   SUMAtriptan (IMITREX) 100 MG tablet, TAKE ONE TABLET BY MOUTH AT ONSET OF THE HEADACHE. MAY REPEAT IN TWO HOURS, Disp: 10 tablet, Rfl: 3   tamsulosin (FLOMAX) 0.4 MG CAPS capsule, Take 1 capsule (0.4 mg total) by mouth daily., Disp: 30 capsule, Rfl: 11   tiZANidine  (ZANAFLEX) 4 MG tablet, TAKE 1 TABLET BY MOUTH TWICE A DAY, Disp: 180 tablet, Rfl: 0   topiramate (TOPAMAX) 100 MG tablet, TAKE 1 TABLET BY MOUTH TWICE A DAY, Disp: 180 tablet, Rfl: 3   Review of Systems     Objective:   Physical Exam        Assessment & Plan:   HIV   Hyperlipidemia  Depression  Vaccine counseling:   I am continuing patient's prescription for Biktarvy  Screening for anal pap smear: if this is abnormal will refer to [facility] for [facility]RA

## 2023-08-14 ENCOUNTER — Ambulatory Visit: Payer: Medicaid Other | Admitting: Infectious Disease

## 2023-08-14 DIAGNOSIS — E785 Hyperlipidemia, unspecified: Secondary | ICD-10-CM

## 2023-08-14 DIAGNOSIS — K449 Diaphragmatic hernia without obstruction or gangrene: Secondary | ICD-10-CM

## 2023-08-14 DIAGNOSIS — K2100 Gastro-esophageal reflux disease with esophagitis, without bleeding: Secondary | ICD-10-CM

## 2023-08-14 DIAGNOSIS — R3915 Urgency of urination: Secondary | ICD-10-CM

## 2023-08-14 DIAGNOSIS — I6930 Unspecified sequelae of cerebral infarction: Secondary | ICD-10-CM

## 2023-08-17 ENCOUNTER — Telehealth: Payer: Self-pay | Admitting: Gastroenterology

## 2023-08-17 ENCOUNTER — Other Ambulatory Visit: Payer: Self-pay

## 2023-08-17 DIAGNOSIS — R197 Diarrhea, unspecified: Secondary | ICD-10-CM

## 2023-08-17 NOTE — Telephone Encounter (Signed)
Spoke with pt regarding symptoms. Pt is having severe diarrhea. Advised the patient of the message below from Dr. Allegra Lai. Pt will go tomorrow, Friday, Oct 25th to [facility] to pick up stool containers. Pt advised we will contact him once results get back with next steps.   Not clear if patient is having severe constipation or severe diarrhea.  If he is having diarrhea, recommend stool studies GI profile PCR, C. difficile toxin A and B If he is having severe constipation, recommend cleanout with MiraLAX prep   RV

## 2023-08-17 NOTE — Telephone Encounter (Signed)
Not clear if patient is having severe constipation or severe diarrhea.  If he is having diarrhea, recommend stool studies GI profile PCR, C. difficile toxin A and B If he is having severe constipation, recommend cleanout with MiraLAX prep  RV

## 2023-08-17 NOTE — Telephone Encounter (Signed)
Patient is having a hard time controlling his bowels. He might eat once a day. His stomach hurts very bad and he don't know what to do. He said he feels like his system is shutting down and he is having trouble urinating this been going on 3 or 4 months it has got worst.

## 2023-08-18 ENCOUNTER — Encounter: Payer: Self-pay | Admitting: Psychiatry

## 2023-08-18 ENCOUNTER — Telehealth (INDEPENDENT_AMBULATORY_CARE_PROVIDER_SITE_OTHER): Payer: Medicaid Other | Admitting: Psychiatry

## 2023-08-18 DIAGNOSIS — F3131 Bipolar disorder, current episode depressed, mild: Secondary | ICD-10-CM | POA: Diagnosis not present

## 2023-08-18 DIAGNOSIS — F4310 Post-traumatic stress disorder, unspecified: Secondary | ICD-10-CM | POA: Diagnosis not present

## 2023-08-18 DIAGNOSIS — Z79899 Other long term (current) drug therapy: Secondary | ICD-10-CM | POA: Diagnosis not present

## 2023-08-18 DIAGNOSIS — G4700 Insomnia, unspecified: Secondary | ICD-10-CM | POA: Diagnosis not present

## 2023-08-18 MED ORDER — ARIPIPRAZOLE 15 MG PO TABS: 15.0000 mg | ORAL_TABLET | Freq: Every day | ORAL | 0 refills | Status: DC

## 2023-08-18 MED ORDER — ESZOPICLONE 3 MG PO TABS: 3.0000 mg | ORAL_TABLET | Freq: Every day | ORAL | 1 refills | Status: DC

## 2023-08-18 MED ORDER — SERTRALINE HCL 100 MG PO TABS: 150.0000 mg | ORAL_TABLET | Freq: Every day | ORAL | 0 refills | Status: DC

## 2023-08-18 MED ORDER — PRAZOSIN HCL 1 MG PO CAPS: 1.0000 mg | ORAL_CAPSULE | Freq: Every day | ORAL | 0 refills | Status: DC

## 2023-08-21 ENCOUNTER — Telehealth: Payer: Self-pay

## 2023-08-21 NOTE — Telephone Encounter (Signed)
eszopiclone was approved from 10-28 to 02-17-24

## 2023-08-21 NOTE — Telephone Encounter (Signed)
prior Berkley Harvey was submit and pending.

## 2023-08-21 NOTE — Telephone Encounter (Signed)
left message that eszopiclone was approved until 02-17-24

## 2023-08-21 NOTE — Telephone Encounter (Signed)
received fax that a prior auth is needed for the eszopiclone.

## 2023-08-25 DIAGNOSIS — Z419 Encounter for procedure for purposes other than remedying health state, unspecified: Secondary | ICD-10-CM | POA: Diagnosis not present

## 2023-08-28 ENCOUNTER — Other Ambulatory Visit: Payer: Self-pay | Admitting: Family Medicine

## 2023-08-28 ENCOUNTER — Other Ambulatory Visit: Payer: Self-pay

## 2023-08-28 ENCOUNTER — Telehealth: Payer: Self-pay | Admitting: Family Medicine

## 2023-08-28 DIAGNOSIS — G8929 Other chronic pain: Secondary | ICD-10-CM

## 2023-08-28 MED ORDER — TIZANIDINE HCL 4 MG PO TABS: 4.0000 mg | ORAL_TABLET | Freq: Two times a day (BID) | ORAL | 0 refills | Status: DC

## 2023-08-28 MED ORDER — NAPROXEN 375 MG PO TABS: 375.0000 mg | ORAL_TABLET | Freq: Two times a day (BID) | ORAL | 0 refills | Status: DC

## 2023-08-28 NOTE — Telephone Encounter (Addendum)
[facility] faxed refill request for the following medications:   sulfamethoxazole-trimethoprim (BACTRIM DS) 800-160 MG tablet     naproxen (NAPROSYN) 375 MG tablet      Please advise.

## 2023-08-28 NOTE — Telephone Encounter (Signed)
LOV  06/19/23

## 2023-08-28 NOTE — Telephone Encounter (Signed)
[facility] faxed refill request for the following medications:   tiZANidine (ZANAFLEX) 4 MG tablet    Please advise

## 2023-08-28 NOTE — Telephone Encounter (Signed)
[facility] faxed refill request for the following medications:    sulfamethoxazole-trimethoprim (BACTRIM DS) 800-160 MG tablet      naproxen (NAPROSYN) 375 MG tablet

## 2023-08-29 ENCOUNTER — Telehealth: Payer: Self-pay | Admitting: Family Medicine

## 2023-08-29 NOTE — Telephone Encounter (Signed)
[facility] faxed refill request for the following medications:   cephALEXin (KEFLEX) 500 MG capsule    Please advise.

## 2023-08-29 NOTE — Telephone Encounter (Signed)
Patient reports not sure why we received a refill request on this and to disregard.

## 2023-08-30 ENCOUNTER — Telehealth: Payer: Self-pay

## 2023-08-30 ENCOUNTER — Ambulatory Visit: Payer: Medicaid Other | Admitting: Infectious Disease

## 2023-08-30 NOTE — Telephone Encounter (Signed)
Receive a refill request for Metoclopramide and Suprep but patient is due for a follow up appointment. Patient no showed appointment on 03/29/2023 last appointment was a mychart video was 10/26/2022

## 2023-08-31 ENCOUNTER — Telehealth: Payer: Medicaid Other | Admitting: Family Medicine

## 2023-08-31 ENCOUNTER — Encounter: Payer: Self-pay | Admitting: Family Medicine

## 2023-08-31 DIAGNOSIS — M25561 Pain in right knee: Secondary | ICD-10-CM

## 2023-08-31 NOTE — Progress Notes (Signed)
MyChart Video Visit    Virtual Visit via Video Note   This format is felt to be most appropriate for this patient at this time. Physical exam was limited by quality of the video and audio technology used for the visit.    Patient location: home Provider location: [facility] Persons involved in the visit: patient, provider  I discussed the limitations of evaluation and management by telemedicine and the availability of in person appointments. The patient expressed understanding and agreed to proceed.  Patient: Troy [name]   DOB: 07-25-1971   52 y.o. Male  MRN: [id number] Visit Date: 08/31/2023  Today's healthcare provider: Shirlee Latch, MD   No chief complaint on file.  Subjective    HPI   Discussed the use of AI scribe software for clinical note transcription with the patient, who gave verbal consent to proceed.  History of Present Illness   The patient, with a history of right knee pain, presents for follow-up after a recent physical exam. They report worsening knee pain despite treatment with naproxen. The pain is located on the bone, on the side and top of the knee, and is particularly severe on the inside of the knee where the lower and upper leg bones meet. The pain has become so severe that they report difficulty walking. They deny any injury to the knee. The patient has not had any imaging studies of the knee and has not tried physical therapy. They express dissatisfaction with the naproxen, stating that it has not provided any relief.         Review of Systems      Objective    There were no vitals taken for this visit.      Physical Exam Constitutional:      General: He is not in acute distress.    Appearance: Normal appearance. He is not diaphoretic.  HENT:     Head: Normocephalic.  Eyes:     Conjunctiva/sclera: Conjunctivae normal.  Pulmonary:     Effort: Pulmonary effort is normal. No respiratory distress.  Neurological:      Mental Status: He is alert and oriented to person, place, and time. Mental status is at baseline.        Assessment & Plan     Problem List Items Addressed This Visit   None Visit Diagnoses     Acute pain of right knee    -  Primary   Relevant Orders   DG Knee Complete 4 Views Right   Ambulatory referral to Physical Therapy           Knee Pain, likely Osteoarthritis Chronic right knee pain, worsening over time, localized to the medial and superior aspects of the knee. Naproxen ineffective, pain significantly impairs mobility. No injury history. Differential includes osteoarthritis, meniscal injury, or other degenerative joint disease. On Biktarvy, necessitating caution with NSAIDs due to potential kidney damage. Discussed NSAID risks (gastrointestinal, kidney issues) and alternative pain management. X-ray results will guide further treatment. Discussed physical therapy and orthopedic referral; no preference expressed. - Order knee x-ray - Refer to physical therapy - Discontinue naproxen - Recommend extra strength acetaminophen, two tablets consistent use  Follow-up - Review x-ray results and determine further management - Coordinate with physical therapy for ongoing treatment.       Return if symptoms worsen or fail to improve.     I discussed the assessment and treatment plan with the patient. The patient was provided an opportunity to ask questions and  all were answered. The patient agreed with the plan and demonstrated an understanding of the instructions.   The patient was advised to call back or seek an in-person evaluation if the symptoms worsen or if the condition fails to improve as anticipated.   Shirlee Latch, MD [facility] [phone] (phone) [phone] (fax)  [facility]

## 2023-09-04 DIAGNOSIS — A084 Viral intestinal infection, unspecified: Secondary | ICD-10-CM | POA: Diagnosis not present

## 2023-09-13 NOTE — Progress Notes (Unsigned)
Subjective:  Chief complaint follow-up for HIV disease having been nonadherent to his medications recently   Patient ID: Troy [name], male    DOB: 06-10-1971, 52 y.o.   MRN: [id number]  HPI  Discussed the use of AI scribe software for clinical note transcription with the patient, who gave verbal consent to proceed.  History of Present Illness   The patient, with a history of HIV, asthma, and hyperlipidemia, presents for a routine follow-up. He reports inconsistent adherence to his medication regimen, including Biktarvy for HIV, albuterol for asthma, and atorvastatin for hyperlipidemia. He admits to missing approximately half of his doses recently, but denies any specific reason for this non-adherence, such as depression or substance use. He expresses a desire to improve his medication adherence moving forward.  The patient also reports taking several medications for mental health, including sertraline (Zoloft), Topamax, and Abilify. He denies current use of a steroid inhaler for asthma, despite acknowledging that it would likely improve his asthma control. He is followed by another provider for his asthma management.  In addition, the patient is being followed by a provider at [facility] for knee pain, which is currently being evaluated with x-rays and physical therapy.       Past Medical History:  Diagnosis Date   Asthma    Bipolar 1 disorder (HCC)    Diabetes mellitus without complication (HCC)    controlled by weight loss   GERD (gastroesophageal reflux disease)    History of CVA with residual deficit    HIV infection (HCC)    Hyperlipidemia 08/22/2022   Migraine headache    2-3 X/month   Nephrolithiasis    Wears dentures    full upper and lower    Past Surgical History:  Procedure Laterality Date   COLONOSCOPY     COLONOSCOPY WITH PROPOFOL N/A 05/30/2019   Procedure: COLONOSCOPY WITH PROPOFOL;  Surgeon: Toney Reil, MD;  Location: [facility] ENDOSCOPY;   Service: Gastroenterology;  Laterality: N/A;   COLONOSCOPY WITH PROPOFOL N/A 03/22/2023   Procedure: COLONOSCOPY WITH PROPOFOL;  Surgeon: Toney Reil, MD;  Location: [facility] ENDOSCOPY;  Service: Gastroenterology;  Laterality: N/A;   ELBOW SURGERY Left    fracture   ESOPHAGOGASTRODUODENOSCOPY (EGD) WITH PROPOFOL N/A 05/30/2019   Procedure: ESOPHAGOGASTRODUODENOSCOPY (EGD) WITH PROPOFOL;  Surgeon: Toney Reil, MD;  Location: [facility] ENDOSCOPY;  Service: Gastroenterology;  Laterality: N/A;   ESOPHAGOGASTRODUODENOSCOPY (EGD) WITH PROPOFOL N/A 09/12/2019   Procedure: ESOPHAGOGASTRODUODENOSCOPY (EGD) WITH PROPOFOL;  Surgeon: Toney Reil, MD;  Location: [facility] ENDOSCOPY;  Service: Gastroenterology;  Laterality: N/A;   ESOPHAGOGASTRODUODENOSCOPY (EGD) WITH PROPOFOL N/A 11/23/2022   Procedure: ESOPHAGOGASTRODUODENOSCOPY (EGD) WITH PROPOFOL;  Surgeon: Toney Reil, MD;  Location: [facility] ENDOSCOPY;  Service: Gastroenterology;  Laterality: N/A;   FACIAL COSMETIC SURGERY     GASTRIC BYPASS     HERNIA REPAIR     KIDNEY STONE SURGERY     renal artery repair      Family History  Problem Relation Age of Onset   Bipolar disorder Mother    COPD Mother    Diabetes Mother    Hypertension Mother    Hyperlipidemia Mother    Healthy Father    Cancer Father        skin   Stroke Brother    Heart disease Brother    Seizures Brother    Thyroid disease Brother    Diabetes Maternal Grandfather    Hyperlipidemia Maternal Grandfather    Cancer Maternal Grandmother  breast   Hyperlipidemia Maternal Grandmother    Hypertension Maternal Grandmother    Heart disease Paternal Grandfather    Hypertension Paternal Grandfather       Social History   Socioeconomic History   Marital status: Single    Spouse name: Not on file   Number of children: 0   Years of education: Not on file   Highest education level: Some college, no degree  Occupational History   Occupation: Art gallery manager: [employer]  Tobacco Use   Smoking status: Former    Current packs/day: 0.00    Average packs/day: 1 pack/day for 25.0 years (25.0 ttl pk-yrs)    Types: Cigarettes    Start date: 12    Quit date: 2013    Years since quitting: 11.8   Smokeless tobacco: Never   Tobacco comments:    Currently only vapes  Vaping Use   Vaping status: Every Day   Substances: Nicotine, Flavoring   Devices: Mod  Substance and Sexual Activity   Alcohol use: Not Currently    Comment: 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, active in [place]   Drug use: Not Currently    Comment: previously used, 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, 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   Sexual activity: Not Currently    Partners: Male    Comment: patient given condoms  Other Topics Concern   Not on file  Social History Narrative   Not on file   Social Determinants of Health   Financial Resource Strain: High Risk (04/18/2023)   Overall Financial Resource Strain (CARDIA)    Difficulty of Paying Living Expenses: Hard  Food Insecurity: Food Insecurity Present (04/18/2023)   Hunger Vital Sign    Worried About Running Out of Food in the Last Year: Often true    Ran Out of Food in the Last Year: Often true  Transportation Needs: Unmet Transportation Needs (04/18/2023)   PRAPARE - Administrator, Civil Service (Medical): Yes    Lack of Transportation (Non-Medical): Yes  Physical Activity: Unknown (04/18/2023)   Exercise Vital Sign    Days of Exercise per Week: 0 days    Minutes of Exercise per Session: Not on file  Stress: Stress Concern Present (04/18/2023)   [employer] of Occupational Health - Occupational Stress Questionnaire    Feeling of Stress : Very much  Social Connections: Socially Isolated (04/18/2023)   Social Connection and Isolation Panel [NHANES]    Frequency of Communication with Friends and Family: Once a week    Frequency of Social Gatherings with Friends and Family: Never    Attends Religious Services: Never    Database administrator or  Organizations: No    Attends Engineer, structural: Not on file    Marital Status: Divorced    Allergies  Allergen Reactions   Niacin Anaphylaxis   Orange Oil Anaphylaxis   Zolpidem     Other reaction(s): Unknown Sleep walking and sleep eating     Current Outpatient Medications:    albuterol (VENTOLIN HFA) 108 (90 Base) MCG/ACT inhaler, Inhale 2 puffs into the lungs every 6 (six) hours as needed for wheezing or shortness of breath., Disp: 8 g, Rfl: 2   ARIPiprazole (ABILIFY) 15 MG tablet, Take 1 tablet (15 mg total) by mouth daily., Disp: 90 tablet, Rfl: 0   atorvastatin (LIPITOR) 20 MG tablet, Take 1 tablet (20 mg total) by mouth daily., Disp: 30 tablet, Rfl: 11   beclomethasone (QVAR) 80 MCG/ACT inhaler, Inhale 1 puff into  the lungs 2 (two) times daily., Disp: 1 each, Rfl: 2   bictegravir-emtricitabine-tenofovir AF (BIKTARVY) 50-200-25 MG TABS tablet, Take 1 tablet by mouth daily., Disp: 30 tablet, Rfl: 11   Budeson-Glycopyrrol-Formoterol (BREZTRI AEROSPHERE) 160-9-4.8 MCG/ACT AERO, Inhale 2 puffs into the lungs 2 (two) times daily. (Patient not taking: Reported on 06/19/2023), Disp: 10.7 g, Rfl: 11   cetirizine (ZYRTEC) 10 MG tablet, Take 1 tablet (10 mg total) by mouth daily., Disp: 90 tablet, Rfl: 3   eszopiclone 3 MG TABS, Take 1 tablet (3 mg total) by mouth at bedtime. Take immediately before bedtime, Disp: 30 tablet, Rfl: 1   fluticasone (FLONASE) 50 MCG/ACT nasal spray, Place 2 sprays into both nostrils daily., Disp: 16 g, Rfl: 6   metoCLOPramide (REGLAN) 5 MG tablet, Take 1 tablet (5 mg total) by mouth 4 (four) times daily -  before meals and at bedtime., Disp: 120 tablet, Rfl: 0   montelukast (SINGULAIR) 10 MG tablet, TAKE 1 TABLET BY MOUTH EVERY DAY, Disp: 90 tablet, Rfl: 3   mupirocin ointment (BACTROBAN) 2 %, Apply 1 Application topically 2 (two) times daily. For 10 days, Disp: 22 g, Rfl: 0   omeprazole (PRILOSEC) 40 MG capsule, Take 1 capsule (40 mg total) by mouth  in the morning and at bedtime., Disp: 180 capsule, Rfl: 0   ondansetron (ZOFRAN-ODT) 4 MG disintegrating tablet, Take 1 tablet (4 mg total) by mouth every 8 (eight) hours as needed., Disp: 30 tablet, Rfl: 0   potassium citrate (UROCIT-K) 10 MEQ (1080 MG) SR tablet, Take 1 tablet (10 mEq total) by mouth 3 (three) times daily with meals., Disp: 90 tablet, Rfl: 11   prazosin (MINIPRESS) 1 MG capsule, Take 1 capsule (1 mg total) by mouth at bedtime., Disp: 90 capsule, Rfl: 0   sertraline (ZOLOFT) 100 MG tablet, Take 1.5 tablets (150 mg total) by mouth at bedtime., Disp: 135 tablet, Rfl: 0   SUMAtriptan (IMITREX) 100 MG tablet, TAKE ONE TABLET BY MOUTH AT ONSET OF THE HEADACHE. MAY REPEAT IN TWO HOURS, Disp: 10 tablet, Rfl: 3   tiZANidine (ZANAFLEX) 4 MG tablet, Take 1 tablet (4 mg total) by mouth 2 (two) times daily., Disp: 180 tablet, Rfl: 0   topiramate (TOPAMAX) 100 MG tablet, TAKE 1 TABLET BY MOUTH TWICE A DAY, Disp: 180 tablet, Rfl: 3   Review of Systems  Constitutional:  Negative for activity change, appetite change, chills, diaphoresis, fatigue, fever and unexpected weight change.  HENT:  Negative for congestion, rhinorrhea, sinus pressure, sneezing, sore throat and trouble swallowing.   Eyes:  Negative for photophobia and visual disturbance.  Respiratory:  Negative for cough, chest tightness, shortness of breath, wheezing and stridor.   Cardiovascular:  Negative for chest pain, palpitations and leg swelling.  Gastrointestinal:  Negative for abdominal distention, abdominal pain, anal bleeding, blood in stool, constipation, diarrhea, nausea and vomiting.  Genitourinary:  Negative for difficulty urinating, dysuria, flank pain and hematuria.  Musculoskeletal:  Negative for arthralgias, back pain, gait problem, joint swelling and myalgias.  Skin:  Negative for color change, pallor, rash and wound.  Neurological:  Negative for dizziness, tremors, weakness and light-headedness.  Hematological:   Negative for adenopathy. Does not bruise/bleed easily.  Psychiatric/Behavioral:  Negative for agitation, behavioral problems, confusion, decreased concentration, dysphoric mood and sleep disturbance.        Objective:   Physical Exam Constitutional:      Appearance: He is well-developed.  HENT:     Head: Normocephalic and atraumatic.  Eyes:  Conjunctiva/sclera: Conjunctivae normal.  Cardiovascular:     Rate and Rhythm: Normal rate and regular rhythm.  Pulmonary:     Effort: Pulmonary effort is normal. No respiratory distress.     Breath sounds: No wheezing.  Abdominal:     General: There is no distension.     Palpations: Abdomen is soft.  Musculoskeletal:        General: No tenderness. Normal range of motion.     Cervical back: Normal range of motion and neck supple.  Skin:    General: Skin is warm and dry.     Coloration: Skin is not pale.     Findings: No erythema or rash.  Neurological:     General: No focal deficit present.     Mental Status: He is alert and oriented to person, place, and time.  Psychiatric:        Attention and Perception: Attention normal.        Mood and Affect: Mood is depressed.        Speech: Speech normal.        Behavior: Behavior normal.        Thought Content: Thought content normal.        Cognition and Memory: Cognition and memory normal.        Judgment: Judgment normal.           Assessment & Plan:   Assessment and Plan    HIV Non-adherence to Biktarvy. Discussed the importance of consistent medication intake to prevent resistance. -Resume Biktarvy daily. -check HIV RNA w reflex to genotype< CD4 -Change to mail order pharmacy [facility] for better medication adherence monitoring.  Asthma Non-adherence to steroid inhaler. Discussed the importance of consistent inhaler use for better asthma control. -Encouraged to resume steroid inhaler. and albuterol  Hyperlipidemia Non-adherence to Atorvastatin. Discussed the  importance of lipid control for cardiovascular health. -Encouraged to resume Atorvastatin.  Depression On Zoloft, Topamax, and Abilify. No changes discussed.  General Health Maintenance -Administer influenza and COVID-19 vaccines today. -Continue with anal cancer screening as previously done.

## 2023-09-14 ENCOUNTER — Other Ambulatory Visit: Payer: Self-pay

## 2023-09-14 ENCOUNTER — Telehealth: Payer: Medicaid Other | Admitting: Family Medicine

## 2023-09-14 ENCOUNTER — Encounter: Payer: Self-pay | Admitting: Infectious Disease

## 2023-09-14 ENCOUNTER — Other Ambulatory Visit: Payer: Self-pay | Admitting: Pharmacist

## 2023-09-14 ENCOUNTER — Other Ambulatory Visit (HOSPITAL_COMMUNITY)
Admission: RE | Admit: 2023-09-14 | Discharge: 2023-09-14 | Disposition: A | Discharge status: Home / Self Care | Payer: Medicaid Other | Source: Ambulatory Visit | Attending: Infectious Disease | Admitting: Infectious Disease

## 2023-09-14 ENCOUNTER — Ambulatory Visit (INDEPENDENT_AMBULATORY_CARE_PROVIDER_SITE_OTHER): Payer: Medicaid Other | Admitting: Infectious Disease

## 2023-09-14 ENCOUNTER — Other Ambulatory Visit (HOSPITAL_COMMUNITY): Payer: Self-pay

## 2023-09-14 VITALS — BP 110/80 | HR 82 | Temp 98.2°F | Resp 16 | Wt 141.6 lb

## 2023-09-14 DIAGNOSIS — Z23 Encounter for immunization: Secondary | ICD-10-CM

## 2023-09-14 DIAGNOSIS — Z7185 Encounter for immunization safety counseling: Secondary | ICD-10-CM

## 2023-09-14 DIAGNOSIS — R3915 Urgency of urination: Secondary | ICD-10-CM

## 2023-09-14 DIAGNOSIS — J4520 Mild intermittent asthma, uncomplicated: Secondary | ICD-10-CM

## 2023-09-14 DIAGNOSIS — J45909 Unspecified asthma, uncomplicated: Secondary | ICD-10-CM

## 2023-09-14 DIAGNOSIS — F319 Bipolar disorder, unspecified: Secondary | ICD-10-CM

## 2023-09-14 DIAGNOSIS — B20 Human immunodeficiency virus [HIV] disease: Secondary | ICD-10-CM | POA: Insufficient documentation

## 2023-09-14 DIAGNOSIS — E785 Hyperlipidemia, unspecified: Secondary | ICD-10-CM | POA: Diagnosis not present

## 2023-09-14 MED ORDER — BIKTARVY 50-200-25 MG PO TABS
1.0000 | ORAL_TABLET | Freq: Every day | ORAL | 11 refills | Status: DC
  Filled 2023-09-14 – 2023-09-19 (×2): qty 30, 30d supply, fill #0
  Filled 2023-10-19: qty 30, 30d supply, fill #1
  Filled 2023-11-13: qty 30, 30d supply, fill #2
  Filled 2023-12-04: qty 30, 30d supply, fill #3
  Filled 2024-02-13: qty 30, 30d supply, fill #4
  Filled 2024-03-19: qty 30, 30d supply, fill #5
  Filled 2024-04-16: qty 30, 30d supply, fill #6
  Filled 2024-05-24 – 2024-05-29 (×2): qty 30, 30d supply, fill #7
  Filled 2024-06-27: qty 30, 30d supply, fill #8

## 2023-09-14 MED ORDER — ATORVASTATIN CALCIUM 20 MG PO TABS
20.0000 mg | ORAL_TABLET | Freq: Every day | ORAL | 11 refills | Status: DC
  Filled 2023-09-14: qty 30, 30d supply, fill #0

## 2023-09-14 NOTE — Progress Notes (Signed)
Specialty Pharmacy Initiation Note   Troy [name] is a 52 y.o. male who will be followed by the specialty pharmacy service for RxSp HIV    Review of administration, indication, effectiveness, safety, potential side effects, storage/disposable, and missed dose instructions occurred today for patient's specialty medication(s) Bictegravir-Emtricitab-Tenofov     Patient/Caregiver did not have any additional questions or concerns.   Patient's therapy is appropriate to: Continue    Goals Addressed             This Visit's Progress    Achieve Undetectable HIV Viral Load < 20       Patient is on track. Patient will work on increased adherence      Comply with lab assessments       Patient is on track. Patient will adhere to provider and/or lab appointments      Increase CD4 count until steady state       Patient is on track. Patient will maintain adherence      Maintain optimal adherence to therapy       Patient is not on track and no change. Patient will work on increased adherence         Jennette Kettle Specialty Pharmacist

## 2023-09-14 NOTE — Progress Notes (Signed)
Specialty Pharmacy Initial Fill Coordination Note  Troy [name] is a 52 y.o. male contacted today regarding refills of specialty medication(s) Bictegravir-Emtricitab-Tenofov   Patient requested Delivery   Delivery date: 09/20/23   Verified address: [address] [place] [address]   Medication will be filled on 09/19/23.   Patient is aware of $0 copayment.

## 2023-09-15 ENCOUNTER — Encounter: Payer: Self-pay | Admitting: Infectious Disease

## 2023-09-15 LAB — URINE CYTOLOGY ANCILLARY ONLY
Chlamydia: NEGATIVE
Comment: NEGATIVE
Comment: NORMAL
Neisseria Gonorrhea: NEGATIVE

## 2023-09-15 LAB — T-HELPER CELLS (CD4) COUNT (NOT AT ARMC)
CD4 % Helper T Cell: 28 % — ABNORMAL LOW (ref 33–65)
CD4 T Cell Abs: 524 /uL (ref 400–1790)

## 2023-09-17 LAB — CBC WITH DIFFERENTIAL/PLATELET
Absolute Lymphocytes: 2007 {cells}/uL (ref 850–3900)
Absolute Monocytes: 418 {cells}/uL (ref 200–950)
Basophils Absolute: 28 {cells}/uL (ref 0–200)
Basophils Relative: 0.6 %
Eosinophils Absolute: 19 {cells}/uL (ref 15–500)
Eosinophils Relative: 0.4 %
HCT: 42.5 % (ref 38.5–50.0)
Hemoglobin: 14.4 g/dL (ref 13.2–17.1)
MCH: 31.4 pg (ref 27.0–33.0)
MCHC: 33.9 g/dL (ref 32.0–36.0)
MCV: 92.8 fL (ref 80.0–100.0)
MPV: 10.2 fL (ref 7.5–12.5)
Monocytes Relative: 8.9 %
Neutro Abs: 2228 {cells}/uL (ref 1500–7800)
Neutrophils Relative %: 47.4 %
Platelets: 190 10*3/uL (ref 140–400)
RBC: 4.58 10*6/uL (ref 4.20–5.80)
RDW: 13.9 % (ref 11.0–15.0)
Total Lymphocyte: 42.7 %
WBC: 4.7 10*3/uL (ref 3.8–10.8)

## 2023-09-17 LAB — LIPID PANEL
Cholesterol: 146 mg/dL (ref <200)
HDL: 48 mg/dL (ref >=40)
LDL Cholesterol (Calc): 84 mg/dL
Non-HDL Cholesterol (Calc): 98 mg/dL (ref <130)
Total CHOL/HDL Ratio: 3 (calc) (ref <5.0)
Triglycerides: 68 mg/dL (ref <150)

## 2023-09-17 LAB — COMPLETE METABOLIC PANEL WITH GFR
AG Ratio: 1.7 (calc) (ref 1.0–2.5)
ALT: 12 U/L (ref 9–46)
AST: 15 U/L (ref 10–35)
Albumin: 4.1 g/dL (ref 3.6–5.1)
Alkaline phosphatase (APISO): 51 U/L (ref 35–144)
BUN: 12 mg/dL (ref 7–25)
CO2: 25 mmol/L (ref 20–32)
Calcium: 9.2 mg/dL (ref 8.6–10.3)
Chloride: 106 mmol/L (ref 98–110)
Creat: 1.23 mg/dL (ref 0.70–1.30)
Globulin: 2.4 g/dL (ref 1.9–3.7)
Glucose, Bld: 93 mg/dL (ref 65–99)
Potassium: 3.9 mmol/L (ref 3.5–5.3)
Sodium: 138 mmol/L (ref 135–146)
Total Bilirubin: 0.5 mg/dL (ref 0.2–1.2)
Total Protein: 6.5 g/dL (ref 6.1–8.1)
eGFR: 72 mL/min/{1.73_m2} (ref >=60)

## 2023-09-17 LAB — RPR: RPR Ser Ql: NONREACTIVE

## 2023-09-17 LAB — HIV RNA, RTPCR W/R GT (RTI, PI,INT)
HIV 1 RNA Quant: <20 {copies}/mL — AB
HIV-1 RNA Quant, Log: <1.3 {Log} — AB

## 2023-09-18 ENCOUNTER — Ambulatory Visit
Admission: RE | Admit: 2023-09-18 | Discharge: 2023-09-18 | Disposition: A | Discharge status: Home / Self Care | Payer: Medicaid Other | Source: Ambulatory Visit | Attending: Family Medicine | Admitting: Family Medicine

## 2023-09-18 ENCOUNTER — Ambulatory Visit
Admission: RE | Admit: 2023-09-18 | Discharge: 2023-09-18 | Disposition: A | Discharge status: Home / Self Care | Payer: Medicaid Other | Attending: Family Medicine | Admitting: Family Medicine

## 2023-09-18 ENCOUNTER — Ambulatory Visit (INDEPENDENT_AMBULATORY_CARE_PROVIDER_SITE_OTHER): Payer: Medicaid Other | Admitting: Licensed Clinical Social Worker

## 2023-09-18 DIAGNOSIS — M25561 Pain in right knee: Secondary | ICD-10-CM | POA: Insufficient documentation

## 2023-09-18 DIAGNOSIS — Z7689 Persons encountering health services in other specified circumstances: Secondary | ICD-10-CM | POA: Diagnosis not present

## 2023-09-18 DIAGNOSIS — Z91199 Patient's noncompliance with other medical treatment and regimen due to unspecified reason: Secondary | ICD-10-CM

## 2023-09-18 DIAGNOSIS — M1711 Unilateral primary osteoarthritis, right knee: Secondary | ICD-10-CM | POA: Diagnosis not present

## 2023-09-18 NOTE — Progress Notes (Signed)
Clinician attempted session via face-to-face, but Troy [name] did not appear for his session.

## 2023-09-19 ENCOUNTER — Other Ambulatory Visit: Payer: Self-pay

## 2023-09-19 ENCOUNTER — Other Ambulatory Visit (HOSPITAL_COMMUNITY): Payer: Self-pay

## 2023-09-23 ENCOUNTER — Other Ambulatory Visit (HOSPITAL_COMMUNITY): Payer: Self-pay

## 2023-09-24 DIAGNOSIS — Z419 Encounter for procedure for purposes other than remedying health state, unspecified: Secondary | ICD-10-CM | POA: Diagnosis not present
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
## 2023-10-04 ENCOUNTER — Other Ambulatory Visit: Payer: Self-pay

## 2023-10-05 ENCOUNTER — Ambulatory Visit (INDEPENDENT_AMBULATORY_CARE_PROVIDER_SITE_OTHER): Payer: Medicaid Other | Admitting: Professional Counselor

## 2023-10-05 DIAGNOSIS — Z7689 Persons encountering health services in other specified circumstances: Secondary | ICD-10-CM | POA: Diagnosis not present

## 2023-10-05 DIAGNOSIS — F4310 Post-traumatic stress disorder, unspecified: Secondary | ICD-10-CM

## 2023-10-05 DIAGNOSIS — F1220 Cannabis dependence, uncomplicated: Secondary | ICD-10-CM | POA: Diagnosis not present

## 2023-10-05 DIAGNOSIS — F319 Bipolar disorder, unspecified: Secondary | ICD-10-CM | POA: Diagnosis not present
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
## 2023-10-06 ENCOUNTER — Encounter: Payer: Self-pay | Admitting: Psychiatry

## 2023-10-06 ENCOUNTER — Telehealth (INDEPENDENT_AMBULATORY_CARE_PROVIDER_SITE_OTHER): Payer: Medicaid Other | Admitting: Psychiatry

## 2023-10-06 DIAGNOSIS — F4310 Post-traumatic stress disorder, unspecified: Secondary | ICD-10-CM | POA: Diagnosis not present

## 2023-10-06 DIAGNOSIS — F319 Bipolar disorder, unspecified: Secondary | ICD-10-CM | POA: Diagnosis not present

## 2023-10-06 DIAGNOSIS — G4700 Insomnia, unspecified: Secondary | ICD-10-CM | POA: Diagnosis not present

## 2023-10-06 MED ORDER — ARIPIPRAZOLE 15 MG PO TABS: 15.0000 mg | ORAL_TABLET | Freq: Every day | ORAL | 0 refills | Status: DC

## 2023-10-06 NOTE — Patient Instructions (Signed)
Continue Abilify 15 mg  Obtain EKG - please call [phone] to make an appointment  Continue sertraline 150 mg at night  Continue Lunesta 3 mg at night as needed for sleep Discontinue prazosin- was on 1 mg Next appointment: 1/31 at 8 40

## 2023-10-09 ENCOUNTER — Ambulatory Visit: Payer: Medicaid Other | Admitting: Professional Counselor

## 2023-10-19 ENCOUNTER — Other Ambulatory Visit: Payer: Self-pay

## 2023-10-19 ENCOUNTER — Ambulatory Visit: Payer: Medicaid Other | Admitting: Professional Counselor

## 2023-10-19 ENCOUNTER — Other Ambulatory Visit (HOSPITAL_COMMUNITY): Payer: Self-pay

## 2023-10-19 DIAGNOSIS — Z7689 Persons encountering health services in other specified circumstances: Secondary | ICD-10-CM | POA: Diagnosis not present

## 2023-10-19 DIAGNOSIS — F319 Bipolar disorder, unspecified: Secondary | ICD-10-CM | POA: Diagnosis not present

## 2023-10-19 NOTE — Progress Notes (Signed)
Specialty Pharmacy Refill Coordination Note  Troy [name] is a 52 y.o. male contacted today regarding refills of specialty medication(s) Bictegravir-Emtricitab-Tenofov [name]   Patient requested Delivery   Delivery date: 10/20/23   Verified address: [address], [place], [place]   Medication will be filled on 10/19/23.

## 2023-10-19 NOTE — Progress Notes (Signed)
   THERAPIST PROGRESS NOTE  Session Time: 8:10 AM - 8:55 AM  Participation Level: Active  Behavioral Response: Fairly Groomed, Alert, Euthymic  Type of Therapy: Individual Therapy  Treatment Goals addressed: Active  BH CCP BIPOLAR DISORDER-MANIA/HYPOMANIA   LTG: "I want to raise the value of myself because I don't think I value enough. I want to raise my standards in men, but that comes back to my value because that's why I date bad boys."   STG: Carr "Jodelle Red" will identify cognitive patterns and beliefs that contribute to symptoms and work towards restructuring those beliefs over the next 12 weeks.   STG: "I want to work on the PTSD." Jodelle Red will reduce the impact of trauma as evidenced by processing the event, identifying stuck points, and restructuring unhealthy thinking patterns around it over the next 12 weeks.    Yamil "Jodelle Red" will identify his self-destructive behaviors (such as promiscuity, substance abuse, and aggression) and implement behavior change techniques over the next 12 weeks.   ProgressTowards Goals: Initial  Interventions: CBT, Motivational Interviewing, and Supportive  Conducted face-to-face session with [employer]. Utilized open-ended questions to facilitate discussion and noted updates since previous session. Used empathetic and reflective listening. Began to identify concerns and developed treatment plan with input from [facility] on current strengths, needs, and progress towards goals. Encouraged [name] to begin a thought log to keep track of negative thoughts about himself and internalized messages he received from his parents. Concluded session with scheduling follow-up appointment.  Summary: Jodelle Red is a 52 y.o. male who presents with bipolar disorder. He reports he spent the holiday with family. He notes his father told him he's "not worth a thing." Jodelle Red reports this has been ongoing since he came out as a teenager. He states he did not respond to his father as he did not  want to argue and ruin the holiday. However, he note this is his typical response. [name] expressed concerns with being out of work and fear his roommate won't continue to provide financial support. He discussed an interaction with a recent partner. Jodelle Red also discussed ongoing patterns of promiscuity. He processed some potential triggers for this behavior. [name] assisted with identifying treatment goals and was in agreement to complete homework assignment.   Suicidal/Homicidal: No  Plan: Return again in three weeks.  Diagnosis: Bipolar 1 disorder  Collaboration of Care: Medication Management AEB chart review  Patient/Guardian was advised Release of Information must be obtained prior to any record release in order to collaborate their care with an outside provider. Patient/Guardian was advised if they have not already done so to contact the registration department to sign all necessary forms in order for [place] to release information regarding their care.   Consent: Patient/Guardian gives verbal consent for treatment and assignment of benefits for services provided during this visit. Patient/Guardian expressed understanding and agreed to proceed.   Edmonia Lynch, [facility] 10/19/2023

## 2023-10-20 ENCOUNTER — Other Ambulatory Visit (HOSPITAL_COMMUNITY): Payer: Self-pay

## 2023-10-20 ENCOUNTER — Other Ambulatory Visit: Payer: Self-pay

## 2023-10-25 DIAGNOSIS — Z419 Encounter for procedure for purposes other than remedying health state, unspecified: Secondary | ICD-10-CM | POA: Diagnosis not present

## 2023-11-06 ENCOUNTER — Ambulatory Visit (INDEPENDENT_AMBULATORY_CARE_PROVIDER_SITE_OTHER): Payer: Medicaid Other | Admitting: Professional Counselor

## 2023-11-06 DIAGNOSIS — Z91199 Patient's noncompliance with other medical treatment and regimen due to unspecified reason: Secondary | ICD-10-CM

## 2023-11-07 NOTE — Progress Notes (Signed)
 Patient no-showed today's appointment; appointment was for 11/06/23 at 11 AM for follow-up therapy.

## 2023-11-09 ENCOUNTER — Other Ambulatory Visit (HOSPITAL_COMMUNITY): Payer: Self-pay

## 2023-11-12 ENCOUNTER — Other Ambulatory Visit: Payer: Self-pay | Admitting: Psychiatry
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
## 2023-11-13 ENCOUNTER — Other Ambulatory Visit: Payer: Self-pay

## 2023-11-13 NOTE — Progress Notes (Signed)
Specialty Pharmacy Refill Coordination Note  Troy [name] is a 53 y.o. male contacted today regarding refills of specialty medication(s) Bictegravir-Emtricitab-Tenofov [name]   Patient requested Delivery   Delivery date: 11/14/23   Verified address: [address] [place] [address]   Medication will be filled on 11/13/23.

## 2023-11-14 NOTE — Progress Notes (Deleted)
Subjective:   Chief complaint: Follow-up for HIV disease on medications   Patient ID: Troy [name], male    DOB: [birth year], 53 y.o.   MRN: [id number]  HPI  Past Medical History:  Diagnosis Date   Asthma    Bipolar 1 disorder (HCC)    Diabetes mellitus without complication (HCC)    controlled by weight loss   GERD (gastroesophageal reflux disease)    History of CVA with residual deficit    HIV infection (HCC)    Hyperlipidemia 08/22/2022   Migraine headache    2-3 X/month   Nephrolithiasis    Wears dentures    full upper and lower    Past Surgical History:  Procedure Laterality Date   COLONOSCOPY     COLONOSCOPY WITH PROPOFOL N/A 05/30/2019   Procedure: COLONOSCOPY WITH PROPOFOL;  Surgeon: Toney Reil, MD;  Location: [facility] ENDOSCOPY;  Service: Gastroenterology;  Laterality: N/A;   COLONOSCOPY WITH PROPOFOL N/A 03/22/2023   Procedure: COLONOSCOPY WITH PROPOFOL;  Surgeon: Toney Reil, MD;  Location: [facility] ENDOSCOPY;  Service: Gastroenterology;  Laterality: N/A;   ELBOW SURGERY Left    fracture   ESOPHAGOGASTRODUODENOSCOPY (EGD) WITH PROPOFOL N/A 05/30/2019   Procedure: ESOPHAGOGASTRODUODENOSCOPY (EGD) WITH PROPOFOL;  Surgeon: Toney Reil, MD;  Location: [facility] ENDOSCOPY;  Service: Gastroenterology;  Laterality: N/A;   ESOPHAGOGASTRODUODENOSCOPY (EGD) WITH PROPOFOL N/A 09/12/2019   Procedure: ESOPHAGOGASTRODUODENOSCOPY (EGD) WITH PROPOFOL;  Surgeon: Toney Reil, MD;  Location: [facility] ENDOSCOPY;  Service: Gastroenterology;  Laterality: N/A;   ESOPHAGOGASTRODUODENOSCOPY (EGD) WITH PROPOFOL N/A 11/23/2022   Procedure: ESOPHAGOGASTRODUODENOSCOPY (EGD) WITH PROPOFOL;  Surgeon: Toney Reil, MD;  Location[facility] ENDOSCOPY;  Service: Gastroenterology;  Laterality: N/A;   FACIAL COSMETIC SURGERY     GASTRIC BYPASS     HERNIA REPAIR     KIDNEY STONE SURGERY     renal artery repair      Family History  Problem Relation Age of Onset   Bipolar disorder  Mother    COPD Mother    Diabetes Mother    Hypertension Mother    Hyperlipidemia Mother    Healthy Father    Cancer Father        skin   Stroke Brother    Heart disease Brother    Seizures Brother    Thyroid disease Brother    Diabetes Maternal Grandfather    Hyperlipidemia Maternal Grandfather    Cancer Maternal Grandmother        breast   Hyperlipidemia Maternal Grandmother    Hypertension Maternal Grandmother    Heart disease Paternal Grandfather    Hypertension Paternal Grandfather       Social History   Socioeconomic History   Marital status: Single    Spouse name: Not on file   Number of children: 0   Years of education: Not on file   Highest education level: Some college, no degree  Occupational History   Occupation: IT sales professional: [employer]  Tobacco Use   Smoking status: Every Day    Types: E-cigarettes   Smokeless tobacco: Never   Tobacco comments:    Currently only vapes  Vaping Use   Vaping status: Every Day   Substances: Nicotine, Flavoring   Devices: Mod  Substance and Sexual Activity   Alcohol use: Not Currently    Comment: 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, active in [place]   Drug use: Not Currently    Comment: previously used, no IV drugs, 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   Sexual activity: Not  Currently    Partners: Male    Comment: patient given condoms  Other Topics Concern   Not on file  Social History Narrative   Not on file   Social Drivers of Health   Financial Resource Strain: High Risk (10/05/2023)   Overall Financial Resource Strain (CARDIA)    Difficulty of Paying Living Expenses: Very hard  Food Insecurity: Food Insecurity Present (10/05/2023)   Hunger Vital Sign    Worried About Running Out of Food in the Last Year: Often true    Ran Out of Food in the Last Year: Often true  Transportation Needs: Unmet Transportation Needs (10/05/2023)   PRAPARE - Administrator, Civil Service (Medical): No    Lack of Transportation (Non-Medical): Yes   Physical Activity: Sufficiently Active (10/05/2023)   Exercise Vital Sign    Days of Exercise per Week: 7 days    Minutes of Exercise per Session: 90 min  Stress: Stress Concern Present (10/05/2023)   [employer] of Occupational Health - Occupational Stress Questionnaire    Feeling of Stress : Very much  Social Connections: Socially Isolated (04/18/2023)   Social Connection and Isolation Panel [NHANES]    Frequency of Communication with Friends and Family: Once a week    Frequency of Social Gatherings with Friends and Family: Never    Attends Religious Services: Never    Database administrator or Organizations: No    Attends Engineer, structural: Not on file    Marital Status: Divorced    Allergies  Allergen Reactions   Niacin Anaphylaxis   Orange Oil Anaphylaxis   Zolpidem     Other reaction(s): Unknown Sleep walking and sleep eating     Current Outpatient Medications:    albuterol (VENTOLIN HFA) 108 (90 Base) MCG/ACT inhaler, Inhale 2 puffs into the lungs every 6 (six) hours as needed for wheezing or shortness of breath., Disp: 8 g, Rfl: 2   [START ON 11/16/2023] ARIPiprazole (ABILIFY) 15 MG tablet, Take 1 tablet (15 mg total) by mouth daily., Disp: 90 tablet, Rfl: 0   atorvastatin (LIPITOR) 20 MG tablet, Take 1 tablet (20 mg total) by mouth daily., Disp: 30 tablet, Rfl: 11   beclomethasone (QVAR) 80 MCG/ACT inhaler, Inhale 1 puff into the lungs 2 (two) times daily., Disp: 1 each, Rfl: 2   bictegravir-emtricitabine-tenofovir AF (BIKTARVY) 50-200-25 MG TABS tablet, Take 1 tablet by mouth daily., Disp: 30 tablet, Rfl: 11   Budeson-Glycopyrrol-Formoterol (BREZTRI AEROSPHERE) 160-9-4.8 MCG/ACT AERO, Inhale 2 puffs into the lungs 2 (two) times daily. (Patient not taking: Reported on 06/19/2023), Disp: 10.7 g, Rfl: 11   cetirizine (ZYRTEC) 10 MG tablet, Take 1 tablet (10 mg total) by mouth daily., Disp: 90 tablet, Rfl: 3   eszopiclone 3 MG TABS, Take 1 tablet (3 mg total)  by mouth at bedtime. Take immediately before bedtime, Disp: 30 tablet, Rfl: 1   fluticasone (FLONASE) 50 MCG/ACT nasal spray, Place 2 sprays into both nostrils daily., Disp: 16 g, Rfl: 6   metoCLOPramide (REGLAN) 5 MG tablet, Take 1 tablet (5 mg total) by mouth 4 (four) times daily -  before meals and at bedtime., Disp: 120 tablet, Rfl: 0   montelukast (SINGULAIR) 10 MG tablet, TAKE 1 TABLET BY MOUTH EVERY DAY, Disp: 90 tablet, Rfl: 3   mupirocin ointment (BACTROBAN) 2 %, Apply 1 Application topically 2 (two) times daily. For 10 days, Disp: 22 g, Rfl: 0   naproxen (NAPROSYN) 375 MG tablet, Take 375 mg by  mouth 2 (two) times daily., Disp: , Rfl:    omeprazole (PRILOSEC) 40 MG capsule, Take 1 capsule (40 mg total) by mouth in the morning and at bedtime., Disp: 180 capsule, Rfl: 0   ondansetron (ZOFRAN-ODT) 4 MG disintegrating tablet, Take 1 tablet (4 mg total) by mouth every 8 (eight) hours as needed., Disp: 30 tablet, Rfl: 0   potassium citrate (UROCIT-K) 10 MEQ (1080 MG) SR tablet, Take 1 tablet (10 mEq total) by mouth 3 (three) times daily with meals., Disp: 90 tablet, Rfl: 11   [START ON 11/16/2023] prazosin (MINIPRESS) 1 MG capsule, Take 1 capsule (1 mg total) by mouth at bedtime., Disp: 90 capsule, Rfl: 0   [START ON 11/16/2023] sertraline (ZOLOFT) 100 MG tablet, Take 1.5 tablets (150 mg total) by mouth at bedtime., Disp: 135 tablet, Rfl: 0   SUMAtriptan (IMITREX) 100 MG tablet, TAKE ONE TABLET BY MOUTH AT ONSET OF THE HEADACHE. MAY REPEAT IN TWO HOURS, Disp: 10 tablet, Rfl: 3   tamsulosin (FLOMAX) 0.4 MG CAPS capsule, Take 0.4 mg by mouth daily., Disp: , Rfl:    tiZANidine (ZANAFLEX) 4 MG tablet, Take 1 tablet (4 mg total) by mouth 2 (two) times daily., Disp: 180 tablet, Rfl: 0   topiramate (TOPAMAX) 100 MG tablet, TAKE 1 TABLET BY MOUTH TWICE A DAY, Disp: 180 tablet, Rfl: 3   Review of Systems     Objective:   Physical Exam        Assessment & Plan:

## 2023-11-15 ENCOUNTER — Ambulatory Visit: Payer: Self-pay | Admitting: *Deleted

## 2023-11-15 ENCOUNTER — Telehealth: Payer: Self-pay

## 2023-11-15 ENCOUNTER — Ambulatory Visit: Payer: Medicaid Other | Admitting: Infectious Disease

## 2023-11-15 DIAGNOSIS — F319 Bipolar disorder, unspecified: Secondary | ICD-10-CM

## 2023-11-15 DIAGNOSIS — Z7185 Encounter for immunization safety counseling: Secondary | ICD-10-CM

## 2023-11-15 DIAGNOSIS — B20 Human immunodeficiency virus [HIV] disease: Secondary | ICD-10-CM

## 2023-11-15 DIAGNOSIS — Z9884 Bariatric surgery status: Secondary | ICD-10-CM

## 2023-11-15 DIAGNOSIS — I6930 Unspecified sequelae of cerebral infarction: Secondary | ICD-10-CM

## 2023-11-15 DIAGNOSIS — E785 Hyperlipidemia, unspecified: Secondary | ICD-10-CM

## 2023-11-15 NOTE — Telephone Encounter (Signed)
Reason for Disposition . [1] MODERATE weakness (i.e., interferes with work, school, normal activities) AND [2] persists > 3 days  Answer Assessment - Initial Assessment Questions 1. DESCRIPTION: "Describe how you are feeling."     Prolonged sleep- patient states he will sleep for 48 hours- too weak/sleepy to get up. 2. SEVERITY: "How bad is it?"  "Can you stand and walk?"   - MILD (0-3): Feels weak or tired, but does not interfere with work, school or normal activities.   - MODERATE (4-7): Able to stand and walk; weakness interferes with work, school, or normal activities.   - SEVERE (8-10): Unable to stand or walk; unable to do usual activities.    Severe-moderate 3. ONSET: "When did these symptoms begin?" (e.g., hours, days, weeks, months)     Stared 1 month ago- once/week patient will have prolong sleep period 4. CAUSE: "What do you think is causing the weakness or fatigue?" (e.g., not drinking enough fluids, medical problem, trouble sleeping)     Unsure- recent diagnosis gastroparesis, weight loss-134lb 5. NEW MEDICINES:  "Have you started on any new medicines recently?" (e.g., opioid pain medicines, benzodiazepines, muscle relaxants, antidepressants, antihistamines, neuroleptics, beta blockers)     No new medications  6. OTHER SYMPTOMS: "Do you have any other symptoms?" (e.g., chest pain, fever, cough, SOB, vomiting, diarrhea, bleeding, other areas of pain)     Fever this week 3 days before this last sleep episode, BM- once weekly, vomiting- 2-3 time/week  Protocols used: Weakness (Generalized) and Fatigue-A-AH

## 2023-11-15 NOTE — Telephone Encounter (Signed)
Called patient regarding appt today. States that medicaid transportation canceled his ride and will need to reschedule. Appt moved to 2/18. During call pt states he has missed about 3-4 doses of medication. States the reason for this is because he has been having trouble staying awake. Sleeps for 2-2.5 days; waking up for a few mins before falling back asleep. Is not sure why this is happening. Will discuss with PCP at appt Juanita Laster, RMA

## 2023-11-15 NOTE — Telephone Encounter (Signed)
  Chief Complaint: fatigue, excessive sleep- 48 hours- wakes and can not get up Symptoms: fatigue, prolonged sleep- with no energy to get up Frequency: started 4 weeks ago- will stay in bed sleeping for 48 hours- no energy to get up Pertinent Negatives: Patient denies new medication, changes in bowel habits Disposition: [] ED /[] Urgent Care (no appt availability in office) / [x] Appointment(In office/virtual)/ []  [place] [facility]/ [] Home Care/ [] Refused Recommended Disposition /[] [place] [facility]/ []  Follow-up with PCP Additional Notes: Patient states he is excessively fatigued- no energy. Patient has noticed for the last 4 weeks- he will sleep for 48 hours- if he wakes- he has no energy to get out of bed. Appointment scheduled

## 2023-11-16 ENCOUNTER — Ambulatory Visit (INDEPENDENT_AMBULATORY_CARE_PROVIDER_SITE_OTHER): Payer: Medicaid Other | Admitting: Professional Counselor

## 2023-11-16 ENCOUNTER — Ambulatory Visit (INDEPENDENT_AMBULATORY_CARE_PROVIDER_SITE_OTHER): Payer: Medicaid Other | Admitting: Family Medicine

## 2023-11-16 ENCOUNTER — Encounter: Payer: Self-pay | Admitting: Family Medicine

## 2023-11-16 ENCOUNTER — Telehealth: Payer: Self-pay | Admitting: Family Medicine

## 2023-11-16 VITALS — BP 128/90 | HR 74 | Resp 16 | Ht 68.0 in | Wt 152.0 lb

## 2023-11-16 DIAGNOSIS — G43709 Chronic migraine without aura, not intractable, without status migrainosus: Secondary | ICD-10-CM

## 2023-11-16 DIAGNOSIS — F3130 Bipolar disorder, current episode depressed, mild or moderate severity, unspecified: Secondary | ICD-10-CM | POA: Diagnosis not present

## 2023-11-16 DIAGNOSIS — B20 Human immunodeficiency virus [HIV] disease: Secondary | ICD-10-CM | POA: Diagnosis not present

## 2023-11-16 DIAGNOSIS — R5382 Chronic fatigue, unspecified: Secondary | ICD-10-CM | POA: Diagnosis not present

## 2023-11-16 DIAGNOSIS — K5090 Crohn's disease, unspecified, without complications: Secondary | ICD-10-CM

## 2023-11-16 MED ORDER — AIMOVIG 70 MG/ML ~~LOC~~ SOAJ: 70.0000 mg | SUBCUTANEOUS | 11 refills | Status: DC

## 2023-11-16 MED ORDER — SUMATRIPTAN SUCCINATE 100 MG PO TABS: ORAL_TABLET | ORAL | 3 refills | Status: DC

## 2023-11-16 NOTE — Telephone Encounter (Signed)
Covermymeds is requesting prior authorization Key: [id number] Aimovig 70mg .ml auto injectors

## 2023-11-16 NOTE — Progress Notes (Signed)
   THERAPIST PROGRESS NOTE  Session Time: 8:00 AM - 8:43 AM  Participation Level: Active  Behavioral Response: Casual, Alert, Anxious  Type of Therapy: Individual Therapy  Treatment Goals addressed: ACTIVE BH CCP BIPOLAR DISORDER-MANIA/HYPOMANIA    LTG: "I want to raise the value of myself because I don't think I value enough. I want to raise my standards in men, but that comes back to my value because that's why I date bad boys."    STG: Troy "Jodelle Red" will identify cognitive patterns and beliefs that contribute to symptoms and work towards restructuring those beliefs over the next 12 weeks.    STG: "I want to work on the PTSD." Jodelle Red will reduce the impact of trauma as evidenced by processing the event, identifying stuck points, and restructuring unhealthy thinking patterns around it over the next 12 weeks.     Troy "Jodelle Red" will identify his self-destructive behaviors (such as promiscuity, substance abuse, and aggression) and implement behavior change techniques over the next 12 weeks.   ProgressTowards Goals: Progressing  Interventions: CBT  Summary: Jodelle Red is a 53 y.o. male who presents with bipolar disorder. He appeared alert and oriented x5. Jodelle Red is casually dressed and neatly groomed. He reported he has an appointment with his PCP due to some sleeping concerns. He noted he slept from Monday night until Wednesday. Jodelle Red continues to seek employment but is excited to be looking at an apartment with his roommate. He has court on February 4th for his exes DV charges. Jodelle Red was tearful as he expressed his fear/anxiety about this. He reported it triggers his past trauma. He was receptive to coping mechanisms and reported those will be helpful. 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   Therapist Response: Conducted session with [employer]. Began session with check-in/update since previous session. Used empathetic and reflective listening. Used open-ended questions to facilitate  discussion and summarized Troy [name]'s thoughts/feelings. Explored ways to manage anxiety around court, including breathing exercises, practicing/preparing for testimony/questions, bringing a touchstone, or mints for grounding. Scheduled next appointment and concluded session.   Suicidal/Homicidal: No  Plan: Return again in 2 weeks.  Diagnosis: Bipolar I disorder, most recent episode depressed (HCC)  Collaboration of Care: Medication management/PCP AEB chart review  Patient/Guardian was advised Release of Information must be obtained prior to any record release in order to collaborate their care with an outside provider. Patient/Guardian was advised if they have not already done so to contact the registration department to sign all necessary forms in order for [place] to release information regarding their care.   Consent: Patient/Guardian gives verbal consent for treatment and assignment of benefits for services provided during this visit. Patient/Guardian expressed understanding and agreed to proceed.   Edmonia Lynch, [facility] 11/16/2023

## 2023-11-16 NOTE — Assessment & Plan Note (Signed)
Reports increased migraine frequency, about 15 days per month. Failed topamax as never effective without tizanidine. Not good candidate for nortriptyline as on SSRI. Interested in switching to a new preventive medication. Discussed Aimovig, a CGRP antagonist, as a new preventive option. Explained Aimovig is a once-monthly injection, on the formulary but requires prior authorization. Discussed starting with the lower dose and increasing if needed. Also discussed the need for sumatriptan for acute management. - Prescribe Aimovig, starting with the lower dose (70mg  subq monthly), and process prior authorization. - Refill sumatriptan 100 mg tablets, to be taken at the first sign of a migraine, with a repeat dose in 2 hours if needed, not exceeding two doses per day or three doses per week.

## 2023-11-16 NOTE — Progress Notes (Signed)
Established patient visit   Patient: Troy [name]   DOB: 02-Oct-1971   53 y.o. Male  MRN: [id number] Visit Date: 11/16/2023  Today's healthcare provider: Shirlee Latch, MD   Chief Complaint  Patient presents with   Fatigue    Fatigue is a chronic problem. New symptoms is excessive sleep. Reports that he is sleeping between 38 hours to 40 hours and still wakes up feeling very tired.   Migraine    Requesting new medications   Subjective    Migraine    HPI     Fatigue    Additional comments: Fatigue is a chronic problem. New symptoms is excessive sleep. Reports that he is sleeping between 38 hours to 40 hours and still wakes up feeling very tired.        Migraine    Additional comments: Requesting new medications      Last edited by Marjie Skiff, CMA on 11/16/2023  9:24 AM.       Discussed the use of AI scribe software for clinical note transcription with the patient, who gave verbal consent to proceed.  History of Present Illness   The patient, with a history of HIV and migraines, presents with extreme fatigue, sleeping for almost 40 hours at a time once a week, and feeling tired during the day. This has occurred four times and happens once a week. Between these episodes, the patient reports normal sleep of about four to five hours a night.  The patient also reports changes in bowel habits, going only once every week or week and a half for the past six to seven months. The patient suspects an infection in the gastric system and reports eating more in an attempt to gain weight. The patient denies constipation and reports that bowel movements are soft but infrequent and large volume.  The patient has a history of migraines and is currently off two medications, Topamax and tizanidine, due to affordability issues previously. The patient reports that migraines have returned heavily since stopping these medications.  Topamax was never effective without tizanidine.  The patient also mentions having missed doses of HIV medication but is scheduled to see an infectious disease specialist next month. Last CD4 counts and viral load stable.         Medications: Outpatient Medications Prior to Visit  Medication Sig   albuterol (VENTOLIN HFA) 108 (90 Base) MCG/ACT inhaler Inhale 2 puffs into the lungs every 6 (six) hours as needed for wheezing or shortness of breath.   ARIPiprazole (ABILIFY) 15 MG tablet Take 1 tablet (15 mg total) by mouth daily.   atorvastatin (LIPITOR) 20 MG tablet Take 1 tablet (20 mg total) by mouth daily.   beclomethasone (QVAR) 80 MCG/ACT inhaler Inhale 1 puff into the lungs 2 (two) times daily.   bictegravir-emtricitabine-tenofovir AF (BIKTARVY) 50-200-25 MG TABS tablet Take 1 tablet by mouth daily.   Budeson-Glycopyrrol-Formoterol (BREZTRI AEROSPHERE) 160-9-4.8 MCG/ACT AERO Inhale 2 puffs into the lungs 2 (two) times daily.   cetirizine (ZYRTEC) 10 MG tablet Take 1 tablet (10 mg total) by mouth daily.   fluticasone (FLONASE) 50 MCG/ACT nasal spray Place 2 sprays into both nostrils daily.   montelukast (SINGULAIR) 10 MG tablet TAKE 1 TABLET BY MOUTH EVERY DAY   mupirocin ointment (BACTROBAN) 2 % Apply 1 Application topically 2 (two) times daily. For 10 days   naproxen (NAPROSYN) 375 MG tablet Take 375 mg by mouth 2 (two) times daily.   omeprazole (PRILOSEC) 40 MG capsule  Take 1 capsule (40 mg total) by mouth in the morning and at bedtime.   ondansetron (ZOFRAN-ODT) 4 MG disintegrating tablet Take 1 tablet (4 mg total) by mouth every 8 (eight) hours as needed.   potassium citrate (UROCIT-K) 10 MEQ (1080 MG) SR tablet Take 1 tablet (10 mEq total) by mouth 3 (three) times daily with meals.   sertraline (ZOLOFT) 100 MG tablet Take 1.5 tablets (150 mg total) by mouth at bedtime.   tamsulosin (FLOMAX) 0.4 MG CAPS capsule Take 0.4 mg by mouth daily.   [DISCONTINUED] SUMAtriptan (IMITREX) 100 MG tablet TAKE ONE TABLET BY MOUTH AT ONSET OF THE  HEADACHE. MAY REPEAT IN TWO HOURS   eszopiclone 3 MG TABS Take 1 tablet (3 mg total) by mouth at bedtime. Take immediately before bedtime   metoCLOPramide (REGLAN) 5 MG tablet Take 1 tablet (5 mg total) by mouth 4 (four) times daily -  before meals and at bedtime.   [DISCONTINUED] prazosin (MINIPRESS) 1 MG capsule Take 1 capsule (1 mg total) by mouth at bedtime.   [DISCONTINUED] tiZANidine (ZANAFLEX) 4 MG tablet Take 1 tablet (4 mg total) by mouth 2 (two) times daily. (Patient not taking: Reported on 11/16/2023)   [DISCONTINUED] topiramate (TOPAMAX) 100 MG tablet TAKE 1 TABLET BY MOUTH TWICE A DAY (Patient not taking: Reported on 11/16/2023)   No facility-administered medications prior to visit.    Review of Systems     Objective    BP (!) 128/90 (BP Location: Left Arm, Patient Position: Sitting, Cuff Size: Normal)   Pulse 74   Resp 16   Ht 5\' 8"  (1.727 m)   Wt 152 lb (68.9 kg)   BMI 23.11 kg/m    Physical Exam Vitals reviewed.  Constitutional:      General: He is not in acute distress.    Appearance: Normal appearance. He is not diaphoretic.  HENT:     Head: Normocephalic and atraumatic.  Eyes:     General: No scleral icterus.    Conjunctiva/sclera: Conjunctivae normal.  Cardiovascular:     Rate and Rhythm: Normal rate and regular rhythm.     Heart sounds: Normal heart sounds. No murmur heard. Pulmonary:     Effort: Pulmonary effort is normal. No respiratory distress.     Breath sounds: Normal breath sounds. No wheezing or rhonchi.  Abdominal:     General: There is no distension.     Palpations: Abdomen is soft.     Tenderness: There is no abdominal tenderness.  Musculoskeletal:     Cervical back: Neck supple.     Right lower leg: No edema.     Left lower leg: No edema.  Lymphadenopathy:     Cervical: No cervical adenopathy.  Skin:    General: Skin is warm and dry.     Findings: No rash.  Neurological:     Mental Status: He is alert and oriented to person, place,  and time. Mental status is at baseline.  Psychiatric:        Mood and Affect: Mood normal.        Behavior: Behavior normal.      No results found for any visits on 11/16/23.  Assessment & Plan     Problem List Items Addressed This Visit       Cardiovascular and Mediastinum   Migraine headache - Primary   Reports increased migraine frequency, about 15 days per month. Failed topamax as never effective without tizanidine. Not good candidate for nortriptyline as on SSRI.  Interested in switching to a new preventive medication. Discussed Aimovig, a CGRP antagonist, as a new preventive option. Explained Aimovig is a once-monthly injection, on the formulary but requires prior authorization. Discussed starting with the lower dose and increasing if needed. Also discussed the need for sumatriptan for acute management. - Prescribe Aimovig, starting with the lower dose (70mg  subq monthly), and process prior authorization. - Refill sumatriptan 100 mg tablets, to be taken at the first sign of a migraine, with a repeat dose in 2 hours if needed, not exceeding two doses per day or three doses per week.      Relevant Medications   Erenumab-aooe (AIMOVIG) 70 MG/ML SOAJ   SUMAtriptan (IMITREX) 100 MG tablet   Other Relevant Orders   CBC with Differential/Platelet   Comprehensive metabolic panel   Vitamin B12   VITAMIN D 25 Hydroxy (Vit-D Deficiency, Fractures)   TSH     Other   Crohn's disease without complication (HCC)   Relevant Orders   CBC with Differential/Platelet   Comprehensive metabolic panel   HIV disease (HCC)   Other Visit Diagnoses       Chronic fatigue       Relevant Orders   CBC with Differential/Platelet   Comprehensive metabolic panel   Vitamin B12   VITAMIN D 25 Hydroxy (Vit-D Deficiency, Fractures)   TSH          Extreme Fatigue Reports episodes of extreme fatigue, sleeping up to 40 hours at a time weekly. Between episodes, sleeps 4-5 hours per night, which they  consider normal. Also reports daytime tiredness. Suspects anemia, thyroid dysfunction, or vitamin deficiencies. Discussed checking kidney and liver function, blood counts, thyroid function, B12, and vitamin D levels. - Order labs to check kidney and liver function, blood counts, thyroid function, B12, and vitamin D levels. - has had previous sleep study  Gastroparesis Reports worsening gastroparesis and change in bowel movements once every week to week and a half for the past 6-7 months. Denies constipation but notes soft and voluminous bowel movements. Suspects gastric infection. Will discuss symptoms with GI specialist tomorrow. - Discuss symptoms with GI specialist tomorrow.  HIV Management Missed recent ID appointment but will see the specialist next month. CD4 count was well-managed in November. Reported missing a few doses of HIV medication. Will ensure follow-up with ID specialist next month. - Ensure follow-up with ID specialist next month.  General Health Maintenance Cholesterol was checked in November. Has not had a physical exam in a couple of years. Will schedule a physical exam in three months. - Schedule a physical exam in three months.  Follow-up - Follow up with GI specialist tomorrow. - Follow up with ID specialist next month. - Return for a physical exam in three months.          Return in about 3 months (around 02/14/2024) for CPE.       Shirlee Latch, MD  [facility] [phone] (phone) [phone] (fax)  [facility]

## 2023-11-17 ENCOUNTER — Encounter: Payer: Self-pay | Admitting: Family Medicine

## 2023-11-17 LAB — COMPREHENSIVE METABOLIC PANEL
ALT: 26 [IU]/L (ref 0–44)
AST: 31 [IU]/L (ref 0–40)
Albumin: 4.3 g/dL (ref 4.1–5.1)
Alkaline Phosphatase: 71 [IU]/L (ref 44–121)
BUN/Creatinine Ratio: 13 (ref 9–20)
BUN: 12 mg/dL (ref 6–24)
Bilirubin Total: 0.5 mg/dL (ref 0.0–1.2)
CO2: 25 mmol/L (ref 20–29)
Calcium: 9.4 mg/dL (ref 8.7–10.2)
Chloride: 101 mmol/L (ref 96–106)
Creatinine, Ser: 0.95 mg/dL (ref 0.76–1.27)
Globulin, Total: 2.2 g/dL (ref 1.5–4.5)
Glucose: 89 mg/dL (ref 70–99)
Potassium: 4.2 mmol/L (ref 3.5–5.2)
Sodium: 140 mmol/L (ref 134–144)
Total Protein: 6.5 g/dL (ref 6.0–8.5)
eGFR: 98 mL/min/{1.73_m2} (ref >59)

## 2023-11-17 LAB — CBC WITH DIFFERENTIAL/PLATELET
Basophils Absolute: 0.1 10*3/uL (ref 0.0–0.2)
Basos: 1 %
EOS (ABSOLUTE): 0 10*3/uL (ref 0.0–0.4)
Eos: 0 %
Hematocrit: 43.5 % (ref 37.5–51.0)
Hemoglobin: 14.8 g/dL (ref 13.0–17.7)
Immature Grans (Abs): 0 10*3/uL (ref 0.0–0.1)
Immature Granulocytes: 0 %
Lymphocytes Absolute: 3.8 10*3/uL — ABNORMAL HIGH (ref 0.7–3.1)
Lymphs: 62 %
MCH: 31.2 pg (ref 26.6–33.0)
MCHC: 34 g/dL (ref 31.5–35.7)
MCV: 92 fL (ref 79–97)
Monocytes Absolute: 0.5 10*3/uL (ref 0.1–0.9)
Monocytes: 7 %
Neutrophils Absolute: 1.8 10*3/uL (ref 1.4–7.0)
Neutrophils: 30 %
Platelets: 122 10*3/uL — ABNORMAL LOW (ref 150–450)
RBC: 4.74 x10E6/uL (ref 4.14–5.80)
RDW: 11.6 % (ref 11.6–15.4)
WBC: 6.1 10*3/uL (ref 3.4–10.8)

## 2023-11-17 LAB — TSH: TSH: 1.47 u[IU]/mL (ref 0.450–4.500)

## 2023-11-17 LAB — VITAMIN B12: Vitamin B-12: 197 pg/mL — ABNORMAL LOW (ref 232–1245)

## 2023-11-17 LAB — VITAMIN D 25 HYDROXY (VIT D DEFICIENCY, FRACTURES): Vit D, 25-Hydroxy: 31.6 ng/mL (ref 30.0–100.0)

## 2023-11-22 ENCOUNTER — Ambulatory Visit: Payer: Medicaid Other

## 2023-11-22 DIAGNOSIS — E538 Deficiency of other specified B group vitamins: Secondary | ICD-10-CM

## 2023-11-22 MED ORDER — CYANOCOBALAMIN 1000 MCG/ML IJ SOLN
1000.0000 ug | Freq: Once | INTRAMUSCULAR | Status: AC
  Administered 2023-11-22: 1000 ug via INTRAMUSCULAR

## 2023-11-22 NOTE — Progress Notes (Signed)
 Patient tolerated B12 injection well  Patient here for B12 injection only.  I did not examine the patient.  I did review his medical history, medications, and allergies.  CMA gave injection. Patient tolerated well.  Mazie Speed, MD, MPH [facility] 04/05/2024 2:35 PM

## 2023-11-23 NOTE — Telephone Encounter (Signed)
PA started and notes faxed to insurance plan

## 2023-11-24 ENCOUNTER — Telehealth (INDEPENDENT_AMBULATORY_CARE_PROVIDER_SITE_OTHER): Payer: Medicaid Other | Admitting: Psychiatry

## 2023-11-24 DIAGNOSIS — Z91199 Patient's noncompliance with other medical treatment and regimen due to unspecified reason: Secondary | ICD-10-CM

## 2023-11-25 DIAGNOSIS — Z419 Encounter for procedure for purposes other than remedying health state, unspecified: Secondary | ICD-10-CM | POA: Diagnosis not present

## 2023-11-27 ENCOUNTER — Other Ambulatory Visit (HOSPITAL_COMMUNITY): Payer: Self-pay

## 2023-11-27 ENCOUNTER — Telehealth: Payer: Self-pay

## 2023-11-27 NOTE — Telephone Encounter (Signed)
Pharmacy Patient Advocate Encounter  Received notification from [facility] that Prior Authorization for Aimovig 70 mg/ml auto injector has been DENIED.  Full denial letter will be uploaded to the media tab. See denial reason below.   PA #/Case ID/Reference #: --

## 2023-11-29 ENCOUNTER — Other Ambulatory Visit: Payer: Self-pay

## 2023-11-29 ENCOUNTER — Other Ambulatory Visit (HOSPITAL_COMMUNITY): Payer: Self-pay

## 2023-11-29 ENCOUNTER — Ambulatory Visit (INDEPENDENT_AMBULATORY_CARE_PROVIDER_SITE_OTHER): Payer: Medicaid Other

## 2023-11-29 ENCOUNTER — Other Ambulatory Visit: Payer: Medicaid Other

## 2023-11-29 DIAGNOSIS — Z7689 Persons encountering health services in other specified circumstances: Secondary | ICD-10-CM | POA: Diagnosis not present

## 2023-11-29 DIAGNOSIS — N401 Enlarged prostate with lower urinary tract symptoms: Secondary | ICD-10-CM

## 2023-11-29 DIAGNOSIS — R3911 Hesitancy of micturition: Secondary | ICD-10-CM | POA: Diagnosis not present

## 2023-11-29 DIAGNOSIS — E538 Deficiency of other specified B group vitamins: Secondary | ICD-10-CM

## 2023-11-29 MED ORDER — CYANOCOBALAMIN 1000 MCG/ML IJ SOLN
1000.0000 ug | Freq: Once | INTRAMUSCULAR | Status: AC
  Administered 2023-11-29: 1000 ug via INTRAMUSCULAR

## 2023-11-29 NOTE — Progress Notes (Signed)
 Patient tolerated B-12 injection well.

## 2023-11-29 NOTE — Telephone Encounter (Signed)
 How do you usually send these B12 Rxs?  I think the CMAs need to know how to order them. The way it is pended is for only one dose. Thanks!

## 2023-11-29 NOTE — Telephone Encounter (Signed)
 Patient came in today for b12 injection. Patient would like to self administered B12 injections at home. Requested for prescription to be send to his pharmacy please. This was his second B12 for the weekly. Patient aware that he has 2 more left on the weekly dosage and then would need to come in for blood work to do the [employer] B12.  I have pended b12 prescription? I don't know if it is correct.

## 2023-11-30 ENCOUNTER — Ambulatory Visit (INDEPENDENT_AMBULATORY_CARE_PROVIDER_SITE_OTHER): Payer: Medicaid Other | Admitting: Professional Counselor

## 2023-11-30 DIAGNOSIS — F3132 Bipolar disorder, current episode depressed, moderate: Secondary | ICD-10-CM

## 2023-11-30 DIAGNOSIS — Z7689 Persons encountering health services in other specified circumstances: Secondary | ICD-10-CM | POA: Diagnosis not present

## 2023-11-30 LAB — PSA: Prostate Specific Ag, Serum: 2.5 ng/mL (ref 0.0–4.0)

## 2023-11-30 NOTE — Telephone Encounter (Signed)
 I like your plan. Have him finish the weekly shots with us  and then order it the usual way for monthly injections at home. Thanks!

## 2023-11-30 NOTE — Progress Notes (Signed)
 THERAPIST PROGRESS NOTE  Session Time: 8:02 AM - 8:30 AM   Participation Level: Minimal  Behavioral Response: Casual, Lethargic, Dysphoric  Type of Therapy: Individual Therapy  Treatment Goals addressed: Active BH Bipolar disorder LTG: I want to raise the value of myself because I don't think I value enough. I want to raise my standards in men, but that comes back to my value because that's why I date bad boys.    Dates: Start:  10/19/23    Expected End:  10/17/24     STG: Neomia Saddler will identify cognitive patterns and beliefs that contribute to symptoms and work towards restructuring those beliefs over the next 12 weeks.    STG: I want to work on the PTSD. Saddler will reduce the impact of trauma as evidenced by processing the event, identifying stuck points, and restructuring unhealthy thinking patterns around it over the next 12 weeks.     Intervention: Alyan Hartline will identify his self-destructive behaviors (such as promiscuity, substance abuse, and aggression) and implement behavior change techniques over the next 12 weeks.   ProgressTowards Goals: Progressing  Interventions: Motivational Interviewing and Solution Focused  Summary: Saddler is a 53 y.o. male who presents with bipolar disorder. He appeared lethargic but oriented x5 today. He reported he has continued to struggle with hypersomnia. He was recently put on B12 shots and had his second one yesterday. He hopes this will eventually help him regain energy. [name] noted his roommate has threatened to kick him out if he doesn't get a job. He had an interview at [employer] [facility] [employer] and is supposed to hear back by tomorrow. He may look into temp agencies and took note of those listed by this clinical research associate. Saddler reported court went better than he expected. He used skills discussed in last session and they were helpful. His ex was convicted of DV assault and communicating threats. He was pleased with the outcome. [name] reported he hasn't been  on the dating/sex apps since he's been tired. [name] scored moderately severe on depression and mild on anxiety screening today.   Therapist Response: Conducted session with [employer]. Began session with check-in/update since previous session. Utilized empathetic and reflective listening. Brainstormed options for temporary work if [employer] does not get job at [employer]. Praised Industrial/product Designer for using skills to manage his anxiety during court. Inquired about impulsive/risky behaviors and noted how hypersomnia has prevented him from these behaviors. Administered PHQ9 and GAD7 screenings. Scheduled next appointment and concluded session.   Suicidal/Homicidal: No  Plan: Return again in 3 weeks.  Diagnosis: Bipolar affective disorder, currently depressed, moderate (HCC)  Collaboration of Care: Medication Management AEB chart review  Patient/Guardian was advised Release of Information must be obtained prior to any record release in order to collaborate their care with an outside provider. Patient/Guardian was advised if they have not already done so to contact the registration department to sign all necessary forms in order for us  to release information regarding their care.   Consent: Patient/Guardian gives verbal consent for treatment and assignment of benefits for services provided during this visit. Patient/Guardian expressed understanding and agreed to proceed.   Almarie JONETTA Ligas, [facility] 11/30/2023

## 2023-12-04 ENCOUNTER — Other Ambulatory Visit: Payer: Self-pay

## 2023-12-04 ENCOUNTER — Other Ambulatory Visit: Payer: Self-pay | Admitting: Psychiatry

## 2023-12-04 ENCOUNTER — Other Ambulatory Visit (HOSPITAL_COMMUNITY): Payer: Self-pay

## 2023-12-04 ENCOUNTER — Telehealth: Payer: Self-pay

## 2023-12-04 ENCOUNTER — Ambulatory Visit: Payer: Self-pay

## 2023-12-04 MED ORDER — ESZOPICLONE 3 MG PO TABS
3.0000 mg | ORAL_TABLET | Freq: Every day | ORAL | 0 refills | Status: DC
  Filled 2023-12-04: qty 30, 30d supply, fill #0

## 2023-12-04 NOTE — Telephone Encounter (Signed)
 Received a fax from the patients pharmacy stating that the following medication has no refills and or has expired please advise  eszopiclone  3 MG TABS   Last visit 11-24-23 Next visit 12-13-23  Preferred pharmacy  [id number] [place], [place] - [address] Phone: [phone]  Fax: [phone]

## 2023-12-04 NOTE — Telephone Encounter (Signed)
 Ordered

## 2023-12-04 NOTE — Progress Notes (Signed)
 Specialty Pharmacy Refill Coordination Note  Troy [name] is a 53 y.o. male contacted today regarding refills of specialty medication(s) Bictegravir-Emtricitab-Tenofov (Biktarvy )   Patient requested Delivery   Delivery date: 12/12/23   Verified address: [address]   [place] [address]   Medication will be filled on 12/11/23.

## 2023-12-04 NOTE — Telephone Encounter (Signed)
  Chief Complaint: Needs medication for migraines Symptoms: Daily migraines Frequency: daily Pertinent Negatives: Patient denies  Disposition: [] ED /[] Urgent Care (no appt availability in office) / [] Appointment(In office/virtual)/ []  [place] [facility]/ [] Home Care/ [] Refused Recommended Disposition /[] [place] [facility]/ [x]  Follow-up with PCP Additional Notes: Pt has read and understands that the migraine medication has been denied. Pt is experiencing daily Has and needs something to help with them.  Pt would like something called in to try. Please advise.    Summary: Migraine   Patient states that he is supposed to get an injection for migraines, but has not heard from the office. Patient states that he is having a migraine every day now. Please advise.     Reason for Disposition  [1] Caller has URGENT medicine question about med that PCP or specialist prescribed AND [2] triager unable to answer question  Answer Assessment - Initial Assessment Questions 1. NAME of MEDICINE: "What medicine(s) are you calling about?"     Migraine medication  4. SYMPTOMS: "Do you have any symptoms?" If Yes, ask: "What symptoms are you having?"  "How bad are the symptoms (e.g., mild, moderate, severe)     Daily migraines  Protocols used: Medication Question Call-A-AH

## 2023-12-05 ENCOUNTER — Other Ambulatory Visit (HOSPITAL_COMMUNITY): Payer: Self-pay

## 2023-12-05 NOTE — Telephone Encounter (Signed)
See mychart discussion with patient

## 2023-12-06 ENCOUNTER — Telehealth: Payer: Self-pay

## 2023-12-06 ENCOUNTER — Telehealth: Payer: Self-pay | Admitting: Family Medicine

## 2023-12-06 ENCOUNTER — Other Ambulatory Visit: Payer: Self-pay | Admitting: Psychiatry

## 2023-12-06 ENCOUNTER — Ambulatory Visit: Payer: Medicaid Other

## 2023-12-06 ENCOUNTER — Other Ambulatory Visit (HOSPITAL_COMMUNITY): Payer: Self-pay

## 2023-12-06 MED ORDER — ESZOPICLONE 3 MG PO TABS: 3.0000 mg | ORAL_TABLET | Freq: Every evening | ORAL | 0 refills | Status: DC | PRN

## 2023-12-06 NOTE — Telephone Encounter (Signed)
pt states that he is out of the eszopiclone 3mg . pt was told that rx was sent [facility]. he said to cancel because he needs it today.   [facility] [address] [place], [place]

## 2023-12-06 NOTE — Telephone Encounter (Signed)
[employer] is requesting prior authorization Key: [id number] Aimovig 70mg /ml auto injectors

## 2023-12-06 NOTE — Telephone Encounter (Signed)
Ordered

## 2023-12-06 NOTE — Telephone Encounter (Signed)
left message that rx was sent to the local pharmacy because the mailed one would take 2-3 days

## 2023-12-06 NOTE — Telephone Encounter (Signed)
Since he is on weekly shots, please double book him on Dr Senaida Lange schedule for Friday. Thanks    Copied from CRM [id number]. Topic: Appointment Scheduling - Scheduling Inquiry for Clinic >> Dec 06, 2023 12:06 PM Troy [name] wrote: Reason for CRM: Pt called reporting that he is unable to come today for his appt, pt wants to know if it is okay to come next week or if he can be seen Friday.  Best contact: [phone]

## 2023-12-06 NOTE — Telephone Encounter (Signed)
called and canceled the rx because pt is out of medications and needs medication today.

## 2023-12-07 ENCOUNTER — Ambulatory Visit: Payer: Medicaid Other | Admitting: Physician Assistant

## 2023-12-07 VITALS — BP 120/89 | HR 89 | Ht 68.0 in | Wt 153.2 lb

## 2023-12-07 DIAGNOSIS — Z8042 Family history of malignant neoplasm of prostate: Secondary | ICD-10-CM

## 2023-12-07 DIAGNOSIS — Q5522 Retractile testis: Secondary | ICD-10-CM

## 2023-12-07 DIAGNOSIS — R300 Dysuria: Secondary | ICD-10-CM | POA: Diagnosis not present

## 2023-12-07 DIAGNOSIS — R3911 Hesitancy of micturition: Secondary | ICD-10-CM

## 2023-12-07 DIAGNOSIS — N401 Enlarged prostate with lower urinary tract symptoms: Secondary | ICD-10-CM | POA: Diagnosis not present

## 2023-12-07 DIAGNOSIS — N4289 Other specified disorders of prostate: Secondary | ICD-10-CM

## 2023-12-07 DIAGNOSIS — N200 Calculus of kidney: Secondary | ICD-10-CM

## 2023-12-07 DIAGNOSIS — N411 Chronic prostatitis: Secondary | ICD-10-CM

## 2023-12-07 DIAGNOSIS — Z7689 Persons encountering health services in other specified circumstances: Secondary | ICD-10-CM | POA: Diagnosis not present

## 2023-12-07 DIAGNOSIS — N529 Male erectile dysfunction, unspecified: Secondary | ICD-10-CM

## 2023-12-07 LAB — BLADDER SCAN AMB NON-IMAGING: Scan Result: 1

## 2023-12-07 MED ORDER — TADALAFIL 5 MG PO TABS
5.0000 mg | ORAL_TABLET | Freq: Every day | ORAL | 11 refills | Status: AC
  Filled 2024-07-18 – 2024-07-27 (×2): qty 30, 30d supply, fill #0
  Filled 2024-09-24: qty 30, 30d supply, fill #1
  Filled 2024-10-25: qty 30, 30d supply, fill #2
  Filled 2024-11-26: qty 30, 30d supply, fill #3

## 2023-12-07 MED ORDER — TAMSULOSIN HCL 0.4 MG PO CAPS: 0.8000 mg | ORAL_CAPSULE | Freq: Every day | ORAL | 0 refills | Status: DC

## 2023-12-07 NOTE — Progress Notes (Signed)
12/07/2023 9:22 AM   [name] [birth year] [id number]  CC: Chief Complaint  Patient presents with   Benign Prostatic Hypertrophy   HPI: Marinus Eicher is a 53 y.o. male with PMH recurrent nephrolithiasis on potassium citrate, diabetes with gastroparesis, CVA, and BPH on Flomax who presents today for follow-up.   Today we discussed the following:  1. Chronic prostatitis He reports a 3 to 29-month history of increased prostate tenderness with 7 to 8 months of worsening urinary straining and dysuria.  He already takes ibuprofen 800 mg twice daily.  2. Family history of prostate cancer He reports a strong family history of terminal prostate cancer on his mother side.  His mother had breast and likely cervical cancer but survived these.  He states his maternal grandfather and 5-6 maternal uncles all had prostate cancers that were terminal starting around their 23s.  He has never seen genetics for this.  PSA history as follows: 11/29/2023: 2.5 10/05/2020: 1.0 04/20/2017: 1.1 06/23/2015: 0.6  3. Erectile dysfunction, unspecified erectile dysfunction type New erectile dysfunction x 4 to 5 months.  He describes difficulty achieving erection, insufficiently firm erections, and premature detumescence.  Ejaculation and orgasm are unaffected.  4. Benign prostatic hyperplasia with urinary hesitancy He remains on Flomax 0.4 mg daily and is unsure if it is helping.  He is tolerating it well without orthostasis.  See worsening voiding symptoms under #1 above. IPSS 18/mostly dissatisfied as below.  PVR 1 mL.  5. Nephrolithiasis No stone episodes since his last office visit.  He remains on potassium citrate and is tolerating it well.  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  He is able to push it back down into the scrotum.  He thinks this is secondary to testicular atrophy and wonders about his testosterone levels.   IPSS     Row Name 12/07/23  0900         International Prostate Symptom Score   How often have you had the sensation of not emptying your bladder? About half the time     How often have you had to urinate less than every two hours? Not at All     How often have you found you stopped and started again several times when you urinated? More than half the time     How often have you found it difficult to postpone urination? Not at All     How often have you had a weak urinary stream? Almost always     How often have you had to strain to start urination? Almost always     How many times did you typically get up at night to urinate? 1 Time     Total IPSS Score 18       Quality of Life due to urinary symptoms   If you were to spend the rest of your life with your urinary condition just the way it is now how would you feel about that? Mostly Disatisfied               PMH: Past Medical History:  Diagnosis Date   Asthma    Bipolar 1 disorder (HCC)    Diabetes mellitus without complication (HCC)    controlled by weight loss   GERD (gastroesophageal reflux disease)    History of CVA with residual deficit    HIV infection (HCC)    Hyperlipidemia 08/22/2022   Migraine headache    2-3 X/month  Nephrolithiasis    Wears dentures    full upper and lower    Surgical History: Past Surgical History:  Procedure Laterality Date   COLONOSCOPY     COLONOSCOPY WITH PROPOFOL N/A 05/30/2019   Procedure: COLONOSCOPY WITH PROPOFOL;  Surgeon: Toney Reil, MD;  Location: [facility] ENDOSCOPY;  Service: Gastroenterology;  Laterality: N/A;   COLONOSCOPY WITH PROPOFOL N/A 03/22/2023   Procedure: COLONOSCOPY WITH PROPOFOL;  Surgeon: Toney Reil, MD;  Location: [facility] ENDOSCOPY;  Service: Gastroenterology;  Laterality: N/A;   ELBOW SURGERY Left    fracture   ESOPHAGOGASTRODUODENOSCOPY (EGD) WITH PROPOFOL N/A 05/30/2019   Procedure: ESOPHAGOGASTRODUODENOSCOPY (EGD) WITH PROPOFOL;  Surgeon: Toney Reil, MD;  Location:  [facility] ENDOSCOPY;  Service: Gastroenterology;  Laterality: N/A;   ESOPHAGOGASTRODUODENOSCOPY (EGD) WITH PROPOFOL N/A 09/12/2019   Procedure: ESOPHAGOGASTRODUODENOSCOPY (EGD) WITH PROPOFOL;  Surgeon: Toney Reil, MD;  Location: [facility] ENDOSCOPY;  Service: Gastroenterology;  Laterality: N/A;   ESOPHAGOGASTRODUODENOSCOPY (EGD) WITH PROPOFOL N/A 11/23/2022   Procedure: ESOPHAGOGASTRODUODENOSCOPY (EGD) WITH PROPOFOL;  Surgeon: Toney Reil, MD;  Location: [facility] ENDOSCOPY;  Service: Gastroenterology;  Laterality: N/A;   FACIAL COSMETIC SURGERY     GASTRIC BYPASS     HERNIA REPAIR     KIDNEY STONE SURGERY     renal artery repair      Home Medications:  Allergies as of 12/07/2023       Reactions   Niacin Anaphylaxis   Orange Oil Anaphylaxis   Zolpidem    Other reaction(s): Unknown Sleep walking and sleep eating        Medication List        Accurate as of December 07, 2023  9:22 AM. If you have any questions, ask your nurse or doctor.          STOP taking these medications    Aimovig 70 MG/ML Soaj Generic drug: Erenumab-aooe       TAKE these medications    albuterol 108 (90 Base) MCG/ACT inhaler Commonly known as: VENTOLIN HFA Inhale 2 puffs into the lungs every 6 (six) hours as needed for wheezing or shortness of breath.   ARIPiprazole 15 MG tablet Commonly known as: ABILIFY Take 1 tablet (15 mg total) by mouth daily.   atorvastatin 20 MG tablet Commonly known as: Lipitor Take 1 tablet (20 mg total) by mouth daily.   beclomethasone 80 MCG/ACT inhaler Commonly known as: QVAR Inhale 1 puff into the lungs 2 (two) times daily.   Biktarvy 50-200-25 MG Tabs tablet Generic drug: bictegravir-emtricitabine-tenofovir AF Take 1 tablet by mouth daily.   Breztri Aerosphere 160-9-4.8 MCG/ACT Aero Generic drug: Budeson-Glycopyrrol-Formoterol Inhale 2 puffs into the lungs 2 (two) times daily.   cetirizine 10 MG tablet Commonly known as: ZYRTEC Take 1 tablet (10  mg total) by mouth daily.   Eszopiclone 3 MG Tabs Commonly known as: eszopiclone Take 1 tablet (3 mg total) by mouth at bedtime as needed. Take immediately before bedtime   fluticasone 50 MCG/ACT nasal spray Commonly known as: FLONASE Place 2 sprays into both nostrils daily.   metoCLOPramide 5 MG tablet Commonly known as: Reglan Take 1 tablet (5 mg total) by mouth 4 (four) times daily -  before meals and at bedtime.   montelukast 10 MG tablet Commonly known as: SINGULAIR TAKE 1 TABLET BY MOUTH EVERY DAY   mupirocin ointment 2 % Commonly known as: BACTROBAN Apply 1 Application topically 2 (two) times daily. For 10 days   naproxen 375 MG tablet Commonly known as: NAPROSYN Take  375 mg by mouth 2 (two) times daily.   omeprazole 40 MG capsule Commonly known as: PRILOSEC Take 1 capsule (40 mg total) by mouth in the morning and at bedtime.   ondansetron 4 MG disintegrating tablet Commonly known as: ZOFRAN-ODT Take 1 tablet (4 mg total) by mouth every 8 (eight) hours as needed.   potassium citrate 10 MEQ (1080 MG) SR tablet Commonly known as: UROCIT-K Take 1 tablet (10 mEq total) by mouth 3 (three) times daily with meals.   sertraline 100 MG tablet Commonly known as: ZOLOFT Take 1.5 tablets (150 mg total) by mouth at bedtime.   SUMAtriptan 100 MG tablet Commonly known as: IMITREX TAKE ONE TABLET BY MOUTH AT ONSET OF THE HEADACHE. MAY REPEAT IN TWO HOURS   tamsulosin 0.4 MG Caps capsule Commonly known as: FLOMAX Take 0.4 mg by mouth daily.        Allergies:  Allergies  Allergen Reactions   Niacin Anaphylaxis   Orange Oil Anaphylaxis   Zolpidem     Other reaction(s): Unknown Sleep walking and sleep eating    Family History: Family History  Problem Relation Age of Onset   Bipolar disorder Mother    COPD Mother    Diabetes Mother    Hypertension Mother    Hyperlipidemia Mother    Healthy Father    Cancer Father        skin   Stroke Brother    Heart  disease Brother    Seizures Brother    Thyroid disease Brother    Diabetes Maternal Grandfather    Hyperlipidemia Maternal Grandfather    Cancer Maternal Grandmother        breast   Hyperlipidemia Maternal Grandmother    Hypertension Maternal Grandmother    Heart disease Paternal Grandfather    Hypertension Paternal Grandfather     Social History:   reports that he has been smoking e-cigarettes. He has never used smokeless tobacco. He reports that he does not currently use alcohol. He reports that he does not currently use drugs.  Physical Exam: BP 120/89   Pulse 89   Ht 5\' 8"  (1.727 m)   Wt 153 lb 4 oz (69.5 kg)   BMI 23.30 kg/m   Constitutional:  Alert and oriented, no acute distress, nontoxic appearing HEENT: [place], AT Cardiovascular: No clubbing, cyanosis, or edema Respiratory: Normal respiratory effort, no increased work of breathing GU: Bilateral descended testicles, rather atrophic, without nodules or masses. DRE: Normal sphincter tone, smooth, symmetrically enlarged 50+ cc prostate with boggy texture and tenderness to palpation Skin: No rashes, bruises or suspicious lesions Neurologic: Grossly intact, no focal deficits, moving all 4 extremities Psychiatric: Normal mood and affect  Laboratory Data: Results for orders placed or performed in visit on 12/07/23  Bladder Scan (Post Void Residual) in office   Collection Time: 12/07/23  9:06 AM  Result Value Ref Range   Scan Result 1 ml    Assessment & Plan:   1. Chronic prostatitis (Primary) Several months of increased prostate tenderness and worsening LUTS with dysuria as well as boggy prostate on exam today consistent with chronic prostatitis.  I am not prescribing anti-inflammatories today since he is already on ibuprofen chronically.  Will have him increase Flomax to twice daily and come back to leave me a urine sample next week.  Will consider 4 to 6 weeks of antibiotics if his symptoms do not improve. - Bladder Scan  (Post Void Residual) in office - tamsulosin (FLOMAX) 0.4 MG CAPS capsule;  Take 2 capsules (0.8 mg total) by mouth daily.  Dispense: 60 capsule; Refill: 0 - Urinalysis, Complete; Future  2. Family history of prostate cancer Strong family history of aggressive prostate cancer on his mother side as well as breast and possible cervical cancer in his mother per patient report.  I recommended genetic counseling for this and he agreed.  PSA is WNL, however slight increase over last value, which is 51 years old.  I suspect there may be some false elevation in the setting of #1 above.  We will plan to monitor this closely given this history. - Ambulatory referral to Genetics  3. Erectile dysfunction, unspecified erectile dysfunction type Will start daily low-dose tadalafil to co-treat ED and BPH as below.  May add on demand dose tadalafil in the future if needed. - tadalafil (CIALIS) 5 MG tablet; Take 1 tablet (5 mg total) by mouth daily.  Dispense: 30 tablet; Refill: 11  4. Benign prostatic hyperplasia with urinary hesitancy Continue Flomax with temporary increase dose as above.  Adding on low-dose daily tadalafil as well.  If he continues to have bothersome obstructive symptoms, would recommend cystoscopy for further evaluation. - tadalafil (CIALIS) 5 MG tablet; Take 1 tablet (5 mg total) by mouth daily.  Dispense: 30 tablet; Refill: 11  5. Nephrolithiasis No symptoms currently.  Continue potassium citrate.  Serum potassium has been WNL.  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  No masses on physical exam today.  Will continue to monitor.  Return in about 1 week (around 12/14/2023) for Lab visit for UA.  Carman Ching, PA-C  [facility] [place] [address], [address] [place], [place] [address] [phone]
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
## 2023-12-07 NOTE — Patient Instructions (Signed)
Continue potassium citrate Continue Flomax (tamsulosin) but INCREASE your dose to twice daily for the next 4 weeks. Come back and drop off a urine sample with me next week. If your burning and prostate irritation are no better within 2 weeks, send me a MyChart message to let me know and I'll start you on antibiotics. Start Cialis (tadalafil) 5mg  daily to help with your enlarged prostate and erectile dysfunction The genetic counseling office will call you to schedule an appointment.

## 2023-12-08 ENCOUNTER — Ambulatory Visit: Payer: Medicaid Other | Admitting: Family Medicine

## 2023-12-09 ENCOUNTER — Other Ambulatory Visit: Payer: Self-pay | Admitting: Family Medicine

## 2023-12-09 DIAGNOSIS — M25561 Pain in right knee: Secondary | ICD-10-CM

## 2023-12-11 ENCOUNTER — Encounter: Payer: Self-pay | Admitting: Infectious Disease

## 2023-12-11 DIAGNOSIS — N400 Enlarged prostate without lower urinary tract symptoms: Secondary | ICD-10-CM | POA: Insufficient documentation

## 2023-12-11 DIAGNOSIS — F1090 Alcohol use, unspecified, uncomplicated: Secondary | ICD-10-CM

## 2023-12-11 DIAGNOSIS — F4310 Post-traumatic stress disorder, unspecified: Secondary | ICD-10-CM | POA: Insufficient documentation

## 2023-12-11 HISTORY — DX: Post-traumatic stress disorder, unspecified: F43.10

## 2023-12-11 HISTORY — DX: Alcohol use, unspecified, uncomplicated: F10.90

## 2023-12-11 HISTORY — DX: Benign prostatic hyperplasia without lower urinary tract symptoms: N40.0

## 2023-12-11 NOTE — Telephone Encounter (Signed)
Requested medication (s) are due for refill today: Yes  Requested medication (s) are on the active medication list: Yes  Last refill:  09/14/23  Future visit scheduled: Yes  Notes to clinic:  Historical provider.    Requested Prescriptions  Pending Prescriptions Disp Refills   naproxen (NAPROSYN) 375 MG tablet [Pharmacy Med Name: NAPROXEN 375 MG TABLET] 60 tablet     Sig: TAKE 1 TABLET (375 MG TOTAL) BY MOUTH 2 (TWO) TIMES DAILY WITH A MEAL.     Analgesics:  NSAIDS Failed - 12/11/2023 11:44 AM      Failed - Manual Review: Labs are only required if the patient has taken medication for more than 8 weeks.      Failed - PLT in normal range and within 360 days    Platelets  Date Value Ref Range Status  11/16/2023 122 (L) 150 - 450 x10E3/uL Final         Passed - Cr in normal range and within 360 days    Creat  Date Value Ref Range Status  09/14/2023 1.23 0.70 - 1.30 mg/dL Final   Creatinine, Ser  Date Value Ref Range Status  11/16/2023 0.95 0.76 - 1.27 mg/dL Final         Passed - HGB in normal range and within 360 days    Hemoglobin  Date Value Ref Range Status  11/16/2023 14.8 13.0 - 17.7 g/dL Final         Passed - HCT in normal range and within 360 days    Hematocrit  Date Value Ref Range Status  11/16/2023 43.5 37.5 - 51.0 % Final         Passed - eGFR is 30 or above and within 360 days    GFR calc Af Amer  Date Value Ref Range Status  01/22/2020 >60 >60 mL/min Final   GFR, Estimated  Date Value Ref Range Status  02/13/2023 >60 >60 mL/min Final    Comment:    (NOTE) Calculated using the CKD-EPI Creatinine Equation (2021)    eGFR  Date Value Ref Range Status  11/16/2023 98 >59 mL/min/1.73 Final         Passed - Patient is not pregnant      Passed - Valid encounter within last 12 months    Recent Outpatient Visits           2 weeks ago B12 deficiency   [place] [facility], Joseline E, CMA   3 weeks ago Chronic migraine  without aura without status migrainosus, not intractable   [facility], Marzella Schlein, MD   3 months ago Acute pain of right knee   [place] [facility], Marzella Schlein, MD   5 months ago Chronic pain of right knee   [facility], Monico Blitz, DO   7 months ago Urinary retention   [facility], Oswaldo Conroy, [place]       Future Appointments             Tomorrow Daiva Eves, Lisette Grinder, MD [facility] [place]. [facility]   In 1 month Vanga, Loel Dubonnet, MD [facility]  Gastroenterology at [facility]   In 2 months Bacigalupo, Marzella Schlein, MD [facility], [facility]

## 2023-12-11 NOTE — Telephone Encounter (Signed)
Already messaged patient about this.

## 2023-12-12 ENCOUNTER — Other Ambulatory Visit: Payer: Self-pay | Admitting: Physician Assistant

## 2023-12-12 ENCOUNTER — Other Ambulatory Visit: Payer: Self-pay

## 2023-12-12 ENCOUNTER — Encounter: Payer: Self-pay | Admitting: Infectious Disease

## 2023-12-12 ENCOUNTER — Ambulatory Visit (INDEPENDENT_AMBULATORY_CARE_PROVIDER_SITE_OTHER): Payer: Medicaid Other | Admitting: Infectious Disease

## 2023-12-12 VITALS — BP 122/78 | HR 88 | Temp 97.6°F | Ht 68.0 in | Wt 156.0 lb

## 2023-12-12 DIAGNOSIS — G43709 Chronic migraine without aura, not intractable, without status migrainosus: Secondary | ICD-10-CM | POA: Diagnosis not present

## 2023-12-12 DIAGNOSIS — B20 Human immunodeficiency virus [HIV] disease: Secondary | ICD-10-CM | POA: Diagnosis not present

## 2023-12-12 DIAGNOSIS — J4520 Mild intermittent asthma, uncomplicated: Secondary | ICD-10-CM | POA: Diagnosis not present

## 2023-12-12 DIAGNOSIS — G4700 Insomnia, unspecified: Secondary | ICD-10-CM

## 2023-12-12 DIAGNOSIS — R3915 Urgency of urination: Secondary | ICD-10-CM

## 2023-12-12 DIAGNOSIS — J4540 Moderate persistent asthma, uncomplicated: Secondary | ICD-10-CM | POA: Diagnosis not present

## 2023-12-12 DIAGNOSIS — N529 Male erectile dysfunction, unspecified: Secondary | ICD-10-CM

## 2023-12-12 DIAGNOSIS — N401 Enlarged prostate with lower urinary tract symptoms: Secondary | ICD-10-CM | POA: Diagnosis not present

## 2023-12-12 DIAGNOSIS — I6930 Unspecified sequelae of cerebral infarction: Secondary | ICD-10-CM | POA: Diagnosis not present

## 2023-12-12 DIAGNOSIS — F4310 Post-traumatic stress disorder, unspecified: Secondary | ICD-10-CM | POA: Diagnosis not present

## 2023-12-12 DIAGNOSIS — K449 Diaphragmatic hernia without obstruction or gangrene: Secondary | ICD-10-CM

## 2023-12-12 DIAGNOSIS — F319 Bipolar disorder, unspecified: Secondary | ICD-10-CM

## 2023-12-12 DIAGNOSIS — R112 Nausea with vomiting, unspecified: Secondary | ICD-10-CM

## 2023-12-12 DIAGNOSIS — K5090 Crohn's disease, unspecified, without complications: Secondary | ICD-10-CM | POA: Diagnosis not present

## 2023-12-12 DIAGNOSIS — F1090 Alcohol use, unspecified, uncomplicated: Secondary | ICD-10-CM

## 2023-12-12 DIAGNOSIS — N419 Inflammatory disease of prostate, unspecified: Secondary | ICD-10-CM | POA: Diagnosis not present

## 2023-12-12 DIAGNOSIS — R634 Abnormal weight loss: Secondary | ICD-10-CM

## 2023-12-12 DIAGNOSIS — E785 Hyperlipidemia, unspecified: Secondary | ICD-10-CM

## 2023-12-12 DIAGNOSIS — Z7689 Persons encountering health services in other specified circumstances: Secondary | ICD-10-CM | POA: Diagnosis not present

## 2023-12-12 MED ORDER — ATORVASTATIN CALCIUM 20 MG PO TABS: 20.0000 mg | ORAL_TABLET | Freq: Every day | ORAL | 11 refills | Status: DC

## 2023-12-12 NOTE — Progress Notes (Signed)
Subjective:  Chief complaint: follow-up for HIV disease on medications   Patient ID: Troy [name], male    DOB: [birth year], 53 y.o.   MRN: [id number]  HPI  Discussed the use of AI scribe software for clinical note transcription with the patient, who gave verbal consent to proceed.  History of Present Illness   The patient, with a history of HIV, bipolar disorder, asthma, and a history of Crohn's disease, presents with ongoing urological issues including prostate enlargement, erectile dysfunction, and suspected prostate infection. He reports pain and a burning sensation, and is currently waiting to see if symptoms improve before starting a course of antibiotics. He also mentions having kidney stones.  In addition, the patient has been seeing a psychiatrist and therapist for PTSD, which was recently diagnosed. He reports having no memories of his childhood or of the traumatic event that likely caused the PTSD, and also struggles to remember daily functions at work. Despite this, he feels that the PTSD diagnosis is not affecting him.  The patient also reports having gastroparesis, and is currently on a regimen of Biktarvy for HIV, Topamax for bipolar disorder, Abilify, Zoloft, and an asthma inhaler. He mentions that he has not been taking the [place] regularly, but is now back on track. He is also in the process of getting approved for a monthly injection for migraine prevention.       Past Medical History:  Diagnosis Date   Alcohol use disorder 12/11/2023   Asthma    Bipolar 1 disorder (HCC)    BPH (benign prostatic hyperplasia) 12/11/2023   Diabetes mellitus without complication (HCC)    controlled by weight loss   GERD (gastroesophageal reflux disease)    History of CVA with residual deficit    HIV infection (HCC)    Hyperlipidemia 08/22/2022   Migraine headache    2-3 X/month   Nephrolithiasis    PTSD (post-traumatic stress disorder) 12/11/2023   Wears dentures    full upper  and lower    Past Surgical History:  Procedure Laterality Date   COLONOSCOPY     COLONOSCOPY WITH PROPOFOL N/A 05/30/2019   Procedure: COLONOSCOPY WITH PROPOFOL;  Surgeon: Toney Reil, MD;  Location: [facility] ENDOSCOPY;  Service: Gastroenterology;  Laterality: N/A;   COLONOSCOPY WITH PROPOFOL N/A 03/22/2023   Procedure: COLONOSCOPY WITH PROPOFOL;  Surgeon: Toney Reil, MD;  Location: [facility] ENDOSCOPY;  Service: Gastroenterology;  Laterality: N/A;   ELBOW SURGERY Left    fracture   ESOPHAGOGASTRODUODENOSCOPY (EGD) WITH PROPOFOL N/A 05/30/2019   Procedure: ESOPHAGOGASTRODUODENOSCOPY (EGD) WITH PROPOFOL;  Surgeon: Toney Reil, MD;  Location: [facility] ENDOSCOPY;  Service: Gastroenterology;  Laterality: N/A;   ESOPHAGOGASTRODUODENOSCOPY (EGD) WITH PROPOFOL N/A 09/12/2019   Procedure: ESOPHAGOGASTRODUODENOSCOPY (EGD) WITH PROPOFOL;  Surgeon: Toney Reil, MD;  Location: [facility] ENDOSCOPY;  Service: Gastroenterology;  Laterality: N/A;   ESOPHAGOGASTRODUODENOSCOPY (EGD) WITH PROPOFOL N/A 11/23/2022   Procedure: ESOPHAGOGASTRODUODENOSCOPY (EGD) WITH PROPOFOL;  Surgeon: Toney Reil, MD;  Location: [facility] ENDOSCOPY;  Service: Gastroenterology;  Laterality: N/A;   FACIAL COSMETIC SURGERY     GASTRIC BYPASS     HERNIA REPAIR     KIDNEY STONE SURGERY     renal artery repair      Family History  Problem Relation Age of Onset   Bipolar disorder Mother    COPD Mother    Diabetes Mother    Hypertension Mother    Hyperlipidemia Mother    Healthy Father    Cancer Father  skin   Stroke Brother    Heart disease Brother    Seizures Brother    Thyroid disease Brother    Diabetes Maternal Grandfather    Hyperlipidemia Maternal Grandfather    Cancer Maternal Grandmother        breast   Hyperlipidemia Maternal Grandmother    Hypertension Maternal Grandmother    Heart disease Paternal Grandfather    Hypertension Paternal Grandfather       Social History   Socioeconomic  History   Marital status: Single    Spouse name: Not on file   Number of children: 0   Years of education: Not on file   Highest education level: Some college, no degree  Occupational History   Occupation: IT sales professional: [employer]  Tobacco Use   Smoking status: Every Day    Types: E-cigarettes   Smokeless tobacco: Never   Tobacco comments:    Currently only vapes  Vaping Use   Vaping status: Every Day   Substances: Nicotine, Flavoring   Devices: Mod  Substance and Sexual Activity   Alcohol use: Not Currently    Comment: 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, active in [place]   Drug use: Not Currently    Comment: previously used, no IV drugs, 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   Sexual activity: Not Currently    Partners: Male    Comment: patient given condoms  Other Topics Concern   Not on file  Social History Narrative   Not on file   Social Drivers of Health   Financial Resource Strain: High Risk (10/05/2023)   Overall Financial Resource Strain (CARDIA)    Difficulty of Paying Living Expenses: Very hard  Food Insecurity: Food Insecurity Present (10/05/2023)   Hunger Vital Sign    Worried About Running Out of Food in the Last Year: Often true    Ran Out of Food in the Last Year: Often true  Transportation Needs: Unmet Transportation Needs (10/05/2023)   PRAPARE - Administrator, Civil Service (Medical): No    Lack of Transportation (Non-Medical): Yes  Physical Activity: Sufficiently Active (10/05/2023)   Exercise Vital Sign    Days of Exercise per Week: 7 days    Minutes of Exercise per Session: 90 min  Stress: Stress Concern Present (10/05/2023)   [employer] of Occupational Health - Occupational Stress Questionnaire    Feeling of Stress : Very much  Social Connections: Socially Isolated (04/18/2023)   Social Connection and Isolation Panel [NHANES]    Frequency of Communication with Friends and Family: Once a week    Frequency of Social Gatherings with Friends and Family: Never     Attends Religious Services: Never    Database administrator or Organizations: No    Attends Engineer, structural: Not on file    Marital Status: Divorced    Allergies  Allergen Reactions   Niacin Anaphylaxis   Orange Oil Anaphylaxis   Zolpidem     Other reaction(s): Unknown Sleep walking and sleep eating     Current Outpatient Medications:    albuterol (VENTOLIN HFA) 108 (90 Base) MCG/ACT inhaler, Inhale 2 puffs into the lungs every 6 (six) hours as needed for wheezing or shortness of breath., Disp: 8 g, Rfl: 2   ARIPiprazole (ABILIFY) 15 MG tablet, Take 1 tablet (15 mg total) by mouth daily., Disp: 90 tablet, Rfl: 0   atorvastatin (LIPITOR) 20 MG tablet, Take 1 tablet (20 mg total) by mouth daily., Disp: 30 tablet, Rfl: 11  beclomethasone (QVAR) 80 MCG/ACT inhaler, Inhale 1 puff into the lungs 2 (two) times daily., Disp: 1 each, Rfl: 2   bictegravir-emtricitabine-tenofovir AF (BIKTARVY) 50-200-25 MG TABS tablet, Take 1 tablet by mouth daily., Disp: 30 tablet, Rfl: 11   Budeson-Glycopyrrol-Formoterol (BREZTRI AEROSPHERE) 160-9-4.8 MCG/ACT AERO, Inhale 2 puffs into the lungs 2 (two) times daily., Disp: 10.7 g, Rfl: 11   cetirizine (ZYRTEC) 10 MG tablet, Take 1 tablet (10 mg total) by mouth daily., Disp: 90 tablet, Rfl: 3   eszopiclone 3 MG TABS, Take 1 tablet (3 mg total) by mouth at bedtime as needed. Take immediately before bedtime, Disp: 30 tablet, Rfl: 0   fluticasone (FLONASE) 50 MCG/ACT nasal spray, Place 2 sprays into both nostrils daily., Disp: 16 g, Rfl: 6   metoCLOPramide (REGLAN) 5 MG tablet, Take 1 tablet (5 mg total) by mouth 4 (four) times daily -  before meals and at bedtime., Disp: 120 tablet, Rfl: 0   montelukast (SINGULAIR) 10 MG tablet, TAKE 1 TABLET BY MOUTH EVERY DAY, Disp: 90 tablet, Rfl: 3   naproxen (NAPROSYN) 375 MG tablet, Take 375 mg by mouth 2 (two) times daily., Disp: , Rfl:    omeprazole (PRILOSEC) 40 MG capsule, Take 1 capsule (40 mg total) by  mouth in the morning and at bedtime., Disp: 180 capsule, Rfl: 0   ondansetron (ZOFRAN-ODT) 4 MG disintegrating tablet, Take 1 tablet (4 mg total) by mouth every 8 (eight) hours as needed., Disp: 30 tablet, Rfl: 0   potassium citrate (UROCIT-K) 10 MEQ (1080 MG) SR tablet, Take 1 tablet (10 mEq total) by mouth 3 (three) times daily with meals., Disp: 90 tablet, Rfl: 11   sertraline (ZOLOFT) 100 MG tablet, Take 1.5 tablets (150 mg total) by mouth at bedtime., Disp: 135 tablet, Rfl: 0   SUMAtriptan (IMITREX) 100 MG tablet, TAKE ONE TABLET BY MOUTH AT ONSET OF THE HEADACHE. MAY REPEAT IN TWO HOURS, Disp: 10 tablet, Rfl: 3   tadalafil (CIALIS) 5 MG tablet, Take 1 tablet (5 mg total) by mouth daily., Disp: 30 tablet, Rfl: 11   tamsulosin (FLOMAX) 0.4 MG CAPS capsule, Take 0.4 mg by mouth daily., Disp: , Rfl:    mupirocin ointment (BACTROBAN) 2 %, Apply 1 Application topically 2 (two) times daily. For 10 days (Patient not taking: Reported on 12/12/2023), Disp: 22 g, Rfl: 0   tamsulosin (FLOMAX) 0.4 MG CAPS capsule, Take 2 capsules (0.8 mg total) by mouth daily. (Patient not taking: Reported on 12/12/2023), Disp: 60 capsule, Rfl: 0   Review of Systems  Constitutional:  Negative for activity change, appetite change, chills, diaphoresis, fatigue, fever and unexpected weight change.  HENT:  Negative for congestion, rhinorrhea, sinus pressure, sneezing, sore throat and trouble swallowing.   Eyes:  Negative for photophobia and visual disturbance.  Respiratory:  Negative for cough, chest tightness, shortness of breath, wheezing and stridor.   Cardiovascular:  Negative for chest pain, palpitations and leg swelling.  Gastrointestinal:  Positive for abdominal pain. Negative for abdominal distention, anal bleeding, blood in stool, constipation, diarrhea, nausea and vomiting.  Genitourinary:  Negative for difficulty urinating, dysuria, flank pain and hematuria.  Musculoskeletal:  Negative for arthralgias, back pain,  gait problem, joint swelling and myalgias.  Skin:  Negative for color change, pallor, rash and wound.  Neurological:  Negative for dizziness, tremors, weakness and light-headedness.  Hematological:  Negative for adenopathy. Does not bruise/bleed easily.  Psychiatric/Behavioral:  Negative for agitation, behavioral problems, confusion, decreased concentration, dysphoric mood and sleep disturbance.  Objective:   Physical Exam Constitutional:      Appearance: He is well-developed.  HENT:     Head: Normocephalic and atraumatic.  Eyes:     Conjunctiva/sclera: Conjunctivae normal.  Cardiovascular:     Rate and Rhythm: Normal rate and regular rhythm.  Pulmonary:     Effort: Pulmonary effort is normal. No respiratory distress.     Breath sounds: No wheezing.  Abdominal:     General: There is no distension.     Palpations: Abdomen is soft.  Musculoskeletal:        General: No tenderness. Normal range of motion.     Cervical back: Normal range of motion and neck supple.  Skin:    General: Skin is warm and dry.     Coloration: Skin is not pale.     Findings: No erythema or rash.  Neurological:     General: No focal deficit present.     Mental Status: He is alert and oriented to person, place, and time.  Psychiatric:        Mood and Affect: Mood normal.        Behavior: Behavior normal.        Thought Content: Thought content normal.        Judgment: Judgment normal.           Assessment & Plan:   Assessment and Plan    HIV Patient reports inconsistent use of Biktarvy, but recent labs showed viral load less than 20. -Continue Biktarvy as prescribed and encourage consistent use --check HIV RNA, CD4     Prostatitis Patient is currently waiting to see if symptoms of pain and burning sensation resolve within two weeks, as advised by urologist. If not, the plan is to start antibiotics, likely Levofloxacin. -Continue current plan as advised by urologist.  Erectile  Dysfunction Patient reports experiencing erectile dysfunction. -Continue current management plan as advised by urologist.  Post-Traumatic Stress Disorder (PTSD) Patient has been diagnosed with PTSD by psychiatrist and therapist, but patient is unsure about the diagnosis. No recurrent memories of the traumatic event. -Continue current management plan with psychiatrist and therapist.  .  Asthma Patient is currently on a steroid inhaler and albuterol. -Continue current management plan.  Hyperlipidemia Patient is not currently on atorvastatin. -Resume atorvastatin, prescription to be sent to [facility].    Follow-up in 4 months.

## 2023-12-13 ENCOUNTER — Encounter: Payer: Self-pay | Admitting: Psychiatry

## 2023-12-13 ENCOUNTER — Telehealth (INDEPENDENT_AMBULATORY_CARE_PROVIDER_SITE_OTHER): Payer: Medicaid Other | Admitting: Psychiatry

## 2023-12-13 ENCOUNTER — Other Ambulatory Visit: Payer: Medicaid Other

## 2023-12-13 DIAGNOSIS — F1011 Alcohol abuse, in remission: Secondary | ICD-10-CM | POA: Diagnosis not present

## 2023-12-13 DIAGNOSIS — F1220 Cannabis dependence, uncomplicated: Secondary | ICD-10-CM

## 2023-12-13 DIAGNOSIS — F4310 Post-traumatic stress disorder, unspecified: Secondary | ICD-10-CM

## 2023-12-13 DIAGNOSIS — G4700 Insomnia, unspecified: Secondary | ICD-10-CM

## 2023-12-13 DIAGNOSIS — R413 Other amnesia: Secondary | ICD-10-CM | POA: Diagnosis not present

## 2023-12-13 DIAGNOSIS — F319 Bipolar disorder, unspecified: Secondary | ICD-10-CM

## 2023-12-13 MED ORDER — ESZOPICLONE 3 MG PO TABS
3.0000 mg | ORAL_TABLET | Freq: Every evening | ORAL | 1 refills | Status: DC | PRN
Start: 2024-01-05 — End: 2024-03-21

## 2023-12-13 NOTE — Patient Instructions (Signed)
Continue Abilify 15 mg  Obtain EKG  Continue sertraline 150 mg at night  Continue Lunesta 3 mg at night as needed for sleep Obtain lab- vitamin B 1, folic acid Obtain head CT Next appointment: 4/17 at 1 pm

## 2023-12-14 LAB — RPR: RPR Ser Ql: NONREACTIVE

## 2023-12-14 LAB — COMPLETE METABOLIC PANEL WITH GFR
AG Ratio: 2.2 (calc) (ref 1.0–2.5)
ALT: 13 U/L (ref 9–46)
AST: 17 U/L (ref 10–35)
Albumin: 4.3 g/dL (ref 3.6–5.1)
Alkaline phosphatase (APISO): 61 U/L (ref 35–144)
BUN: 11 mg/dL (ref 7–25)
CO2: 29 mmol/L (ref 20–32)
Calcium: 9.3 mg/dL (ref 8.6–10.3)
Chloride: 102 mmol/L (ref 98–110)
Creat: 1.22 mg/dL (ref 0.70–1.30)
Globulin: 2 g/dL (ref 1.9–3.7)
Glucose, Bld: 118 mg/dL — ABNORMAL HIGH (ref 65–99)
Potassium: 4.1 mmol/L (ref 3.5–5.3)
Sodium: 139 mmol/L (ref 135–146)
Total Bilirubin: 0.6 mg/dL (ref 0.2–1.2)
Total Protein: 6.3 g/dL (ref 6.1–8.1)
eGFR: 72 mL/min/{1.73_m2} (ref >=60)

## 2023-12-14 LAB — T-HELPER CELLS (CD4) COUNT (NOT AT ARMC)
Absolute CD4: 492 {cells}/uL (ref 490–1740)
CD4 T Helper %: 32 % (ref 30–61)
Total lymphocyte count: 1535 {cells}/uL (ref 850–3900)

## 2023-12-14 LAB — HIV-1 RNA QUANT-NO REFLEX-BLD
HIV 1 RNA Quant: NOT DETECTED {copies}/mL
HIV-1 RNA Quant, Log: NOT DETECTED {Log}

## 2023-12-14 LAB — CBC WITH DIFFERENTIAL/PLATELET
Absolute Lymphocytes: 1717 {cells}/uL (ref 850–3900)
Absolute Monocytes: 459 {cells}/uL (ref 200–950)
Basophils Absolute: 22 {cells}/uL (ref 0–200)
Basophils Relative: 0.4 %
Eosinophils Absolute: 49 {cells}/uL (ref 15–500)
Eosinophils Relative: 0.9 %
HCT: 43.9 % (ref 38.5–50.0)
Hemoglobin: 14.5 g/dL (ref 13.2–17.1)
MCH: 30.7 pg (ref 27.0–33.0)
MCHC: 33 g/dL (ref 32.0–36.0)
MCV: 92.8 fL (ref 80.0–100.0)
MPV: 11.6 fL (ref 7.5–12.5)
Monocytes Relative: 8.5 %
Neutro Abs: 3154 {cells}/uL (ref 1500–7800)
Neutrophils Relative %: 58.4 %
Platelets: 142 10*3/uL (ref 140–400)
RBC: 4.73 10*6/uL (ref 4.20–5.80)
RDW: 11.9 % (ref 11.0–15.0)
Total Lymphocyte: 31.8 %
WBC: 5.4 10*3/uL (ref 3.8–10.8)

## 2023-12-19 ENCOUNTER — Other Ambulatory Visit (HOSPITAL_COMMUNITY): Payer: Self-pay

## 2023-12-19 ENCOUNTER — Encounter: Payer: Self-pay | Admitting: Gastroenterology

## 2023-12-19 DIAGNOSIS — R112 Nausea with vomiting, unspecified: Secondary | ICD-10-CM

## 2023-12-19 DIAGNOSIS — R634 Abnormal weight loss: Secondary | ICD-10-CM

## 2023-12-19 DIAGNOSIS — K449 Diaphragmatic hernia without obstruction or gangrene: Secondary | ICD-10-CM

## 2023-12-20 ENCOUNTER — Ambulatory Visit (INDEPENDENT_AMBULATORY_CARE_PROVIDER_SITE_OTHER): Payer: Medicaid Other

## 2023-12-20 ENCOUNTER — Telehealth: Payer: Self-pay | Admitting: Physician Assistant

## 2023-12-20 ENCOUNTER — Other Ambulatory Visit (HOSPITAL_COMMUNITY): Payer: Self-pay

## 2023-12-20 ENCOUNTER — Other Ambulatory Visit: Payer: Medicaid Other

## 2023-12-20 DIAGNOSIS — Z7689 Persons encountering health services in other specified circumstances: Secondary | ICD-10-CM | POA: Diagnosis not present

## 2023-12-20 DIAGNOSIS — E538 Deficiency of other specified B group vitamins: Secondary | ICD-10-CM | POA: Diagnosis not present

## 2023-12-20 DIAGNOSIS — N411 Chronic prostatitis: Secondary | ICD-10-CM

## 2023-12-20 LAB — URINALYSIS, COMPLETE
Bilirubin, UA: NEGATIVE
Glucose, UA: NEGATIVE
Ketones, UA: NEGATIVE
Leukocytes,UA: NEGATIVE
Nitrite, UA: NEGATIVE
Specific Gravity, UA: >1.03 — ABNORMAL HIGH (ref 1.005–1.030)
Urobilinogen, Ur: 2 mg/dL — ABNORMAL HIGH (ref 0.2–1.0)
pH, UA: 6.5 (ref 5.0–7.5)

## 2023-12-20 LAB — MICROSCOPIC EXAMINATION

## 2023-12-20 MED ORDER — OMEPRAZOLE 40 MG PO CPDR
40.0000 mg | DELAYED_RELEASE_CAPSULE | Freq: Two times a day (BID) | ORAL | 0 refills | Status: DC
  Filled 2023-12-20 – 2024-01-08 (×4): qty 60, 30d supply, fill #0

## 2023-12-20 MED ORDER — CYANOCOBALAMIN 1000 MCG/ML IJ SOLN
1000.0000 ug | Freq: Once | INTRAMUSCULAR | Status: AC
  Administered 2023-12-20: 1000 ug via INTRAMUSCULAR

## 2023-12-20 NOTE — Telephone Encounter (Signed)
 Has appointment 01/16/2024 Last office visit 10/26/2022 nausea and vomiting  Last refill 02/15/2023 180 capsule 0 refills  Last EGD 11/23/2022

## 2023-12-20 NOTE — Progress Notes (Signed)
 Patient missed the last 2 Vit B 12 injections.  I have explained to the patient he will need to start the series of 1 q week x 4 again and will count today's injection as #1. Patient to get f/u injection in 1 week. Patient did mention that he had another one of his spells where he slept for 3 days due to his B12 being low.  Educated on the importance of keeping these appointments.

## 2023-12-20 NOTE — Telephone Encounter (Signed)
 Pt came in to drop off urine sample.  He says he thought the stone moved from kidney to tube.  He said Sam would know what he was talking about.

## 2023-12-21 ENCOUNTER — Ambulatory Visit (INDEPENDENT_AMBULATORY_CARE_PROVIDER_SITE_OTHER): Payer: Medicaid Other | Admitting: Professional Counselor

## 2023-12-21 DIAGNOSIS — F3131 Bipolar disorder, current episode depressed, mild: Secondary | ICD-10-CM

## 2023-12-21 DIAGNOSIS — F50022 Anorexia nervosa, binge eating/purging type, severe: Secondary | ICD-10-CM

## 2023-12-21 DIAGNOSIS — Z7689 Persons encountering health services in other specified circumstances: Secondary | ICD-10-CM | POA: Diagnosis not present

## 2023-12-21 MED ORDER — VERAPAMIL HCL ER 120 MG PO TBCR: 120.0000 mg | EXTENDED_RELEASE_TABLET | Freq: Every day | ORAL | 2 refills | Status: DC

## 2023-12-21 NOTE — Progress Notes (Unsigned)
 THERAPIST PROGRESS NOTE  Session Time: 9:00 AM - 9:53 AM   Participation Level: Active  Behavioral Response: CasualAlertAnxious  Type of Therapy: Individual Therapy  Treatment Goals addressed: Active BH Bipolar disorder LTG: "I want to raise the value of myself because I don't think I value enough. I want to raise my standards in men, but that comes back to my value because that's why I date bad boys."              Dates:  Start:  10/19/23    Expected End:  10/17/24      STG: Troy Carnes "Troy [name]" will identify cognitive patterns and beliefs that contribute to symptoms and work towards restructuring those beliefs over the next 12 weeks.     STG: "I want to work on the PTSD." Troy [name] will reduce the impact of trauma as evidenced by processing the event, identifying stuck points, and restructuring unhealthy thinking patterns around it over the next 12 weeks.                Intervention: Jotham "Troy [name]" will identify his self-destructive behaviors (such as promiscuity, substance abuse, and aggression) and implement behavior change techniques over the next 12 weeks.   ProgressTowards Goals: Progressing  Interventions: CBT, Motivational Interviewing, and Supportive  Summary: Troy [name] is a 53 y.o. male who presents with a history of bipolar disorder and PTSD. He appeared anxious but oriented x5. Troy [name] reported he is still unemployed but getting benefits now. He stated that has made things better with his roommate since he's able to pay almost half of the rent. 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 He engaged in discussed about triggers for this behavior and ways to reduce or abstain from this behavior. He noted what worked for him when he quit drinking. Troy [name] expressed concerns about his health. He shared a significant history of binging and purging with minimal eating other times. Troy [name] noted he has not shared this information before for fear of embarrassment or judgement. He is not sure he is  ready to seek direct treatment for this but was open to sharing with his medical doctors in order to receive medical care for damage it may have caused. Troy [name] stated he may be open to a support group as well and will follow-up on resources provided by this Clinical research associate. He understands the need to refer out and information shared today will be documented in his record.   Therapist Response: Conducted session with [employer]. Began session with check-in/update since previous session. Utilized empathetic and reflective listening. Used MI skills as Troy [name] discussed his continued use of sex apps. Identified skills that worked in the past such as replacement activities. Explained concept of urge surfing as well. Provided safe space for Troy [name] to share information about his eating disorder behavior. Discussed stages of change and used MI skills to reinforce Troy [name]'s want to address his medical concerns. Reviewed potential risks from ongoing eating disorder behavior. Explained the need to refer Troy [name] out for specialized treatment for eating disorder and offered resources for treatment via support groups. Encouraged Troy [name] to share this information with his medical doctors and made him aware it would be noted in his record today.   Suicidal/Homicidal: No  Plan: Return again in 2 weeks.  Diagnosis: Bipolar affective disorder, currently depressed, mild (HCC)  Severe binge-eating purging type anorexia nervosa  Collaboration of Care: Medication Management AEB chart review  Patient/Guardian was advised Release of Information must be obtained prior to any record release in order  to collaborate their care with an outside provider. Patient/Guardian was advised if they have not already done so to contact the registration department to sign all necessary forms in order for [place] to release information regarding their care.   Consent: Patient/Guardian gives verbal consent for treatment and assignment of benefits for services provided  during this visit. Patient/Guardian expressed understanding and agreed to proceed.   Edmonia Lynch, [facility] 12/22/2023

## 2023-12-22 ENCOUNTER — Other Ambulatory Visit (HOSPITAL_COMMUNITY): Payer: Self-pay

## 2023-12-23 DIAGNOSIS — Z419 Encounter for procedure for purposes other than remedying health state, unspecified: Secondary | ICD-10-CM | POA: Diagnosis not present

## 2023-12-25 ENCOUNTER — Encounter: Payer: Self-pay | Admitting: Physician Assistant

## 2023-12-25 ENCOUNTER — Ambulatory Visit
Admission: RE | Admit: 2023-12-25 | Discharge: 2023-12-25 | Disposition: A | Discharge status: Home / Self Care | Source: Ambulatory Visit | Attending: Physician Assistant | Admitting: Physician Assistant

## 2023-12-25 ENCOUNTER — Ambulatory Visit: Payer: Medicaid Other | Admitting: Physician Assistant

## 2023-12-25 VITALS — BP 114/79 | HR 101 | Ht 68.0 in | Wt 145.0 lb

## 2023-12-25 DIAGNOSIS — Z87442 Personal history of urinary calculi: Secondary | ICD-10-CM

## 2023-12-25 DIAGNOSIS — K5641 Fecal impaction: Secondary | ICD-10-CM | POA: Diagnosis not present

## 2023-12-25 DIAGNOSIS — R109 Unspecified abdominal pain: Secondary | ICD-10-CM | POA: Diagnosis not present

## 2023-12-25 DIAGNOSIS — Z7689 Persons encountering health services in other specified circumstances: Secondary | ICD-10-CM | POA: Diagnosis not present

## 2023-12-25 DIAGNOSIS — N2889 Other specified disorders of kidney and ureter: Secondary | ICD-10-CM | POA: Diagnosis not present

## 2023-12-25 LAB — URINALYSIS, COMPLETE
Bilirubin, UA: NEGATIVE
Glucose, UA: NEGATIVE
Ketones, UA: NEGATIVE
Leukocytes,UA: NEGATIVE
Nitrite, UA: NEGATIVE
RBC, UA: NEGATIVE
Specific Gravity, UA: >1.03 — ABNORMAL HIGH (ref 1.005–1.030)
Urobilinogen, Ur: 1 mg/dL (ref 0.2–1.0)
pH, UA: 5 (ref 5.0–7.5)

## 2023-12-25 LAB — MICROSCOPIC EXAMINATION

## 2023-12-25 NOTE — Progress Notes (Signed)
 12/25/2023 4:52 PM   Troy [name] 09-Feb-1971 [id number]  CC: Chief Complaint  Patient presents with   Nephrolithiasis   HPI: Troy [name] is a 53 y.o. male with PMH recurrent nephrolithiasis on potassium citrate, diabetes with gastroparesis, CVA, BPH with hesitancy on Flomax and tadalafil 5 mg, chronic prostatitis, family history of prostate cancer, and ED who presents today for evaluation of a possible acute stone episode.   He had a lab visit for UA drop-off with Troy [name] last week in the setting of treatment for chronic prostatitis.  At that time, he was found to have microscopic hematuria with 3-10 RBCs/hpf.  Today he reports right flank pain the same day as his urine drop-off that has since resolved.  He has had dysuria today but has not seen a stone pass.  In-office UA today positive for 1+ protein; urine microscopy pan negative.  PMH: Past Medical History:  Diagnosis Date   Alcohol use disorder 12/11/2023   Asthma    Bipolar 1 disorder (HCC)    BPH (benign prostatic hyperplasia) 12/11/2023   Diabetes mellitus without complication (HCC)    controlled by weight loss   GERD (gastroesophageal reflux disease)    History of CVA with residual deficit    HIV infection (HCC)    Hyperlipidemia 08/22/2022   Migraine headache    2-3 X/month   Nephrolithiasis    PTSD (post-traumatic stress disorder) 12/11/2023   Wears dentures    full upper and lower    Surgical History: Past Surgical History:  Procedure Laterality Date   COLONOSCOPY     COLONOSCOPY WITH PROPOFOL N/A 05/30/2019   Procedure: COLONOSCOPY WITH PROPOFOL;  Surgeon: Toney Reil, MD;  Location: [facility] ENDOSCOPY;  Service: Gastroenterology;  Laterality: N/A;   COLONOSCOPY WITH PROPOFOL N/A 03/22/2023   Procedure: COLONOSCOPY WITH PROPOFOL;  Surgeon: Toney Reil, MD;  Location: [facility] ENDOSCOPY;  Service: Gastroenterology;  Laterality: N/A;   ELBOW SURGERY Left    fracture   ESOPHAGOGASTRODUODENOSCOPY (EGD)  WITH PROPOFOL N/A 05/30/2019   Procedure: ESOPHAGOGASTRODUODENOSCOPY (EGD) WITH PROPOFOL;  Surgeon: Toney Reil, MD;  Location: [facility] ENDOSCOPY;  Service: Gastroenterology;  Laterality: N/A;   ESOPHAGOGASTRODUODENOSCOPY (EGD) WITH PROPOFOL N/A 09/12/2019   Procedure: ESOPHAGOGASTRODUODENOSCOPY (EGD) WITH PROPOFOL;  Surgeon: Toney Reil, MD;  Location: [facility] ENDOSCOPY;  Service: Gastroenterology;  Laterality: N/A;   ESOPHAGOGASTRODUODENOSCOPY (EGD) WITH PROPOFOL N/A 11/23/2022   Procedure: ESOPHAGOGASTRODUODENOSCOPY (EGD) WITH PROPOFOL;  Surgeon: Toney Reil, MD;  Location: [facility] ENDOSCOPY;  Service: Gastroenterology;  Laterality: N/A;   FACIAL COSMETIC SURGERY     GASTRIC BYPASS     HERNIA REPAIR     KIDNEY STONE SURGERY     renal artery repair      Home Medications:  Allergies as of 12/25/2023       Reactions   Niacin Anaphylaxis   Orange Oil Anaphylaxis   Zolpidem    Other reaction(s): Unknown Sleep walking and sleep eating        Medication List        Accurate as of December 25, 2023  4:52 PM. If you have any questions, ask your nurse or doctor.          albuterol 108 (90 Base) MCG/ACT inhaler Commonly known as: VENTOLIN HFA Inhale 2 puffs into the lungs every 6 (six) hours as needed for wheezing or shortness of breath.   ARIPiprazole 15 MG tablet Commonly known as: ABILIFY Take 1 tablet (15 mg total) by mouth daily.   atorvastatin 20  MG tablet Commonly known as: Lipitor Take 1 tablet (20 mg total) by mouth daily.   beclomethasone 80 MCG/ACT inhaler Commonly known as: QVAR Inhale 1 puff into the lungs 2 (two) times daily.   Biktarvy 50-200-25 MG Tabs tablet Generic drug: bictegravir-emtricitabine-tenofovir AF Take 1 tablet by mouth daily.   Breztri Aerosphere 160-9-4.8 MCG/ACT Aero Generic drug: Budeson-Glycopyrrol-Formoterol Inhale 2 puffs into the lungs 2 (two) times daily.   cetirizine 10 MG tablet Commonly known as: ZYRTEC Take 1  tablet (10 mg total) by mouth daily.   Eszopiclone 3 MG Tabs Commonly known as: eszopiclone Take 1 tablet (3 mg total) by mouth at bedtime as needed. Take immediately before bedtime Start taking on: January 05, 2024   fluticasone 50 MCG/ACT nasal spray Commonly known as: FLONASE Place 2 sprays into both nostrils daily.   metoCLOPramide 5 MG tablet Commonly known as: Reglan Take 1 tablet (5 mg total) by mouth 4 (four) times daily -  before meals and at bedtime.   montelukast 10 MG tablet Commonly known as: SINGULAIR TAKE 1 TABLET BY MOUTH EVERY DAY   mupirocin ointment 2 % Commonly known as: BACTROBAN Apply 1 Application topically 2 (two) times daily. For 10 days   naproxen 375 MG tablet Commonly known as: NAPROSYN TAKE 1 TABLET (375 MG TOTAL) BY MOUTH 2 (TWO) TIMES DAILY WITH A MEAL.   omeprazole 40 MG capsule Commonly known as: PRILOSEC Take 1 capsule (40 mg total) by mouth in the morning and at bedtime.   ondansetron 4 MG disintegrating tablet Commonly known as: ZOFRAN-ODT Take 1 tablet (4 mg total) by mouth every 8 (eight) hours as needed.   potassium citrate 10 MEQ (1080 MG) SR tablet Commonly known as: UROCIT-K Take 1 tablet (10 mEq total) by mouth 3 (three) times daily with meals.   sertraline 100 MG tablet Commonly known as: ZOLOFT Take 1.5 tablets (150 mg total) by mouth at bedtime.   SUMAtriptan 100 MG tablet Commonly known as: IMITREX TAKE ONE TABLET BY MOUTH AT ONSET OF THE HEADACHE. MAY REPEAT IN TWO HOURS   tadalafil 5 MG tablet Commonly known as: CIALIS Take 1 tablet (5 mg total) by mouth daily.   tamsulosin 0.4 MG Caps capsule Commonly known as: FLOMAX Take 0.4 mg by mouth daily.   tamsulosin 0.4 MG Caps capsule Commonly known as: FLOMAX Take 2 capsules (0.8 mg total) by mouth daily.   verapamil 120 MG CR tablet Commonly known as: CALAN-SR Take 1 tablet (120 mg total) by mouth at bedtime.        Allergies:  Allergies  Allergen  Reactions   Niacin Anaphylaxis   Orange Oil Anaphylaxis   Zolpidem     Other reaction(s): Unknown Sleep walking and sleep eating    Family History: Family History  Problem Relation Age of Onset   Bipolar disorder Mother    COPD Mother    Diabetes Mother    Hypertension Mother    Hyperlipidemia Mother    Healthy Father    Cancer Father        skin   Stroke Brother    Heart disease Brother    Seizures Brother    Thyroid disease Brother    Diabetes Maternal Grandfather    Hyperlipidemia Maternal Grandfather    Cancer Maternal Grandmother        breast   Hyperlipidemia Maternal Grandmother    Hypertension Maternal Grandmother    Heart disease Paternal Grandfather    Hypertension Paternal Actor  Social History:   reports that he has been smoking e-cigarettes. He has never used smokeless tobacco. He reports that he does not currently use alcohol. He reports that he does not currently use drugs.  Physical Exam: BP 114/79   Pulse (!) 101   Ht 5\' 8"  (1.727 m)   Wt 145 lb (65.8 kg)   BMI 22.05 kg/m   Constitutional:  Alert and oriented, no acute distress, nontoxic appearing HEENT: [place], AT Cardiovascular: No clubbing, cyanosis, or edema Respiratory: Normal respiratory effort, no increased work of breathing Skin: No rashes, bruises or suspicious lesions Neurologic: Grossly intact, no focal deficits, moving all 4 extremities Psychiatric: Normal mood and affect  Laboratory Data: Results for orders placed or performed in visit on 12/25/23  Microscopic Examination   Collection Time: 12/25/23  3:21 PM   Urine  Result Value Ref Range   WBC, UA 0-5 0 - 5 /hpf   RBC, Urine 0-2 0 - 2 /hpf   Epithelial Cells (non renal) 0-10 0 - 10 /hpf   Mucus, UA Present (A) Not Estab.   Bacteria, UA Few None seen/Few  Urinalysis, Complete   Collection Time: 12/25/23  3:21 PM  Result Value Ref Range   Specific Gravity, UA >1.030 (H) 1.005 - 1.030   pH, UA 5.0 5.0 - 7.5   Color,  UA Amber (A) Yellow   Appearance Ur Clear Clear   Leukocytes,UA Negative Negative   Protein,UA 1+ (A) Negative/Trace   Glucose, UA Negative Negative   Ketones, UA Negative Negative   RBC, UA Negative Negative   Bilirubin, UA Negative Negative   Urobilinogen, Ur 1.0 0.2 - 1.0 mg/dL   Nitrite, UA Negative Negative   Microscopic Examination See below:    Assessment & Plan:   1. Flank pain with history of urolithiasis (Primary) Right flank pain and microscopic hematuria last week with dysuria this morning consistent with acute stone episode with either recent or imminent passage.  UA today is bland.  I asked him to go get a KUB today and will call him with his results.  He is in agreement with this plan. - Urinalysis, Complete - DG Abd 1 View; Future  Return for Will call with results.  Carman Ching, PA-C  [facility] [place] [address] [place], [place] [address] [phone]

## 2023-12-26 DIAGNOSIS — Z7689 Persons encountering health services in other specified circumstances: Secondary | ICD-10-CM | POA: Diagnosis not present

## 2023-12-26 DIAGNOSIS — H5213 Myopia, bilateral: Secondary | ICD-10-CM | POA: Diagnosis not present

## 2023-12-27 ENCOUNTER — Ambulatory Visit: Payer: Medicaid Other

## 2023-12-27 ENCOUNTER — Encounter: Payer: Self-pay | Admitting: Podiatry

## 2023-12-27 ENCOUNTER — Ambulatory Visit (INDEPENDENT_AMBULATORY_CARE_PROVIDER_SITE_OTHER): Payer: Medicaid Other | Admitting: Podiatry

## 2023-12-27 DIAGNOSIS — E538 Deficiency of other specified B group vitamins: Secondary | ICD-10-CM | POA: Diagnosis not present

## 2023-12-27 DIAGNOSIS — L84 Corns and callosities: Secondary | ICD-10-CM

## 2023-12-27 DIAGNOSIS — M2011 Hallux valgus (acquired), right foot: Secondary | ICD-10-CM

## 2023-12-27 DIAGNOSIS — Z7689 Persons encountering health services in other specified circumstances: Secondary | ICD-10-CM | POA: Diagnosis not present

## 2023-12-27 MED ORDER — CYANOCOBALAMIN 1000 MCG/ML IJ SOLN
1000.0000 ug | Freq: Once | INTRAMUSCULAR | Status: AC
Start: 2023-12-27 — End: 2023-12-27
  Administered 2023-12-27: 1000 ug via INTRAMUSCULAR

## 2023-12-27 NOTE — Progress Notes (Signed)
  Subjective:  Patient ID: Troy [name], male    DOB: [birth year],  MRN: [id number]  Chief Complaint  Patient presents with   Foot Pain    "I have a corn, it's huge, and hurts like hell!"    53 y.o. male presents with the above complaint. History confirmed with patient.   Objective:  Physical Exam: warm, good capillary refill, no trophic changes or ulcerative lesions, normal DP and PT pulses, normal sensory exam, and medial pinch callus right hallux.  Assessment:   1. Callus of foot   2. Hallux interphalangeus, acquired, right      Plan:  Patient was evaluated and treated and all questions answered.  Discussed etiology and treatment options.  He has hallux interphalangeus contributing to this.  I debrided the callus as a courtesy with a sharp scalpel and applied salinocaine ointment.  Advised to use salicylic acid and/or urea cream and pumice stone to prevent recurrence.  Return as needed.  Return if symptoms worsen or fail to improve.

## 2023-12-27 NOTE — Progress Notes (Signed)
 Patient give B12 1000 mcg IM without adverse or allergic reaction

## 2023-12-27 NOTE — Patient Instructions (Signed)
 Look for salicylic acid  40%  medicated pads cream or ointment and apply to the thickened dry skin / calluses. This can be bought over the counter, at a pharmacy or online such as [employer]. May be called extra or max strength corn or wart removers

## 2023-12-31 ENCOUNTER — Other Ambulatory Visit: Payer: Self-pay | Admitting: Physician Assistant

## 2023-12-31 DIAGNOSIS — N411 Chronic prostatitis: Secondary | ICD-10-CM

## 2024-01-01 ENCOUNTER — Other Ambulatory Visit (HOSPITAL_COMMUNITY): Payer: Self-pay

## 2024-01-02 ENCOUNTER — Other Ambulatory Visit (HOSPITAL_COMMUNITY): Payer: Self-pay

## 2024-01-02 ENCOUNTER — Other Ambulatory Visit: Payer: Self-pay

## 2024-01-03 ENCOUNTER — Ambulatory Visit: Payer: Medicaid Other

## 2024-01-03 ENCOUNTER — Other Ambulatory Visit: Payer: Self-pay

## 2024-01-03 ENCOUNTER — Encounter: Payer: Self-pay | Admitting: Pharmacist

## 2024-01-03 DIAGNOSIS — R634 Abnormal weight loss: Secondary | ICD-10-CM

## 2024-01-03 DIAGNOSIS — R112 Nausea with vomiting, unspecified: Secondary | ICD-10-CM

## 2024-01-03 DIAGNOSIS — E538 Deficiency of other specified B group vitamins: Secondary | ICD-10-CM | POA: Diagnosis not present

## 2024-01-03 DIAGNOSIS — Z7689 Persons encountering health services in other specified circumstances: Secondary | ICD-10-CM | POA: Diagnosis not present

## 2024-01-03 MED ORDER — CYANOCOBALAMIN 1000 MCG/ML IJ SOLN
1000.0000 ug | Freq: Once | INTRAMUSCULAR | Status: AC
  Administered 2024-01-03: 1000 ug via INTRAMUSCULAR

## 2024-01-05 ENCOUNTER — Other Ambulatory Visit (HOSPITAL_COMMUNITY): Payer: Self-pay

## 2024-01-05 ENCOUNTER — Ambulatory Visit (INDEPENDENT_AMBULATORY_CARE_PROVIDER_SITE_OTHER): Payer: Self-pay | Admitting: Professional Counselor

## 2024-01-05 DIAGNOSIS — F3131 Bipolar disorder, current episode depressed, mild: Secondary | ICD-10-CM

## 2024-01-05 DIAGNOSIS — Z7689 Persons encountering health services in other specified circumstances: Secondary | ICD-10-CM | POA: Diagnosis not present

## 2024-01-05 DIAGNOSIS — F50022 Anorexia nervosa, binge eating/purging type, severe: Secondary | ICD-10-CM
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
## 2024-01-08 ENCOUNTER — Other Ambulatory Visit (HOSPITAL_COMMUNITY): Payer: Self-pay

## 2024-01-08 ENCOUNTER — Other Ambulatory Visit: Payer: Self-pay

## 2024-01-10 ENCOUNTER — Ambulatory Visit: Payer: Medicaid Other

## 2024-01-12 ENCOUNTER — Ambulatory Visit: Admitting: Physician Assistant

## 2024-01-12 MED ORDER — CYANOCOBALAMIN 1000 MCG/ML IJ SOLN: 1000.0000 ug | Freq: Once | INTRAMUSCULAR | Status: AC

## 2024-01-12 NOTE — Progress Notes (Signed)
 Pt left before administering injection.  Syringe disposed

## 2024-01-16 ENCOUNTER — Encounter: Payer: Self-pay | Admitting: Gastroenterology

## 2024-01-16 ENCOUNTER — Ambulatory Visit (INDEPENDENT_AMBULATORY_CARE_PROVIDER_SITE_OTHER): Payer: Medicaid Other | Admitting: Gastroenterology

## 2024-01-16 VITALS — BP 129/82 | HR 102 | Temp 97.4°F | Ht 68.0 in | Wt 155.2 lb

## 2024-01-16 DIAGNOSIS — K3184 Gastroparesis: Secondary | ICD-10-CM

## 2024-01-16 DIAGNOSIS — Z7689 Persons encountering health services in other specified circumstances: Secondary | ICD-10-CM | POA: Diagnosis not present

## 2024-01-16 DIAGNOSIS — Z8639 Personal history of other endocrine, nutritional and metabolic disease: Secondary | ICD-10-CM | POA: Diagnosis not present

## 2024-01-16 MED ORDER — METOCLOPRAMIDE HCL 5 MG PO TABS: 5.0000 mg | ORAL_TABLET | Freq: Three times a day (TID) | ORAL | 1 refills | Status: DC

## 2024-01-16 NOTE — Progress Notes (Signed)
 Troy Repress, MD [address]  [address]  [place], [place] [address]  Main: [phone]  Fax: [phone]    Gastroenterology Consultation  Referring Provider:     Erasmo Downer, MD Primary Care Physician:  Erasmo Downer, MD Primary Gastroenterologist:  Dr. Arlyss [name] Reason for Consultation: weight loss, epigastric pain, constipation, nausea and vomiting        HPI:   Troy [name] is a 53 y.o. male referred by Dr. Beryle Flock, Troy Schlein, MD  for consultation & management of unexplained weight loss.  Patient reports that since April, he has been losing weight gradually associated with epigastric pain, limiting his p.o. intake.  He lost about 30 pounds within last 4 months.  He denies any abdominal distention, bloating.  He does report some loose stools.  Patient has history of sleeve gastrectomy, found to have erosive esophagitis based on upper endoscopy in 05/30/2019.  At that time he was on Prilosec 40 mg p.o. twice daily for 3 months, decreased to 40 mg once a day for 1 month and stopped after confirmation of healing of erosive esophagitis.  Patient denies any new medications.  He is currently being treated for HIV, currently in remission  Follow-up visit 01/16/2024 Troy [name] is here for follow-up of intermittent flareup of episodes of intractable nausea, vomiting, epigastric discomfort, early satiety which has led to significant weight loss.  He also reports irregular bowel movements although stools are formed and not hard, has bowel movement about once a week.  He is taking over-the-counter stool softeners which do not seem to help much.  He is diagnosed with gastroparesis for which I started him on metoclopramide.  He was lost to follow-up since the diagnosis.  He does state that he had underestimated the diagnosis of gastroparesis but now he realizes given the severity of symptoms.  He regained some of the weight back, currently not going through flareup in the past  few weeks.  He continues to take omeprazole 40 mg twice daily   NSAIDs: None  Antiplts/Anticoagulants/Anti thrombotics: None  GI Procedures:  Upper endoscopy 11/23/2022 - Normal duodenal bulb and second portion of the duodenum. Biopsied. - A sleeve gastrectomy was found, characterized by healthy appearing mucosa. - Normal gastric body, incisura and antrum. Biopsied. - Medium- sized hiatal hernia. - Normal gastroesophageal junction and esophagus. DIAGNOSIS:  A. DUODENUM; COLD BIOPSY:  - ENTERIC MUCOSA WITH PRESERVED VILLOUS ARCHITECTURE AND NO SIGNIFICANT  HISTOPATHOLOGIC CHANGE.  - NEGATIVE FOR FEATURES OF CELIAC, DYSPLASIA, AND MALIGNANCY.   B. STOMACH; COLD BIOPSY:  - GASTRIC ANTRAL AND OXYNTIC MUCOSA WITH NO SIGNIFICANT HISTOPATHOLOGIC  CHANGE.  - NEGATIVE FOR H. PYLORI, DYSPLASIA, AND MALIGNANCY    Colonoscopy 03/22/2023 - Preparation of the colon was poor. - Stool in the rectum, in the recto- sigmoid colon and in the sigmoid colon. - No specimens collected.  EGD 09/12/2019 - Normal duodenal bulb and second portion of the duodenum. - Normal stomach. - Salmon-colored mucosa. Biopsied. - Normal esophagus. DIAGNOSIS:  A. GASTROESOPHAGEAL JUNCTION; COLD BIOPSY:  - SQUAMOUS MUCOSA WITH FEATURES OF REFLUX ESOPHAGITIS.  - NO COLUMNAR MUCOSA IDENTIFIED.  - NO INCREASE IN INTRAEPITHELIAL EOSINOPHILS (LESS THAN 2 PER HPF).  - NEGATIVE FOR DYSPLASIA AND MALIGNANCY.    EGD and colonoscopy 05/30/2019 - LA Grade C reflux esophagitis. - Normal stomach. Biopsied. - Normal duodenal bulb and second portion of the duodenum. Biopsied.  - Preparation of the colon was fair. - Patent end-to-side ileo-colonic anastomosis, characterized  by healthy appearing mucosa. - The examined portion of the ileum was normal. Biopsied. - One 6 mm polyp in the descending colon, removed with a cold snare. Resected and retrieved. - Normal mucosa in the entire examined colon. Biopsied. - The distal rectum and  anal verge are normal on retroflexion view. DIAGNOSIS:  A.  DUODENUM; COLD BIOPSY:  - UNREMARKABLE DUODENAL MUCOSA.  - NEGATIVE FOR FEATURES OF CELIAC DISEASE.  - NEGATIVE FOR DYSPLASIA AND MALIGNANCY.   B.  STOMACH; COLD BIOPSY:  - GASTRIC ANTRAL MUCOSA WITH MILD REACTIVE CHANGES.  - NEGATIVE FOR ACTIVE INFLAMMATION AND H. PYLORI.  - NEGATIVE FOR INTESTINAL METAPLASIA, DYSPLASIA, AND MALIGNANCY.   C.  TERMINAL ILEUM; COLD BIOPSY:  - UNREMARKABLE ENTERIC MUCOSA.  - NEGATIVE FOR INFLAMMATION, DYSPLASIA, AND MALIGNANCY.   D.  COLON, RANDOM; COLD BIOPSY:  - COLONIC MUCOSA WITH FOCAL MINIMAL ACTIVE INFLAMMATION.  - SEE COMMENT.  - NEGATIVE FOR DYSPLASIA AND MALIGNANCY.   DIAGNOSIS:  A. GASTROESOPHAGEAL JUNCTION; COLD BIOPSY:  - SQUAMOUS MUCOSA WITH FEATURES OF REFLUX ESOPHAGITIS.  - NO COLUMNAR MUCOSA IDENTIFIED.  - NO INCREASE IN INTRAEPITHELIAL EOSINOPHILS (LESS THAN 2 PER HPF).  - NEGATIVE FOR DYSPLASIA AND MALIGNANCY  Past Medical History:  Diagnosis Date   Alcohol use disorder 12/11/2023   Asthma    Bipolar 1 disorder (HCC)    BPH (benign prostatic hyperplasia) 12/11/2023   Diabetes mellitus without complication (HCC)    controlled by weight loss   GERD (gastroesophageal reflux disease)    History of CVA with residual deficit    HIV infection (HCC)    Hyperlipidemia 08/22/2022   Migraine headache    2-3 X/month   Nephrolithiasis    PTSD (post-traumatic stress disorder) 12/11/2023   Wears dentures    full upper and lower    Past Surgical History:  Procedure Laterality Date   COLONOSCOPY     COLONOSCOPY WITH PROPOFOL N/A 05/30/2019   Procedure: COLONOSCOPY WITH PROPOFOL;  Surgeon: Toney Reil, MD;  Location: [facility] ENDOSCOPY;  Service: Gastroenterology;  Laterality: N/A;   COLONOSCOPY WITH PROPOFOL N/A 03/22/2023   Procedure: COLONOSCOPY WITH PROPOFOL;  Surgeon: Toney Reil, MD;  Location: [facility] ENDOSCOPY;  Service: Gastroenterology;  Laterality: N/A;    ELBOW SURGERY Left    fracture   ESOPHAGOGASTRODUODENOSCOPY (EGD) WITH PROPOFOL N/A 05/30/2019   Procedure: ESOPHAGOGASTRODUODENOSCOPY (EGD) WITH PROPOFOL;  Surgeon: Toney Reil, MD;  Location: [facility] ENDOSCOPY;  Service: Gastroenterology;  Laterality: N/A;   ESOPHAGOGASTRODUODENOSCOPY (EGD) WITH PROPOFOL N/A 09/12/2019   Procedure: ESOPHAGOGASTRODUODENOSCOPY (EGD) WITH PROPOFOL;  Surgeon: Toney Reil, MD;  Location: [facility] ENDOSCOPY;  Service: Gastroenterology;  Laterality: N/A;   ESOPHAGOGASTRODUODENOSCOPY (EGD) WITH PROPOFOL N/A 11/23/2022   Procedure: ESOPHAGOGASTRODUODENOSCOPY (EGD) WITH PROPOFOL;  Surgeon: Toney Reil, MD;  Location: [facility] ENDOSCOPY;  Service: Gastroenterology;  Laterality: N/A;   FACIAL COSMETIC SURGERY     GASTRIC BYPASS     HERNIA REPAIR     KIDNEY STONE SURGERY     renal artery repair       Current Outpatient Medications:    albuterol (VENTOLIN HFA) 108 (90 Base) MCG/ACT inhaler, Inhale 2 puffs into the lungs every 6 (six) hours as needed for wheezing or shortness of breath., Disp: 8 g, Rfl: 2   ARIPiprazole (ABILIFY) 15 MG tablet, Take 1 tablet (15 mg total) by mouth daily., Disp: 90 tablet, Rfl: 0   atorvastatin (LIPITOR) 20 MG tablet, Take 1 tablet (20 mg total) by mouth daily., Disp: 30 tablet, Rfl: 11  beclomethasone (QVAR) 80 MCG/ACT inhaler, Inhale 1 puff into the lungs 2 (two) times daily., Disp: 1 each, Rfl: 2   bictegravir-emtricitabine-tenofovir AF (BIKTARVY) 50-200-25 MG TABS tablet, Take 1 tablet by mouth daily., Disp: 30 tablet, Rfl: 11   cetirizine (ZYRTEC) 10 MG tablet, Take 1 tablet (10 mg total) by mouth daily., Disp: 90 tablet, Rfl: 3   eszopiclone 3 MG TABS, Take 1 tablet (3 mg total) by mouth at bedtime as needed. Take immediately before bedtime, Disp: 30 tablet, Rfl: 1   fluticasone (FLONASE) 50 MCG/ACT nasal spray, Place 2 sprays into both nostrils daily., Disp: 16 g, Rfl: 6   montelukast (SINGULAIR) 10 MG tablet, TAKE 1  TABLET BY MOUTH EVERY DAY, Disp: 90 tablet, Rfl: 3   mupirocin ointment (BACTROBAN) 2 %, Apply 1 Application topically 2 (two) times daily. For 10 days, Disp: 22 g, Rfl: 0   naproxen (NAPROSYN) 375 MG tablet, TAKE 1 TABLET (375 MG TOTAL) BY MOUTH 2 (TWO) TIMES DAILY WITH A MEAL., Disp: 60 tablet, Rfl: 2   omeprazole (PRILOSEC) 40 MG capsule, Take 1 capsule (40 mg total) by mouth in the morning and at bedtime., Disp: 60 capsule, Rfl: 0   ondansetron (ZOFRAN-ODT) 4 MG disintegrating tablet, Take 1 tablet (4 mg total) by mouth every 8 (eight) hours as needed., Disp: 30 tablet, Rfl: 0   potassium citrate (UROCIT-K) 10 MEQ (1080 MG) SR tablet, Take 1 tablet (10 mEq total) by mouth 3 (three) times daily with meals., Disp: 90 tablet, Rfl: 11   sertraline (ZOLOFT) 100 MG tablet, Take 1.5 tablets (150 mg total) by mouth at bedtime., Disp: 135 tablet, Rfl: 0   SUMAtriptan (IMITREX) 100 MG tablet, TAKE ONE TABLET BY MOUTH AT ONSET OF THE HEADACHE. MAY REPEAT IN TWO HOURS, Disp: 10 tablet, Rfl: 3   tadalafil (CIALIS) 5 MG tablet, Take 1 tablet (5 mg total) by mouth daily., Disp: 30 tablet, Rfl: 11   tamsulosin (FLOMAX) 0.4 MG CAPS capsule, Take 2 capsules (0.8 mg total) by mouth daily., Disp: 60 capsule, Rfl: 0   verapamil (CALAN-SR) 120 MG CR tablet, Take 1 tablet (120 mg total) by mouth at bedtime., Disp: 30 tablet, Rfl: 2   metoCLOPramide (REGLAN) 5 MG tablet, Take 1 tablet (5 mg total) by mouth 4 (four) times daily -  before meals and at bedtime., Disp: 120 tablet, Rfl: 1  Current Facility-Administered Medications:    cyanocobalamin (VITAMIN B12) injection 1,000 mcg, 1,000 mcg, Intramuscular, Once, Bacigalupo, Troy Schlein, MD   Family History  Problem Relation Age of Onset   Bipolar disorder Mother    COPD Mother    Diabetes Mother    Hypertension Mother    Hyperlipidemia Mother    Healthy Father    Cancer Father        skin   Stroke Brother    Heart disease Brother    Seizures Brother    Thyroid  disease Brother    Diabetes Maternal Grandfather    Hyperlipidemia Maternal Grandfather    Cancer Maternal Grandmother        breast   Hyperlipidemia Maternal Grandmother    Hypertension Maternal Grandmother    Heart disease Paternal Grandfather    Hypertension Paternal Grandfather      Social History   Tobacco Use   Smoking status: Every Day    Types: E-cigarettes   Smokeless tobacco: Never   Tobacco comments:    Currently only vapes  Vaping Use   Vaping status: Every Day  Substances: Nicotine, Flavoring   Devices: Mod  Substance Use Topics   Alcohol use: Not Currently    Comment: 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, active in [place]   Drug use: Not Currently    Comment: previously used, no IV drugs, 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    Allergies as of 01/16/2024 - Review Complete 01/16/2024  Allergen Reaction Noted   Niacin Anaphylaxis 08/10/2011   Orange oil Anaphylaxis 06/29/2016   Zolpidem  12/16/2015    Review of Systems:    All systems reviewed and negative except where noted in HPI.   Physical Exam:  BP 129/82 (BP Location: Left Arm, Patient Position: Sitting, Cuff Size: Normal)   Pulse (!) 102   Temp (!) 97.4 F (36.3 C) (Oral)   Ht 5\' 8"  (1.727 m)   Wt 155 lb 4 oz (70.4 kg)   BMI 23.61 kg/m  No LMP for male patient.  General:   Alert,  Well-developed, well-nourished, pleasant and cooperative in NAD Head:  Normocephalic and atraumatic. Eyes:  Sclera clear, no icterus.   Conjunctiva pink. Ears:  Normal auditory acuity. Nose:  No deformity, discharge, or lesions. Mouth:  No deformity or lesions,oropharynx pink & moist. Neck:  Supple; no masses or thyromegaly. Lungs:  Respirations even and unlabored.  Clear throughout to auscultation.   No wheezes, crackles, or rhonchi. No acute distress. Heart:  Regular rate and rhythm; no murmurs, clicks, rubs, or gallops. Abdomen:  Normal bowel sounds. Soft, non-tender and non-distended without masses, hepatosplenomegaly or hernias noted.  No guarding or  rebound tenderness.   Rectal: Not performed Msk:  Symmetrical without gross deformities. Good, equal movement & strength bilaterally. Pulses:  Normal pulses noted. Extremities:  No clubbing or edema.  No cyanosis. Neurologic:  Alert and oriented x3;  grossly normal neurologically. Skin:  Intact without significant lesions or rashes. No jaundice. Psych:  Alert and cooperative. Normal mood and affect.  Imaging Studies: No abdominal imaging  Assessment and Plan:   Marquet Faircloth is a 53 y.o. male with history of HIV in remission on ART, history of erosive esophagitis, history of gastric sleeve, history of ileocolonic anastomosis, is seen in consultation for chronic symptoms of epigastric discomfort, early satiety, episodes of intractable nausea and vomiting, intermittent weight loss Upper endoscopy was unremarkable, negative for H. 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  Discussed about side effects including tardive dyskinesia and stop the medication right away if he notices any involuntary neck movements Continue omeprazole 40 mg twice daily Trial of Trulance 1 tablet daily, samples provided History of diabetes in the past, check HbA1c  Follow up in 3 to 4 months   Troy Repress, MD

## 2024-01-16 NOTE — Patient Instructions (Signed)
 Gave samples of Trulance Take 1 tablet daily let us know how it works and we can call you in a prescription for you.

## 2024-01-17 ENCOUNTER — Encounter: Payer: Self-pay | Admitting: Gastroenterology

## 2024-01-17 LAB — HEMOGLOBIN A1C
Est. average glucose Bld gHb Est-mCnc: 94 mg/dL
Hgb A1c MFr Bld: 4.9 % (ref 4.8–5.6)

## 2024-01-23 ENCOUNTER — Telehealth: Payer: Self-pay

## 2024-01-23 NOTE — Telephone Encounter (Signed)
 Called [facility] and they cancel the medication for Metoclopramide

## 2024-01-23 NOTE — Telephone Encounter (Signed)
 Troy Reil, MD  Denman George, CMA  Troy [name]  Please check with patient if he is taking Abilify.  If yes, he should not be taking metoclopramide that I just prescribed last week. Please call the pharmacy and discontinue the medication and advise patient not to take metoclopramide to avoid risk of unwanted neurologic symptoms  RV  Called patient and patient states he is still taking the Abilify and he states he has been taking both medications. Informed him he need to stop the Metoclopramide. He states he will do this

## 2024-01-24 ENCOUNTER — Ambulatory Visit (INDEPENDENT_AMBULATORY_CARE_PROVIDER_SITE_OTHER): Payer: Medicaid Other | Admitting: Professional Counselor

## 2024-01-24 ENCOUNTER — Ambulatory Visit (INDEPENDENT_AMBULATORY_CARE_PROVIDER_SITE_OTHER)

## 2024-01-24 DIAGNOSIS — F3132 Bipolar disorder, current episode depressed, moderate: Secondary | ICD-10-CM | POA: Diagnosis not present

## 2024-01-24 DIAGNOSIS — E538 Deficiency of other specified B group vitamins: Secondary | ICD-10-CM

## 2024-01-24 DIAGNOSIS — Z7689 Persons encountering health services in other specified circumstances: Secondary | ICD-10-CM | POA: Diagnosis not present

## 2024-01-24 MED ORDER — CYANOCOBALAMIN 1000 MCG/ML IJ SOLN
1000.0000 ug | Freq: Once | INTRAMUSCULAR | Status: AC
Start: 2024-01-24 — End: 2024-01-24
  Administered 2024-01-24: 1000 ug via INTRAMUSCULAR

## 2024-01-24 NOTE — Progress Notes (Unsigned)
  THERAPIST PROGRESS NOTE  Session Time: 10:05 AM - 10:55 AM  Participation Level: Active  Behavioral Response: Casual, Alert, Depressed  Type of Therapy: Individual Therapy  Treatment Goals addressed: Active BH Bipolar disorder LTG: "I want to raise the value of myself because I don't think I value enough. I want to raise my standards in men, but that comes back to my value because that's why I date bad boys."              Dates:  Start:  10/19/23    Expected End:  10/17/24      STG: Troy [name] "Troy [name]" will identify cognitive patterns and beliefs that contribute to symptoms and work towards restructuring those beliefs over the next 12 weeks.     STG: "I want to work on the PTSD." Troy [name] will reduce the impact of trauma as evidenced by processing the event, identifying stuck points, and restructuring unhealthy thinking patterns around it over the next 12 weeks.                Intervention: Troy [name] "Troy [name]" will identify his self-destructive behaviors (such as promiscuity, substance abuse, and aggression) and implement behavior change techniques over the next 12 weeks.   ProgressTowards Goals: Progressing  Interventions: CBT, Motivational Interviewing, and Supportive  Summary: Troy [name] is a 53 y.o. male who presents with a history of bipolar disorder and PTSD. He appeared somber but oriented x5. He reported his health is declining. He continues to sleep for days. He is not ready to tell his doctor about his purging. Troy [name] is still engaging with the gentleman that he's never physically met and keeps asking him for money. He was receptive to changing his wording around this ("He keeps taking my money. - He keeps asking and I keep giving him money.) Troy [name] reported if he doesn't come see him Friday like he planned, then he will block him. He engaged in drawing exercise to identify things within and outside of his control. Troy [name] provided feedback on his goal progress and areas for continued  improvement. He expressed thoughts of wanting to go to hospital for racing thoughts/crying. He denied SI/HI/AVH. He was receptive to additional coping mechanisms.   Therapist Response: Conducted session with Troy [name]. Began session with check-in/update since previous session. Utilized empathetic and reflective listening. Engaged Troy [name] in writing exercise - donut/circles of control to help him identify things within and outside of his control. Assisted with restructuring thinking patterns to increase empowerment. Reviewed treatment plan with input from Troy [name] on current strengths, needs, and progress towards goals. Completed verbal risk assessment. Provided additional coping skills to reduce racing thoughts and emotional disturbance (5-4-3-2-1, STOP, and TIP).  Scheduled additional appointment and concluded session.   Suicidal/Homicidal: No  Plan: Return again in 3 weeks.  Diagnosis: Bipolar affective disorder, currently depressed, moderate (HCC)  Collaboration of Care: Medication Management AEB chart review  Patient/Guardian was advised Release of Information must be obtained prior to any record release in order to collaborate their care with an outside provider. Patient/Guardian was advised if they have not already done so to contact the registration department to sign all necessary forms in order for [place] to release information regarding their care.   Consent: Patient/Guardian gives verbal consent for treatment and assignment of benefits for services provided during this visit. Patient/Guardian expressed understanding and agreed to proceed.   Troy [name], Troy [name] 01/24/2024

## 2024-01-26 ENCOUNTER — Encounter: Payer: Self-pay | Admitting: Family Medicine

## 2024-01-26 NOTE — Progress Notes (Signed)
 Patient here for B12 injection only.  I did not examine the patient.  I did review his medical history, medications, and allergies.  CMA gave injection. Patient tolerated well.  Erasmo Downer, MD, MPH [facility] 01/26/2024 8:01 AM

## 2024-01-31 ENCOUNTER — Ambulatory Visit

## 2024-01-31 DIAGNOSIS — E538 Deficiency of other specified B group vitamins: Secondary | ICD-10-CM | POA: Diagnosis not present

## 2024-01-31 DIAGNOSIS — Z7689 Persons encountering health services in other specified circumstances: Secondary | ICD-10-CM | POA: Diagnosis not present

## 2024-01-31 MED ORDER — CYANOCOBALAMIN 1000 MCG/ML IJ SOLN
1000.0000 ug | Freq: Once | INTRAMUSCULAR | Status: AC
  Administered 2024-01-31: 1000 ug via INTRAMUSCULAR

## 2024-01-31 NOTE — Progress Notes (Signed)
 B12 injection given

## 2024-02-03 DIAGNOSIS — Z419 Encounter for procedure for purposes other than remedying health state, unspecified: Secondary | ICD-10-CM | POA: Diagnosis not present
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
## 2024-02-07 ENCOUNTER — Ambulatory Visit (INDEPENDENT_AMBULATORY_CARE_PROVIDER_SITE_OTHER)

## 2024-02-07 DIAGNOSIS — Z7689 Persons encountering health services in other specified circumstances: Secondary | ICD-10-CM | POA: Diagnosis not present

## 2024-02-07 DIAGNOSIS — E538 Deficiency of other specified B group vitamins: Secondary | ICD-10-CM | POA: Diagnosis not present

## 2024-02-07 MED ORDER — CYANOCOBALAMIN 1000 MCG/ML IJ SOLN
1000.0000 ug | Freq: Once | INTRAMUSCULAR | Status: AC
  Administered 2024-02-07: 1000 ug via INTRAMUSCULAR

## 2024-02-07 NOTE — Progress Notes (Signed)
 Patient tolerated B12 injection well. He is to come back for monthly injection now. Will need labs done in 4 weeks prior to b12 injection.

## 2024-02-08 ENCOUNTER — Telehealth: Payer: Self-pay

## 2024-02-08 ENCOUNTER — Ambulatory Visit: Payer: Medicaid Other | Admitting: Psychiatry

## 2024-02-08 ENCOUNTER — Encounter: Payer: Self-pay | Admitting: Psychiatry

## 2024-02-08 VITALS — BP 112/80 | HR 61 | Temp 98.6°F | Ht 68.0 in | Wt 159.6 lb

## 2024-02-08 DIAGNOSIS — Z79899 Other long term (current) drug therapy: Secondary | ICD-10-CM | POA: Diagnosis not present

## 2024-02-08 DIAGNOSIS — G4700 Insomnia, unspecified: Secondary | ICD-10-CM | POA: Diagnosis not present

## 2024-02-08 DIAGNOSIS — E611 Iron deficiency: Secondary | ICD-10-CM | POA: Diagnosis not present

## 2024-02-08 DIAGNOSIS — F1220 Cannabis dependence, uncomplicated: Secondary | ICD-10-CM

## 2024-02-08 DIAGNOSIS — F509 Eating disorder, unspecified: Secondary | ICD-10-CM | POA: Diagnosis not present

## 2024-02-08 DIAGNOSIS — Z7689 Persons encountering health services in other specified circumstances: Secondary | ICD-10-CM | POA: Diagnosis not present

## 2024-02-08 DIAGNOSIS — F319 Bipolar disorder, unspecified: Secondary | ICD-10-CM | POA: Diagnosis not present

## 2024-02-08 DIAGNOSIS — F4310 Post-traumatic stress disorder, unspecified: Secondary | ICD-10-CM | POA: Diagnosis not present

## 2024-02-08 DIAGNOSIS — R413 Other amnesia: Secondary | ICD-10-CM | POA: Diagnosis not present

## 2024-02-08 MED ORDER — SERTRALINE HCL 100 MG PO TABS
150.0000 mg | ORAL_TABLET | Freq: Every day | ORAL | 1 refills | Status: AC
  Filled 2024-07-18: qty 135, 90d supply, fill #0

## 2024-02-08 MED ORDER — ARIPIPRAZOLE 15 MG PO TABS: 15.0000 mg | ORAL_TABLET | Freq: Every day | ORAL | 0 refills | Status: DC

## 2024-02-08 NOTE — Telephone Encounter (Signed)
 called hospital, she states that pt's have to call [phone] to set up an appt they also have to tell them what location that they want test done at. she states that it is scheduling for all of the [facility] locations.

## 2024-02-08 NOTE — Patient Instructions (Addendum)
 Continue Abilify 15 mg  Continue sertraline 150 mg at night  Continue Lunesta 3 mg at night as needed for sleep Obtain lab- (vitamin B 1, folic acid, ferritin) Obtain EKG  Obtain head CT Next appointment: 5/29 at 4 PM

## 2024-02-08 NOTE — Telephone Encounter (Signed)
 dr. Edda Goo sent a message asking me to check on a status of ct that was order a few months ago.

## 2024-02-08 NOTE — Telephone Encounter (Signed)
 notified pt what the hospital said. pt was driving so i called back and left the phone number on his voicemail so he can call and set up appt.

## 2024-02-12 ENCOUNTER — Other Ambulatory Visit: Payer: Self-pay | Admitting: Family Medicine

## 2024-02-12 ENCOUNTER — Telehealth: Payer: Self-pay

## 2024-02-12 NOTE — Telephone Encounter (Signed)
-----   Message from [facility] sent at 02/12/2024  1:51 PM EDT ----- Regarding: RE: mutual patient Hi Dr Edda Goo  Thanks for reaching out.  During flareup of gastroparesis the only option we have is trial of 2 weeks course of erythromycin 250 mg 3 times daily before each meal  Troy [name]  Please reach out to the patient, check with him if he is willing to try  RV ----- Message ----- From: Todd Fossa, MD Sent: 02/08/2024   5:30 PM EDT To: Selena Daily, MD Subject: mutual patient                                 Hi Dr. Baldomero Bone,   I'm reaching out regarding our mutual patient. He experiences purging behaviors, which appear to be partly secondary to abdominal discomfort associated with delayed gastric emptying. I noticed that metoclopramide  was discontinued, and I wanted to ask if you might have alternative options in mind for managing his gastric symptoms.  While I advised him to consider switching from antipsychotics to facilitate safer use of metoclopramide , he currently prefers to continue with Abilify . I would greatly appreciate any guidance or recommendations you might have.   Thanks,  Ivan Marion- psychiatry

## 2024-02-12 NOTE — Telephone Encounter (Signed)
 Called and left a message for call back

## 2024-02-13 ENCOUNTER — Other Ambulatory Visit: Payer: Self-pay

## 2024-02-13 ENCOUNTER — Other Ambulatory Visit (HOSPITAL_COMMUNITY): Payer: Self-pay

## 2024-02-13 NOTE — Telephone Encounter (Signed)
 Called and left a message for call back

## 2024-02-13 NOTE — Progress Notes (Signed)
 Spoke with patient on 4.22 did not document call information in error but medication was sent out same day.

## 2024-02-14 ENCOUNTER — Ambulatory Visit (INDEPENDENT_AMBULATORY_CARE_PROVIDER_SITE_OTHER): Payer: Medicaid Other | Admitting: Professional Counselor

## 2024-02-14 ENCOUNTER — Ambulatory Visit

## 2024-02-14 ENCOUNTER — Ambulatory Visit: Admission: RE | Admit: 2024-02-14 | Source: Ambulatory Visit

## 2024-02-14 DIAGNOSIS — Z7689 Persons encountering health services in other specified circumstances: Secondary | ICD-10-CM | POA: Diagnosis not present

## 2024-02-14 DIAGNOSIS — F509 Eating disorder, unspecified: Secondary | ICD-10-CM | POA: Diagnosis not present

## 2024-02-14 DIAGNOSIS — F3132 Bipolar disorder, current episode depressed, moderate: Secondary | ICD-10-CM | POA: Diagnosis not present

## 2024-02-14 NOTE — Telephone Encounter (Signed)
 Patient states he has had abdominal pain during and after he eats for a very long time. He has always thought it was his gastroparesis. He states that the pain has gotten worse and no better. He states that the pain is a constant intense pain in his upper abdominal area. He states he will vomit and throw up what he had just ate 2 to 3 times a week. He states that after the food is out of his stomach he is fine. He states when he does not vomit he does not really know how long it will last

## 2024-02-14 NOTE — Progress Notes (Unsigned)
  THERAPIST PROGRESS NOTE  Session Time: 9:00 AM - 9:43 AM  Participation Level: Active  Behavioral Response: Casual, Alert, Depressed  Type of Therapy: Individual Therapy  Treatment Goals addressed:  Active BH Bipolar disorder LTG: "I want to raise the value of myself because I don't think I value enough. I want to raise my standards in men, but that comes back to my value because that's why I date bad boys."              Dates:  Start:  10/19/23    Expected End:  10/17/24      STG: Erna He "[name]" will identify cognitive patterns and beliefs that contribute to symptoms and work towards restructuring those beliefs over the next 12 weeks.     STG: "I want to work on the PTSD." Ruth Cove will reduce the impact of trauma as evidenced by processing the event, identifying stuck points, and restructuring unhealthy thinking patterns around it over the next 12 weeks.                Intervention: Camdon "Ruth Cove" will identify his self-destructive behaviors (such as promiscuity, substance abuse, and aggression) and implement behavior change techniques over the next 12 weeks.   ProgressTowards Goals: Not Progressing  Interventions: CBT, Motivational Interviewing, and Supportive  Summary: Jaicob Dia is a 53 y.o. male who presents with a history of bipolar disorder, purging, and PTSD. He appeared depressed but oriented x5. 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 He reported ongoing communication with gentleman discussed in previous sessions and noted he has continued to give him money. [name] reported he has tried to block him but he continues to believe the next time will be different. [name] shared that he divulged his purging behaviors to Dr. Edda Goo and his gastroenterologist. He has shared that it is mostly in response to pain from his gastroparesis. He engaged in discussion about his behaviors and was receptive to re-framing if-then statement that is keeping him  stuck in these unhealthy patterns.   Therapist Response: Conducted session with [employer]. Began session with check-in/update since previous session. Utilized empathetic and reflective listening. Used open-ended questions to facilitate discussion and summarized [name]'s thoughts/feelings. Identified thinking patterns that might be keeping [name] stuck in negative behaviors. Explored ways to restructure thinking.  Scheduled additional appointment and concluded session.   Suicidal/Homicidal: No  Plan: Return again in 3 weeks.  Diagnosis: Bipolar affective disorder, currently depressed, moderate (HCC)  Self-induced purging  Collaboration of Care: Medication Management AEB chart review  Patient/Guardian was advised Release of Information must be obtained prior to any record release in order to collaborate their care with an outside provider. Patient/Guardian was advised if they have not already done so to contact the registration department to sign all necessary forms in order for us  to release information regarding their care.   Consent: Patient/Guardian gives verbal consent for treatment and assignment of benefits for services provided during this visit. Patient/Guardian expressed understanding and agreed to proceed.   Len Quale, [facility] 02/14/2024

## 2024-02-14 NOTE — Telephone Encounter (Signed)
 Recommend 2 weeks course of erythromycin 250 mg 3 times daily before each meal   RV

## 2024-02-15 ENCOUNTER — Encounter: Payer: Self-pay | Admitting: Family Medicine

## 2024-02-15 NOTE — Addendum Note (Signed)
 Addended by: Conny Del L on: 02/15/2024 10:49 AM   Modules accepted: Orders

## 2024-02-15 NOTE — Progress Notes (Deleted)
 Complete physical exam   Patient: Troy [name]   DOB: April 26, 1971   53 y.o. Male  MRN: [id number] Visit Date: 02/15/2024  Today's healthcare provider: Aden Agreste, MD   No chief complaint on file.  Subjective    Troy [name] is a 53 y.o. male who presents today for a complete physical exam.  He reports consuming a {diet types:17450} diet. {Exercise:19826} He generally feels {well/fairly well/poorly:18703}. He reports sleeping {well/fairly well/poorly:18703}. He {does/does not:200015} have additional problems to discuss today.    Discussed the use of AI scribe software for clinical note transcription with the patient, who gave verbal consent to proceed.  History of Present Illness            Last depression screening scores    12/12/2023    9:27 AM 11/30/2023    8:26 AM 11/16/2023    9:25 AM  PHQ 2/9 Scores  PHQ - 2 Score 0  1  PHQ- 9 Score   13     Information is confidential and restricted. Go to Review Flowsheets to unlock data.   Last fall risk screening    12/12/2023    9:27 AM  Fall Risk   Falls in the past year? 0  Risk for fall due to : No Fall Risks  Follow up Falls evaluation completed    {VISON DENTAL STD PSA (Optional):27386}  {History (Optional):23778}  Medications: Outpatient Medications Prior to Visit  Medication Sig   albuterol  (VENTOLIN  HFA) 108 (90 Base) MCG/ACT inhaler Inhale 2 puffs into the lungs every 6 (six) hours as needed for wheezing or shortness of breath.   ARIPiprazole  (ABILIFY ) 15 MG tablet Take 1 tablet (15 mg total) by mouth daily.   atorvastatin  (LIPITOR) 20 MG tablet Take 1 tablet (20 mg total) by mouth daily.   beclomethasone (QVAR) 80 MCG/ACT inhaler Inhale 1 puff into the lungs 2 (two) times daily.   bictegravir-emtricitabine -tenofovir  AF (BIKTARVY ) 50-200-25 MG TABS tablet Take 1 tablet by mouth daily.   cetirizine  (ZYRTEC ) 10 MG tablet Take 1 tablet (10 mg total) by mouth daily.   eszopiclone  3 MG TABS Take 1 tablet  (3 mg total) by mouth at bedtime as needed. Take immediately before bedtime   fluticasone  (FLONASE ) 50 MCG/ACT nasal spray Place 2 sprays into both nostrils daily.   metoCLOPramide  (REGLAN ) 5 MG tablet Take 1 tablet (5 mg total) by mouth 4 (four) times daily -  before meals and at bedtime.   montelukast  (SINGULAIR ) 10 MG tablet TAKE 1 TABLET BY MOUTH EVERY DAY   mupirocin  ointment (BACTROBAN ) 2 % Apply 1 Application topically 2 (two) times daily. For 10 days   naproxen  (NAPROSYN ) 375 MG tablet TAKE 1 TABLET (375 MG TOTAL) BY MOUTH 2 (TWO) TIMES DAILY WITH A MEAL.   omeprazole  (PRILOSEC) 40 MG capsule Take 1 capsule (40 mg total) by mouth in the morning and at bedtime.   ondansetron  (ZOFRAN -ODT) 4 MG disintegrating tablet Take 1 tablet (4 mg total) by mouth every 8 (eight) hours as needed.   potassium citrate  (UROCIT-K ) 10 MEQ (1080 MG) SR tablet Take 1 tablet (10 mEq total) by mouth 3 (three) times daily with meals.   sertraline  (ZOLOFT ) 100 MG tablet Take 1.5 tablets (150 mg total) by mouth at bedtime.   SUMAtriptan  (IMITREX ) 100 MG tablet TAKE ONE TABLET BY MOUTH AT ONSET OF THE HEADACHE. MAY REPEAT IN TWO HOURS   tadalafil  (CIALIS ) 5 MG tablet Take 1 tablet (5 mg total) by mouth daily.  tamsulosin  (FLOMAX ) 0.4 MG CAPS capsule Take 2 capsules (0.8 mg total) by mouth daily.   verapamil  (CALAN -SR) 120 MG CR tablet Take 1 tablet (120 mg total) by mouth at bedtime.   Facility-Administered Medications Prior to Visit  Medication Dose Route Frequency Provider   cyanocobalamin  (VITAMIN B12) injection 1,000 mcg  1,000 mcg Intramuscular Once Jevon Littlepage M, MD    Review of Systems {Insert previous labs (optional):23779} {See past labs  Heme  Chem  Endocrine  Serology  Results Review (optional):1}  Objective    There were no vitals taken for this visit. {Insert last BP/Wt (optional):23777}{See vitals history (optional):1}  Physical Exam   No results found for any visits on  02/15/24.  Assessment & Plan    Routine Health Maintenance and Physical Exam  Exercise Activities and Dietary recommendations  Goals      Achieve Undetectable HIV Viral Load < 20     Patient is on track. Patient will work on increased adherence      Comply with lab assessments     Patient is on track. Patient will adhere to provider and/or lab appointments      Increase CD4 count until steady state     Patient is on track. Patient will maintain adherence      Maintain optimal adherence to therapy     Patient is not on track and no change. Patient will work on increased adherence      Pharmacy Goals     Please follow up with your [facility]. When requesting your refills, please ask the pharmacy to process these through your [facility] Managed Medicaid coverage. Please let me know if you have any questions.  Thank you!    Arthur Lash, PharmD, [facility] [phone]         Immunization History  Administered Date(s) Administered   Hepatitis A, Adult 12/29/2021, 08/10/2022   Hepb-cpg 12/29/2021, 02/09/2022   Influenza, High Dose Seasonal PF 06/28/2018, 07/10/2019, 10/05/2020   Influenza, Seasonal, Injecte, Preservative Fre 09/14/2023   Influenza,inj,Quad PF,6+ Mos 06/28/2018, 07/10/2019, 10/05/2020, 08/10/2022   Influenza-Unspecified 08/07/2014, 05/28/2015, 06/28/2018   PNEUMOCOCCAL CONJUGATE-20 02/09/2022   Pfizer(Comirnaty)Fall Seasonal Vaccine 12 years and older 08/10/2022, 09/14/2023   Pneumococcal Conjugate-13 09/02/2013, 10/24/2013   Pneumococcal Polysaccharide-23 08/26/2014   Tdap 05/20/2015, 02/02/2018    Health Maintenance  Topic Date Due   Zoster Vaccines- Shingrix (1 of 2) Never done   COVID-19 Vaccine (6 - Pfizer risk 2024-25 season) 03/13/2024   INFLUENZA VACCINE  05/24/2024   DTaP/Tdap/Td (3 - Td or Tdap) 02/03/2028   Colonoscopy  03/21/2033   Pneumococcal Vaccine 26-24 Years old  Completed   Hepatitis C Screening   Completed   HIV Screening  Completed   HPV VACCINES  Aged Out   Meningococcal B Vaccine  Aged Out    Discussed health benefits of physical activity, and encouraged him to engage in regular exercise appropriate for his age and condition.  Problem List Items Addressed This Visit   None   Assessment and Plan               No follow-ups on file.     Aden Agreste, MD  [facility] [phone] (phone) [phone] (fax)  [facility]

## 2024-02-15 NOTE — Telephone Encounter (Signed)
 Please review the adverse effect with the Erythromycin and Verapamil . There is also a adverse effect with the Erythromycin and Atorvastatin .

## 2024-02-19 ENCOUNTER — Other Ambulatory Visit: Payer: Self-pay

## 2024-02-19 MED ORDER — ERYTHROMYCIN BASE 250 MG PO TABS: 250.0000 mg | ORAL_TABLET | Freq: Three times a day (TID) | ORAL | 0 refills | Status: AC

## 2024-02-19 NOTE — Addendum Note (Signed)
 Addended by: Conny Del L on: 02/19/2024 07:59 AM   Modules accepted: Orders

## 2024-02-19 NOTE — Telephone Encounter (Signed)
 Sent medication to the pharmacy per dr. Baldomero Bone request

## 2024-02-21 ENCOUNTER — Other Ambulatory Visit: Payer: Self-pay

## 2024-02-21 DIAGNOSIS — E538 Deficiency of other specified B group vitamins: Secondary | ICD-10-CM

## 2024-02-21 MED ORDER — CYANOCOBALAMIN 1000 MCG/ML IJ SOLN: 1000.0000 ug | INTRAMUSCULAR | Status: AC

## 2024-02-22 ENCOUNTER — Inpatient Hospital Stay: Payer: Medicaid Other | Attending: Oncology | Admitting: Licensed Clinical Social Worker

## 2024-02-22 ENCOUNTER — Other Ambulatory Visit: Payer: Self-pay | Admitting: Licensed Clinical Social Worker

## 2024-02-22 ENCOUNTER — Inpatient Hospital Stay: Payer: Medicaid Other

## 2024-02-22 ENCOUNTER — Encounter: Payer: Self-pay | Admitting: Licensed Clinical Social Worker

## 2024-02-22 DIAGNOSIS — Z8042 Family history of malignant neoplasm of prostate: Secondary | ICD-10-CM

## 2024-02-22 DIAGNOSIS — Z1379 Encounter for other screening for genetic and chromosomal anomalies: Secondary | ICD-10-CM | POA: Diagnosis not present

## 2024-02-22 DIAGNOSIS — Z803 Family history of malignant neoplasm of breast: Secondary | ICD-10-CM | POA: Diagnosis not present

## 2024-02-22 DIAGNOSIS — Z7689 Persons encountering health services in other specified circumstances: Secondary | ICD-10-CM | POA: Diagnosis not present

## 2024-02-22 LAB — GENETIC SCREENING ORDER

## 2024-02-22 NOTE — Progress Notes (Signed)
 REFERRING PROVIDER: Kathreen Pare, PA-C [address] [place],  [place] [address]  PRIMARY PROVIDER:  Mazie Speed, MD  PRIMARY REASON FOR VISIT:  1. Family history of prostate cancer   2. Family history of breast cancer      HISTORY OF PRESENT ILLNESS:   Mr. Turrentine, a 53 y.o. male, was seen for a [place] cancer genetics consultation at the request of Dr. Melbourne Spitz due to a family history of prostate cancer.  Mr. Stupar presents to clinic today to discuss the possibility of a hereditary predisposition to cancer, genetic testing, and to further clarify his future cancer risks, as well as potential cancer risks for family members.   CANCER HISTORY:  Mr. Wittman is a 53 y.o. male with no personal history of cancer.    Past Medical History:  Diagnosis Date   Alcohol use disorder 12/11/2023   Asthma    Bipolar 1 disorder (HCC)    BPH (benign prostatic hyperplasia) 12/11/2023   Diabetes mellitus without complication (HCC)    controlled by weight loss   GERD (gastroesophageal reflux disease)    History of CVA with residual deficit    HIV infection (HCC)    Hyperlipidemia 08/22/2022   Migraine headache    2-3 X/month   Nephrolithiasis    PTSD (post-traumatic stress disorder) 12/11/2023   Wears dentures    full upper and lower    Past Surgical History:  Procedure Laterality Date   COLONOSCOPY     COLONOSCOPY WITH PROPOFOL  N/A 05/30/2019   Procedure: COLONOSCOPY WITH PROPOFOL ;  Surgeon: Selena Daily, MD;  Location: [facility] ENDOSCOPY;  Service: Gastroenterology;  Laterality: N/A;   COLONOSCOPY WITH PROPOFOL  N/A 03/22/2023   Procedure: COLONOSCOPY WITH PROPOFOL ;  Surgeon: Selena Daily, MD;  Location: [facility] ENDOSCOPY;  Service: Gastroenterology;  Laterality: N/A;   ELBOW SURGERY Left    fracture   ESOPHAGOGASTRODUODENOSCOPY (EGD) WITH PROPOFOL  N/A 05/30/2019   Procedure: ESOPHAGOGASTRODUODENOSCOPY (EGD) WITH PROPOFOL ;  Surgeon: Selena Daily, MD;   Location: [facility] ENDOSCOPY;  Service: Gastroenterology;  Laterality: N/A;   ESOPHAGOGASTRODUODENOSCOPY (EGD) WITH PROPOFOL  N/A 09/12/2019   Procedure: ESOPHAGOGASTRODUODENOSCOPY (EGD) WITH PROPOFOL ;  Surgeon: Selena Daily, MD;  Location: [facility] ENDOSCOPY;  Service: Gastroenterology;  Laterality: N/A;   ESOPHAGOGASTRODUODENOSCOPY (EGD) WITH PROPOFOL  N/A 11/23/2022   Procedure: ESOPHAGOGASTRODUODENOSCOPY (EGD) WITH PROPOFOL ;  Surgeon: Selena Daily, MD;  Location: [facility] ENDOSCOPY;  Service: Gastroenterology;  Laterality: N/A;   FACIAL COSMETIC SURGERY     GASTRIC BYPASS     HERNIA REPAIR     KIDNEY STONE SURGERY     renal artery repair      FAMILY HISTORY:  We obtained a detailed, 4-generation family history.  Significant diagnoses are listed below: Family History  Problem Relation Age of Onset   Bipolar disorder Mother    COPD Mother    Diabetes Mother    Hypertension Mother    Hyperlipidemia Mother    Cervical cancer Mother    Healthy Father    Skin cancer Father        on face   Stroke Brother    Heart disease Brother    Seizures Brother    Thyroid  disease Brother    Leukemia Paternal Aunt    Hyperlipidemia Maternal Grandmother    Hypertension Maternal Grandmother    Diabetes Maternal Grandfather    Hyperlipidemia Maternal Grandfather    Prostate cancer Maternal Grandfather        metastatic d. 77s   Heart disease Paternal Grandfather  Hypertension Paternal Grandfather    Prostate cancer Other        two mat great uncles, met, d. 72s   Breast cancer Other     Mr. Huckleby has 1 sister, 54. No cancer history.  Mr. Deery mother is living at 69 and has history of cervical cancer. Patient has 3 maternal uncles, no cancers but one passed at 71 and another at 25. Patient's maternal grandfather passed of metastatic prostate cancer in his 32s. Two of his brothers also passed of metastatic prostate cancer in their 74s. Another brother of patient's maternal grandfather  passed of lung cancer, and a sister passed of breast cancer.   Mr. Cotte father has had skin cancer removed from his face. Patient's paternal uncle currently has leukemia. No other known cancers on this side of the family.  Mr. Pons is unaware of previous family history of genetic testing for hereditary cancer risks. There is no reported Ashkenazi Jewish ancestry. There is no known consanguinity.    GENETIC COUNSELING ASSESSMENT: Mr. Sanchezgarcia is a 53 y.o. male with a family history of prostate and breast cancer which is somewhat suggestive of a hereditary cancer syndrome and predisposition to cancer. We, therefore, discussed and recommended the following at today's visit.   DISCUSSION: We discussed that approximately 10% of prostate cancer is hereditary. Most cases of hereditary prostate cancer are associated with BRCA1/2 genes, although there are other genes associated with hereditary cancer as well. Cancers and risks are gene specific. We discussed that testing is beneficial for several reasons including knowing about cancer risks, identifying potential screening and risk-reduction options that may be appropriate, and to understand if other family members could be at risk for cancer and allow them to undergo genetic testing.   We reviewed the characteristics, features and inheritance patterns of hereditary cancer syndromes. We also discussed genetic testing, including the appropriate family members to test, the process of testing, insurance coverage and turn-around-time for results. We discussed the implications of a negative, positive and/or variant of uncertain significant result. We recommended Mr. Zawada pursue genetic testing for the Common Hereditary Cancers +RNA gene panel.   Based on Mr. Shirazi family history of cancer, he meets medical criteria for genetic testing. Though Mr. Nish is not personally affected, there are no affected family members that are willing/able to undergo hereditary cancer  testing.  Therefore, Mr. Distasio the most informative family member available. Despite that he meets criteria, he may still have an out of pocket cost.   PLAN: After considering the risks, benefits, and limitations, Mr. Treece provided informed consent to pursue genetic testing and the blood sample was sent to [facility] for analysis of the Common Hereditary Cancers+RNA panel. Results should be available within approximately 2-3 weeks' time, at which point they will be disclosed by telephone to Mr. Molzahn, as will any additional recommendations warranted by these results. Mr. Sahm will receive a summary of his genetic counseling visit and a copy of his results once available. This information will also be available in Epic.   Mr. Vroom questions were answered to his satisfaction today. Our contact information was provided should additional questions or concerns arise. Thank you for the referral and allowing us  to share in the care of your patient.   Valri Gee, MS, [facility] Genetic Counselor [email or web address] Phone: [phone]  50 minutes were spent on the date of the encounter in service to the patient including preparation, face-to-face consultation, documentation and care coordination. Dr. Nelson Bandy was available for discussion  regarding this case.   _______________________________________________________________________ For Office Staff:  Number of people involved in session: 1 Was an Intern/ student involved with case: no

## 2024-02-26 ENCOUNTER — Other Ambulatory Visit: Payer: Self-pay | Admitting: Physician Assistant

## 2024-02-26 DIAGNOSIS — N411 Chronic prostatitis: Secondary | ICD-10-CM

## 2024-02-28 ENCOUNTER — Ambulatory Visit: Admission: RE | Admit: 2024-02-28 | Source: Ambulatory Visit

## 2024-02-29 ENCOUNTER — Ambulatory Visit: Payer: Self-pay | Admitting: Licensed Clinical Social Worker

## 2024-02-29 ENCOUNTER — Encounter: Payer: Self-pay | Admitting: Licensed Clinical Social Worker

## 2024-02-29 ENCOUNTER — Telehealth: Payer: Self-pay | Admitting: Licensed Clinical Social Worker

## 2024-02-29 DIAGNOSIS — Z1379 Encounter for other screening for genetic and chromosomal anomalies: Secondary | ICD-10-CM | POA: Insufficient documentation

## 2024-02-29 NOTE — Progress Notes (Signed)
 HPI:   Mr. Troy [name] was previously seen in the [facility] due to a family history of cancer and concerns regarding a hereditary predisposition to cancer. Please refer to our prior cancer genetics clinic note for more information regarding our discussion, assessment and recommendations, at the time. Mr. Troy [name] recent genetic test results were disclosed to him, as were recommendations warranted by these results. These results and recommendations are discussed in more detail below.  CANCER HISTORY:  Oncology History   No history exists.    FAMILY HISTORY:  We obtained a detailed, 4-generation family history.  Significant diagnoses are listed below: Family History  Problem Relation Age of Onset   Bipolar disorder Mother    COPD Mother    Diabetes Mother    Hypertension Mother    Hyperlipidemia Mother    Cervical cancer Mother    Healthy Father    Skin cancer Father        on face   Stroke Brother    Heart disease Brother    Seizures Brother    Thyroid  disease Brother    Leukemia Paternal Aunt    Hyperlipidemia Maternal Grandmother    Hypertension Maternal Grandmother    Diabetes Maternal Grandfather    Hyperlipidemia Maternal Grandfather    Prostate cancer Maternal Grandfather        metastatic d. 61s   Heart disease Paternal Grandfather    Hypertension Paternal Grandfather    Prostate cancer Other        two mat great uncles, met, d. 80s   Breast cancer Other     Mr. Troy [name] has 1 sister, 70. No cancer history.   Mr. Troy [name] mother is living at 90 and has history of cervical cancer. Patient has 3 maternal uncles, no cancers but one passed at 47 and another at 18. Patient's maternal grandfather passed of metastatic prostate cancer in his 30s. Two of his brothers also passed of metastatic prostate cancer in their 76s. Another brother of patient's maternal grandfather passed of lung cancer, and a sister passed of breast cancer.    Mr. Troy [name] father has had skin  cancer removed from his face. Patient's paternal uncle currently has leukemia. No other known cancers on this side of the family.   Mr. Troy [name] is unaware of previous family history of genetic testing for hereditary cancer risks. There is no reported Ashkenazi Jewish ancestry. There is no known consanguinity.     GENETIC TEST RESULTS:  The [facility] Common Hereditary Cancers +RNA Panel found no pathogenic mutations.   The Common Hereditary Cancers Panel + RNA offered by [facility] includes sequencing and/or deletion duplication testing of the following 48 genes: APC*, ATM*, AXIN2, BAP1, BARD1, BMPR1A, BRCA1, BRCA2, BRIP1, CDH1, CDK4, CDKN2A (p14ARF), CDKN2A (p16INK4a), CHEK2, CTNNA1, DICER1*, EPCAM*, FH*, GREM1*, HOXB13, KIT, MBD4, MEN1*, MLH1*, MSH2*, MSH3*, MSH6*, MUTYH, NF1*, NTHL1, PALB2, PDGFRA, PMS2*, POLD1*, POLE, PTEN*, RAD51C, RAD51D, SDHA*, SDHB, SDHC*, SDHD, SMAD4, SMARCA4, STK11, TP53, TSC1*, TSC2, VHL.   The test report has been scanned into EPIC and is located under the Molecular Pathology section of the Results Review tab.  A portion of the result report is included below for reference. Genetic testing reported out on 02/28/2024.      Even though a pathogenic variant was not identified, possible explanations for the cancer in the family may include: There may be no hereditary risk for cancer in the family. The cancers in Mr. [name] and/or his family may be sporadic/familial or due to other  genetic and environmental factors. There may be a gene mutation in one of these genes that current testing methods cannot detect but that chance is small. There could be another gene that has not yet been discovered, or that we have not yet tested, that is responsible for the cancer diagnoses in the family.  It is also possible there is a hereditary cause for the cancer in the family that Mr. [name] did not inherit.  Therefore, it is important to remain in touch with cancer genetics in the future so that we  can continue to offer Mr. Troy [name] the most up to date genetic testing.   ADDITIONAL GENETIC TESTING:  We discussed with Mr. Troy [name] that his genetic testing was fairly extensive.  If there are additional relevant genes identified to increase cancer risk that can be analyzed in the future, we would be happy to discuss and coordinate this testing at that time.    CANCER SCREENING RECOMMENDATIONS:  Mr. Troy [name] test result is considered negative (normal).  This means that we have not identified a hereditary cause for his family history of cancer at this time.   An individual's cancer risk and medical management are not determined by genetic test results alone. Overall cancer risk assessment incorporates additional factors, including personal medical history, family history, and any available genetic information that may result in a personalized plan for cancer prevention and surveillance. Therefore, it is recommended he continue to follow the cancer management and screening guidelines provided by his primary healthcare provider.  RECOMMENDATIONS FOR FAMILY MEMBERS:   Individuals in this family might be at some increased risk of developing cancer, over the general population risk, due to the family history of cancer.  Individuals in the family should notify their providers of the family history of cancer. We recommend women in this family have a yearly mammogram beginning at age 36, or 58 years younger than the earliest onset of cancer, an annual clinical breast exam, and perform monthly breast self-exams.  Family members should have colonoscopies by at age 30, or earlier, as recommended by their providers. Other members of the family may still carry a pathogenic variant in one of these genes that Mr. Troy [name] did not inherit. Based on the family history, we recommend his maternal relatives have genetic counseling and testing. Mr. Troy [name] will let us  know if we can be of any assistance in coordinating genetic counseling  and/or testing for this family member.    FOLLOW-UP:  Lastly, we discussed with Mr. Troy [name] that cancer genetics is a rapidly advancing field and it is possible that new genetic tests will be appropriate for him and/or his family members in the future. We encouraged him to remain in contact with cancer genetics on an annual basis so we can update his personal and family histories and let him know of advances in cancer genetics that may benefit this family.   Our contact number was provided. Mr. Clemmens questions were answered to his satisfaction, and he knows he is welcome to call us  at anytime with additional questions or concerns.    Valri Gee, MS, [facility] Genetic Counselor [email or web address] Phone: [phone]

## 2024-02-29 NOTE — Telephone Encounter (Signed)
 I contacted Mr. Aughenbaugh via MyChart message to discuss his genetic testing results. No pathogenic variants were identified in the 48 genes analyzed. Detailed clinic note to follow.   The test report has been scanned into EPIC and is located under the Molecular Pathology section of the Results Review tab.  A portion of the result report is included below for reference.        Valri Gee, MS, [facility] Genetic Counselor [email or web address] Phone: [phone]

## 2024-03-04 DIAGNOSIS — Z419 Encounter for procedure for purposes other than remedying health state, unspecified: Secondary | ICD-10-CM | POA: Diagnosis not present

## 2024-03-06 ENCOUNTER — Ambulatory Visit

## 2024-03-06 ENCOUNTER — Ambulatory Visit (INDEPENDENT_AMBULATORY_CARE_PROVIDER_SITE_OTHER): Admitting: Professional Counselor

## 2024-03-06 DIAGNOSIS — F4310 Post-traumatic stress disorder, unspecified: Secondary | ICD-10-CM | POA: Diagnosis not present

## 2024-03-06 DIAGNOSIS — F3132 Bipolar disorder, current episode depressed, moderate: Secondary | ICD-10-CM | POA: Diagnosis not present

## 2024-03-06 NOTE — Progress Notes (Signed)
  THERAPIST PROGRESS NOTE  Session Time: 9:08 AM - 9:48 AM   Participation Level: Active  Behavioral Response: Well Groomed, Alert, Depressed  Type of Therapy: Individual Therapy  Treatment Goals addressed: Active BH Bipolar disorder LTG: "I want to raise the value of myself because I don't think I value enough. I want to raise my standards in men, but that comes back to my value because that's why I date bad boys."              Dates:  Start:  10/19/23    Expected End:  10/17/24      STG: Troy [name] Troy [name] [name] "Troy [name] Troy [name] [name]" will identify cognitive patterns and beliefs that contribute to symptoms and work towards restructuring those beliefs over the next 12 weeks.     STG: "I want to work on the PTSD." Troy [name] Troy [name] [name] will reduce the impact of trauma as evidenced by processing the event, identifying stuck points, and restructuring unhealthy thinking patterns around it over the next 12 weeks.                Intervention: Troy [name] "Troy [name] Troy [name] [name]" will identify his self-destructive behaviors (such as promiscuity, substance abuse, and aggression) and implement behavior change techniques over the next 12 weeks.   ProgressTowards Goals: Not Progressing  Interventions: Motivational Interviewing and Supportive  Summary: Troy [name] Troy [name] [name] is a 53 y.o. male who presents with a history of bipolar disorder, purging, and PTSD. Troy [name] [name] appeared depressed but oriented x5. Troy [name] [name] stated Troy [name] [name] is still not working although Troy [name] [name] is getting more interviews. Troy [name] Troy [name] [name] continues to engage with the gentleman and has taken money from his roommate to give to this gentleman. Troy [name] Troy [name] [name] continues to express hope this gentleman will show up and pay him back all the money Troy [name] [name] has taken. Troy [name] [name] is aware of the cycle Troy [name] [name] is stuck in and agrees it's like an addiction. Troy [name] Troy [name] [name] expressed recent SI, but denied plan/intent. Troy [name] [name] engaged in developing a safety plan and took a copy for himself. Troy [name] [name] .   Therapist Response: Conducted session with Troy [name] Troy [name] [name]. Began session with check-in/update since previous  session. Utilized empathetic and reflective listening. Used gentle confrontation to highlight Troy [name] Troy [name] [name]'s patten of behavior and thinking patterns that contribute to this behavior. Inquired about what Troy [name] Troy [name] [name] is willing to do to make changes. Assisted with checking the facts to help Troy [name] Troy [name] [name] focus on reality vs fantasy. Completed verbal risk assessment and safety plan. Scheduled additional appointment and concluded session.   Suicidal/Homicidal: Yes, without intent/plan Safety Plan  Warning Signs Depression Sleeping a lot  Hopelessness Emptiness  Coping Skills  Breathing exercises Tasks  Listen to music Go for a walk  Social Support Sister   Professional Support [facility] [place] [phone]  Plan: Return again in 3 weeks.  Diagnosis: Bipolar affective disorder, currently depressed, moderate (HCC)  PTSD (post-traumatic stress disorder)  Collaboration of Care: Medication Management AEB chart review  Patient/Guardian was advised Release of Information must be obtained prior to any record release in order to collaborate their care with an outside provider. Patient/Guardian was advised if they have not already done so to contact the registration department to sign all necessary forms in order for us  to release information regarding their care.   Consent: Patient/Guardian gives verbal consent for treatment and assignment of benefits for services provided during this visit. Patient/Guardian expressed understanding and agreed to proceed.   Len Quale, [facility] 03/06/2024

## 2024-03-07 ENCOUNTER — Other Ambulatory Visit: Payer: Self-pay

## 2024-03-07 ENCOUNTER — Telehealth: Payer: Self-pay

## 2024-03-07 NOTE — Telephone Encounter (Signed)
 received notice that CT was denied by insurance.

## 2024-03-07 NOTE — Telephone Encounter (Signed)
 pt was notified that insurance would not cover the hospital doing the CT. he was advised to contact his insurance and find out what facility is covered by his insurance and to notify our office when he finds out. we will need the name address and phone #

## 2024-03-12 DIAGNOSIS — R112 Nausea with vomiting, unspecified: Secondary | ICD-10-CM | POA: Diagnosis not present

## 2024-03-12 DIAGNOSIS — K3184 Gastroparesis: Secondary | ICD-10-CM | POA: Diagnosis not present

## 2024-03-13 ENCOUNTER — Other Ambulatory Visit: Payer: Self-pay

## 2024-03-13 ENCOUNTER — Ambulatory Visit (INDEPENDENT_AMBULATORY_CARE_PROVIDER_SITE_OTHER)

## 2024-03-13 DIAGNOSIS — E538 Deficiency of other specified B group vitamins: Secondary | ICD-10-CM

## 2024-03-13 MED ORDER — CYANOCOBALAMIN 1000 MCG/ML IJ SOLN
1000.0000 ug | Freq: Once | INTRAMUSCULAR | Status: AC
  Administered 2024-03-13: 1000 ug via INTRAMUSCULAR

## 2024-03-13 NOTE — Progress Notes (Signed)
Patient presented for B12 injection.

## 2024-03-14 LAB — VITAMIN B12: Vitamin B-12: >2000 pg/mL — ABNORMAL HIGH (ref 232–1245)

## 2024-03-15 ENCOUNTER — Other Ambulatory Visit (HOSPITAL_COMMUNITY): Payer: Self-pay
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
## 2024-03-19 ENCOUNTER — Other Ambulatory Visit: Payer: Self-pay

## 2024-03-19 ENCOUNTER — Ambulatory Visit: Payer: Self-pay | Admitting: Family Medicine

## 2024-03-19 ENCOUNTER — Other Ambulatory Visit: Payer: Self-pay | Admitting: Gastroenterology

## 2024-03-19 DIAGNOSIS — R634 Abnormal weight loss: Secondary | ICD-10-CM

## 2024-03-19 DIAGNOSIS — R112 Nausea with vomiting, unspecified: Secondary | ICD-10-CM

## 2024-03-19 DIAGNOSIS — K449 Diaphragmatic hernia without obstruction or gangrene: Secondary | ICD-10-CM

## 2024-03-19 NOTE — Progress Notes (Signed)
 Specialty Pharmacy Ongoing Clinical Assessment Note  Troy [name] is a 53 y.o. male who is being followed by the specialty pharmacy service for RxSp HIV   Patient's specialty medication(s) reviewed today: Bictegravir-Emtricitab-Tenofov (Biktarvy )   Missed doses in the last 4 weeks: 3-4   Patient/Caregiver did not have any additional questions or concerns.   Therapeutic benefit summary: Patient is achieving benefit   Adverse events/side effects summary: No adverse events/side effects   Patient's therapy is appropriate to: Continue    Goals Addressed             This Visit's Progress    Achieve Undetectable HIV Viral Load < 20   On track    Patient is on track. Patient will work on increased adherence. Paient viral load <20 copies/mL as of 12/12/23      Comply with lab assessments   On track    Patient is on track. Patient will adhere to provider and/or lab appointments      Increase CD4 count until steady state   On track    Patient is on track. Patient will maintain adherence      Maintain optimal adherence to therapy   No change    Patient is not on track and no change. Patient will work on increased adherence         Follow up: 6 months  Advertising account planner

## 2024-03-19 NOTE — Progress Notes (Signed)
 Specialty Pharmacy Refill Coordination Note  Troy [name] is a 53 y.o. male contacted today regarding refills of specialty medication(s) Bictegravir-Emtricitab-Tenofov (Biktarvy )   Patient requested Delivery   Delivery date: 03/22/24   Verified address: [address]   [place] [address]   Medication will be filled on 03/21/24.

## 2024-03-20 ENCOUNTER — Other Ambulatory Visit: Payer: Self-pay

## 2024-03-20 MED ORDER — OMEPRAZOLE 40 MG PO CPDR
40.0000 mg | DELAYED_RELEASE_CAPSULE | Freq: Two times a day (BID) | ORAL | 5 refills | Status: DC
  Filled 2024-03-20 (×2): qty 60, 30d supply, fill #0
  Filled 2024-05-01: qty 60, 30d supply, fill #1
  Filled 2024-05-29: qty 60, 30d supply, fill #2
  Filled 2024-07-18: qty 60, 30d supply, fill #3
  Filled 2024-08-13: qty 60, 30d supply, fill #4
  Filled 2024-09-24: qty 60, 30d supply, fill #5

## 2024-03-21 ENCOUNTER — Encounter: Payer: Self-pay | Admitting: Psychiatry

## 2024-03-21 ENCOUNTER — Other Ambulatory Visit: Payer: Self-pay

## 2024-03-21 ENCOUNTER — Encounter: Admitting: Family Medicine

## 2024-03-21 ENCOUNTER — Other Ambulatory Visit: Payer: Self-pay | Admitting: Family Medicine

## 2024-03-21 ENCOUNTER — Ambulatory Visit: Admitting: Psychiatry

## 2024-03-21 DIAGNOSIS — F4310 Post-traumatic stress disorder, unspecified: Secondary | ICD-10-CM

## 2024-03-21 DIAGNOSIS — F3131 Bipolar disorder, current episode depressed, mild: Secondary | ICD-10-CM

## 2024-03-21 DIAGNOSIS — G4700 Insomnia, unspecified: Secondary | ICD-10-CM

## 2024-03-21 MED ORDER — ESZOPICLONE 3 MG PO TABS: 3.0000 mg | ORAL_TABLET | Freq: Every evening | ORAL | 1 refills | Status: AC | PRN

## 2024-03-21 NOTE — Patient Instructions (Signed)
 Continue Abilify  15 mg  Continue sertraline  150 mg at night  Continue Lunesta  3 mg at night as needed for sleep  Obtain lab- (vitamin B 1, folic acid, ferritin) Obtain EKG - please call [phone]  to make an appointment  Obtain head CT Next appointment: 6/27 at 9:30, video and 7/28 at 3 pm

## 2024-03-22 NOTE — Progress Notes (Signed)
 The ASCVD Risk score (Arnett DK, et al., 2019) failed to calculate for the following reasons:   Risk score cannot be calculated because patient has a medical history suggesting prior/existing ASCVD  Arlon Bergamo, BSN, RN

## 2024-03-26 ENCOUNTER — Ambulatory Visit: Admitting: Professional Counselor

## 2024-04-04 DIAGNOSIS — Z419 Encounter for procedure for purposes other than remedying health state, unspecified: Secondary | ICD-10-CM | POA: Diagnosis not present

## 2024-04-12 ENCOUNTER — Other Ambulatory Visit: Payer: Self-pay
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
## 2024-04-15 ENCOUNTER — Telehealth: Payer: Self-pay

## 2024-04-15 NOTE — Telephone Encounter (Signed)
 prior auth for the eszopiclone  3mg  was approved frin 04-15-24 to 10-12-24. ticket # [id number]

## 2024-04-16 ENCOUNTER — Ambulatory Visit: Admitting: Professional Counselor

## 2024-04-16 ENCOUNTER — Other Ambulatory Visit: Payer: Self-pay

## 2024-04-18 ENCOUNTER — Other Ambulatory Visit (HOSPITAL_COMMUNITY): Payer: Self-pay

## 2024-04-19 ENCOUNTER — Telehealth (INDEPENDENT_AMBULATORY_CARE_PROVIDER_SITE_OTHER): Admitting: Psychiatry

## 2024-04-19 ENCOUNTER — Encounter: Payer: Self-pay | Admitting: Psychiatry

## 2024-04-19 DIAGNOSIS — F4310 Post-traumatic stress disorder, unspecified: Secondary | ICD-10-CM

## 2024-04-19 DIAGNOSIS — G4700 Insomnia, unspecified: Secondary | ICD-10-CM | POA: Diagnosis not present

## 2024-04-19 DIAGNOSIS — F3131 Bipolar disorder, current episode depressed, mild: Secondary | ICD-10-CM | POA: Diagnosis not present

## 2024-04-19 MED ORDER — ARIPIPRAZOLE 15 MG PO TABS: 15.0000 mg | ORAL_TABLET | Freq: Every day | ORAL | 0 refills | Status: DC

## 2024-04-19 NOTE — Patient Instructions (Signed)
 Continue Abilify  15 mg  Continue sertraline  150 mg at night  Continue Lunesta  3 mg at night as needed for sleep  Obtain lab- (vitamin B 1, folic acid, ferritin) Obtain EKG  Obtain head CT Next appointment: 7/28 at 3 pm

## 2024-04-25 ENCOUNTER — Other Ambulatory Visit: Payer: Self-pay | Admitting: Family Medicine

## 2024-04-25 DIAGNOSIS — M25561 Pain in right knee: Secondary | ICD-10-CM

## 2024-04-29 ENCOUNTER — Telehealth: Payer: Self-pay

## 2024-04-29 NOTE — Telephone Encounter (Signed)
 Troy [name]

## 2024-04-29 NOTE — Telephone Encounter (Signed)
 Medication management - Message left for patient, after he called regarding problems for [facility] locating his remaining refill order for Lunesta , last e-scribed 03/21/24 +1 refill. Informed this nurse had sponken with Merridy, pharmacist who verified they had found the prescription on file and verified they were filling it now for patient as he did have one refill remaining. Message left for patient to call back if any issues getting refill as pt had reported being without 5 days.

## 2024-04-29 NOTE — Telephone Encounter (Signed)
 LOV 01/24/2024  Requested Prescriptions  Pending Prescriptions Disp Refills   naproxen  (NAPROSYN ) 375 MG tablet [Pharmacy Med Name: NAPROXEN  375 MG TABLET] 60 tablet 2    Sig: TAKE 1 TABLET (375 MG TOTAL) BY MOUTH 2 (TWO) TIMES DAILY WITH A MEAL.     Analgesics:  NSAIDS Failed - 04/29/2024 12:45 PM      Failed - Manual Review: Labs are only required if the patient has taken medication for more than 8 weeks.      Failed - Valid encounter within last 12 months    Recent Outpatient Visits           3 months ago B12 deficiency   [facility] [place], Jon HERO, MD              Passed - Cr in normal range and within 360 days    Creat  Date Value Ref Range Status  12/12/2023 1.22 0.70 - 1.30 mg/dL Final         Passed - HGB in normal range and within 360 days    Hemoglobin  Date Value Ref Range Status  12/12/2023 14.5 13.2 - 17.1 g/dL Final  98/76/7974 85.1 13.0 - 17.7 g/dL Final         Passed - PLT in normal range and within 360 days    Platelets  Date Value Ref Range Status  12/12/2023 142 140 - 400 Thousand/uL Final  11/16/2023 122 (L) 150 - 450 x10E3/uL Final         Passed - HCT in normal range and within 360 days    HCT  Date Value Ref Range Status  12/12/2023 43.9 38.5 - 50.0 % Final   Hematocrit  Date Value Ref Range Status  11/16/2023 43.5 37.5 - 51.0 % Final         Passed - eGFR is 30 or above and within 360 days    GFR calc Af Amer  Date Value Ref Range Status  01/22/2020 >60 >60 mL/min Final   GFR, Estimated  Date Value Ref Range Status  02/13/2023 >60 >60 mL/min Final    Comment:    (NOTE) Calculated using the CKD-EPI Creatinine Equation (2021)    eGFR  Date Value Ref Range Status  12/12/2023 72 > OR = 60 mL/min/1.18m2 Final  11/16/2023 98 >59 mL/min/1.73 Final         Passed - Patient is not pregnant

## 2024-05-01 ENCOUNTER — Other Ambulatory Visit (HOSPITAL_COMMUNITY): Payer: Self-pay

## 2024-05-02 ENCOUNTER — Other Ambulatory Visit (HOSPITAL_COMMUNITY): Payer: Self-pay

## 2024-05-02 ENCOUNTER — Other Ambulatory Visit: Payer: Self-pay

## 2024-05-02 NOTE — Progress Notes (Signed)
 Specialty Pharmacy Refill Coordination Note  Troy [name] is a 53 y.o. male contacted today regarding refills of specialty medication(s) Bictegravir-Emtricitab-Tenofov (Biktarvy )   Patient requested Delivery   Delivery date: 05/03/24   Verified address: [address]   [place] [address]   Medication will be filled on 05/02/24.

## 2024-05-04 DIAGNOSIS — Z419 Encounter for procedure for purposes other than remedying health state, unspecified: Secondary | ICD-10-CM | POA: Diagnosis not present

## 2024-05-06 ENCOUNTER — Encounter: Payer: Self-pay | Admitting: Professional Counselor

## 2024-05-13 ENCOUNTER — Encounter: Payer: Self-pay | Admitting: Family Medicine

## 2024-05-13 ENCOUNTER — Ambulatory Visit: Admitting: Family Medicine

## 2024-05-13 VITALS — BP 122/84 | HR 69 | Ht 68.0 in | Wt 166.0 lb

## 2024-05-13 DIAGNOSIS — K3184 Gastroparesis: Secondary | ICD-10-CM | POA: Diagnosis not present

## 2024-05-13 DIAGNOSIS — E538 Deficiency of other specified B group vitamins: Secondary | ICD-10-CM | POA: Diagnosis not present

## 2024-05-13 DIAGNOSIS — E785 Hyperlipidemia, unspecified: Secondary | ICD-10-CM

## 2024-05-13 DIAGNOSIS — Z23 Encounter for immunization: Secondary | ICD-10-CM | POA: Diagnosis not present

## 2024-05-13 DIAGNOSIS — B20 Human immunodeficiency virus [HIV] disease: Secondary | ICD-10-CM | POA: Diagnosis not present

## 2024-05-13 DIAGNOSIS — Z7689 Persons encountering health services in other specified circumstances: Secondary | ICD-10-CM | POA: Diagnosis not present

## 2024-05-13 DIAGNOSIS — Z79899 Other long term (current) drug therapy: Secondary | ICD-10-CM | POA: Diagnosis not present

## 2024-05-13 DIAGNOSIS — G8929 Other chronic pain: Secondary | ICD-10-CM

## 2024-05-13 DIAGNOSIS — M25561 Pain in right knee: Secondary | ICD-10-CM | POA: Diagnosis not present

## 2024-05-13 DIAGNOSIS — R413 Other amnesia: Secondary | ICD-10-CM

## 2024-05-13 DIAGNOSIS — Z0001 Encounter for general adult medical examination with abnormal findings: Secondary | ICD-10-CM

## 2024-05-13 DIAGNOSIS — Z0000 Encounter for general adult medical examination without abnormal findings: Secondary | ICD-10-CM

## 2024-05-13 MED ORDER — SUMATRIPTAN SUCCINATE 100 MG PO TABS
ORAL_TABLET | ORAL | 5 refills | Status: AC
  Filled 2024-07-18: qty 9, 30d supply, fill #0
  Filled 2024-08-13: qty 9, 30d supply, fill #1
  Filled 2024-09-24: qty 9, 30d supply, fill #2

## 2024-05-13 MED ORDER — METOCLOPRAMIDE HCL 5 MG PO TABS: 5.0000 mg | ORAL_TABLET | Freq: Three times a day (TID) | ORAL | 5 refills | Status: DC

## 2024-05-13 MED ORDER — CETIRIZINE HCL 10 MG PO TABS: 10.0000 mg | ORAL_TABLET | Freq: Every day | ORAL | 3 refills | Status: DC

## 2024-05-13 MED ORDER — VERAPAMIL HCL ER 120 MG PO TBCR: 120.0000 mg | EXTENDED_RELEASE_TABLET | Freq: Every day | ORAL | 1 refills | Status: DC

## 2024-05-13 MED ORDER — CYANOCOBALAMIN 1000 MCG/ML IJ SOLN
1000.0000 ug | Freq: Once | INTRAMUSCULAR | Status: AC
  Administered 2024-05-13: 1000 ug via INTRAMUSCULAR

## 2024-05-13 MED ORDER — MONTELUKAST SODIUM 10 MG PO TABS
10.0000 mg | ORAL_TABLET | Freq: Every day | ORAL | 3 refills | Status: AC
  Filled 2024-07-18 – 2024-08-13 (×2): qty 90, 90d supply, fill #0

## 2024-05-13 NOTE — Assessment & Plan Note (Signed)
 Chronic gastroparesis causing significant symptoms including vomiting and diarrhea. Weight fluctuations noted, with recent weight gain to 160 lbs after previous loss to 130 lbs. Metoclopramide  (Reglan ) has been effective in managing symptoms. - Refill metoclopramide  5 mg, to be taken before meals and at bedtime

## 2024-05-13 NOTE — Progress Notes (Signed)
 Complete physical exam   Patient: Troy [name]   DOB: July 06, 1971   53 y.o. Male  MRN: [id number] Visit Date: 05/13/2024  Today's healthcare provider: Jon Eva, MD   Chief Complaint  Patient presents with   Annual Exam    Last completed 05/05/22 Diet - Unhealthy, consuming 1/2 meal a day, mainly junk food Exercise - daily walking 2-6 miles a day Feeling - fairly well Sleeping - poorly Concerns - Weight    Subjective    Troy [name] is a 54 y.o. male who presents today for a complete physical exam.     Discussed the use of AI scribe software for clinical note transcription with the patient, who gave verbal consent to proceed.  History of Present Illness   Troy [name] is a 53 year old male with gastroparesis, Crohn's disease, and HIV who presents for a follow-up visit.  He experiences frequent vomiting and diarrhea due to gastroparesis, affecting his quality of life. He has difficulty maintaining weight, recently dropping to 130 pounds but regaining to 160 pounds. He struggles with eating and often relies on chips to maintain weight. Metoclopramide  5 mg before meals helps him eat more comfortably.  His HIV is well-managed with an undetectable viral load on Biktarvy , but he is inconsistent with medication due to a conflict with his Abilify  schedule. He is concerned about missing doses and potential health impacts.  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 He has not sought treatment for this.  He experiences knee pain, particularly in the medial aspect of the right knee, and uses naproxen  for relief. He is concerned about taking too many medications and has not tried topical treatments.  He has been living with a supportive roommate for almost a year, who helps with reminders.        Last depression screening scores    05/13/2024    1:45 PM 12/12/2023    9:27 AM 11/30/2023     8:26 AM  PHQ 2/9 Scores  PHQ - 2 Score 5 0   PHQ- 9 Score 18       Information is confidential and restricted. Go to Review Flowsheets to unlock data.   Last fall risk screening    05/13/2024    1:45 PM  Fall Risk   Falls in the past year? 0  Number falls in past yr: 0  Injury with Fall? 0  Risk for fall due to : No Fall Risks  Follow up Falls evaluation completed        Medications: Outpatient Medications Prior to Visit  Medication Sig   albuterol  (VENTOLIN  HFA) 108 (90 Base) MCG/ACT inhaler Inhale 2 puffs into the lungs every 6 (six) hours as needed for wheezing or shortness of breath.   [START ON 05/14/2024] ARIPiprazole  (ABILIFY ) 15 MG tablet Take 1 tablet (15 mg total) by mouth daily.   atorvastatin  (LIPITOR) 20 MG tablet Take 1 tablet (20 mg total) by mouth daily.   beclomethasone (QVAR) 80 MCG/ACT inhaler Inhale 1 puff into the lungs 2 (two) times daily.   bictegravir-emtricitabine -tenofovir  AF (BIKTARVY ) 50-200-25 MG TABS tablet Take 1 tablet by mouth daily.   eszopiclone  3 MG TABS Take 1 tablet (3 mg total) by mouth at bedtime as needed. Take immediately before bedtime   fluticasone  (FLONASE ) 50 MCG/ACT nasal spray Place 2 sprays into both nostrils daily.   mupirocin  ointment (BACTROBAN ) 2 % Apply 1 Application topically 2 (two)  times daily. For 10 days   naproxen  (NAPROSYN ) 375 MG tablet TAKE 1 TABLET (375 MG TOTAL) BY MOUTH 2 (TWO) TIMES DAILY WITH A MEAL.   omeprazole  (PRILOSEC) 40 MG capsule Take 1 capsule (40 mg total) by mouth in the morning and at bedtime.   ondansetron  (ZOFRAN -ODT) 4 MG disintegrating tablet Take 1 tablet (4 mg total) by mouth every 8 (eight) hours as needed.   potassium citrate  (UROCIT-K ) 10 MEQ (1080 MG) SR tablet Take 1 tablet (10 mEq total) by mouth 3 (three) times daily with meals.   sertraline  (ZOLOFT ) 100 MG tablet Take 1.5 tablets (150 mg total) by mouth at bedtime.   tadalafil  (CIALIS ) 5 MG tablet Take 1 tablet (5 mg total) by mouth  daily.   tamsulosin  (FLOMAX ) 0.4 MG CAPS capsule Take 1 capsule (0.4 mg total) by mouth daily. Go back to 1 tablet daily   [DISCONTINUED] cetirizine  (ZYRTEC ) 10 MG tablet Take 1 tablet (10 mg total) by mouth daily.   [DISCONTINUED] metoCLOPramide  (REGLAN ) 5 MG tablet Take 1 tablet (5 mg total) by mouth 4 (four) times daily -  before meals and at bedtime.   [DISCONTINUED] montelukast  (SINGULAIR ) 10 MG tablet TAKE 1 TABLET BY MOUTH EVERY DAY   [DISCONTINUED] SUMAtriptan  (IMITREX ) 100 MG tablet TAKE ONE TABLET BY MOUTH AT ONSET OF THE HEADACHE. MAY REPEAT IN TWO HOURS   [DISCONTINUED] verapamil  (CALAN -SR) 120 MG CR tablet TAKE 1 TABLET BY MOUTH AT BEDTIME.   Facility-Administered Medications Prior to Visit  Medication Dose Route Frequency Provider   cyanocobalamin  (VITAMIN B12) injection 1,000 mcg  1,000 mcg Intramuscular Once Lindsey Hommel M, MD   cyanocobalamin  (VITAMIN B12) injection 1,000 mcg  1,000 mcg Intramuscular Q30 days Myrla Jon HERO, MD    Review of Systems    Objective    BP 122/84 (BP Location: Left Arm, Patient Position: Sitting, Cuff Size: Normal)   Pulse 69   Ht 5' 8 (1.727 m)   Wt 166 lb (75.3 kg)   SpO2 99%   BMI 25.24 kg/m    Physical Exam Vitals reviewed.  Constitutional:      General: He is not in acute distress.    Appearance: Normal appearance. He is well-developed. He is not diaphoretic.  HENT:     Head: Normocephalic and atraumatic.     Right Ear: Tympanic membrane, ear canal and external ear normal.     Left Ear: Tympanic membrane, ear canal and external ear normal.     Nose: Nose normal.     Mouth/Throat:     Mouth: Mucous membranes are moist.     Pharynx: Oropharynx is clear. No oropharyngeal exudate.  Eyes:     General: No scleral icterus.    Conjunctiva/sclera: Conjunctivae normal.     Pupils: Pupils are equal, round, and reactive to light.  Neck:     Thyroid : No thyromegaly.  Cardiovascular:     Rate and Rhythm: Normal rate and  regular rhythm.     Pulses: Normal pulses.     Heart sounds: Normal heart sounds. No murmur heard. Pulmonary:     Effort: Pulmonary effort is normal. No respiratory distress.     Breath sounds: Normal breath sounds. No wheezing or rales.  Abdominal:     General: There is no distension.     Palpations: Abdomen is soft.     Tenderness: There is no abdominal tenderness.  Musculoskeletal:        General: No deformity.     Cervical back: Neck  supple.     Right lower leg: No edema.     Left lower leg: No edema.  Lymphadenopathy:     Cervical: No cervical adenopathy.  Skin:    General: Skin is warm and dry.     Findings: No rash.  Neurological:     Mental Status: He is alert and oriented to person, place, and time. Mental status is at baseline.     Gait: Gait normal.  Psychiatric:        Mood and Affect: Mood normal.        Behavior: Behavior normal.        Thought Content: Thought content normal.      No results found for any visits on 05/13/24.  Assessment & Plan    Routine Health Maintenance and Physical Exam  Exercise Activities and Dietary recommendations  Goals      Achieve Undetectable HIV Viral Load < 20     Patient is on track. Patient will work on increased adherence. Paient viral load <20 copies/mL as of 12/12/23      Comply with lab assessments     Patient is on track. Patient will adhere to provider and/or lab appointments      Increase CD4 count until steady state     Patient is on track. Patient will maintain adherence      Maintain optimal adherence to therapy     Patient is not on track and no change. Patient will work on increased adherence      Pharmacy Goals     Please follow up with your [facility]. When requesting your refills, please ask the pharmacy to process these through your [facility] Managed Medicaid coverage. Please let me know if you have any questions.  Thank you!    Sharyle Sia, PharmD, [facility] [phone]         Immunization History  Administered Date(s) Administered   Hepatitis A, Adult 12/29/2021, 08/10/2022   Hepb-cpg 12/29/2021, 02/09/2022   Influenza, High Dose Seasonal PF 06/28/2018, 07/10/2019, 10/05/2020   Influenza, Seasonal, Injecte, Preservative Fre 09/14/2023   Influenza,inj,Quad PF,6+ Mos 06/28/2018, 07/10/2019, 10/05/2020, 08/10/2022   Influenza-Unspecified 08/07/2014, 05/28/2015, 06/28/2018   PFIZER(Purple Top)SARS-COV-2 Vaccination 01/01/2020, 01/29/2020, 06/25/2020   PNEUMOCOCCAL CONJUGATE-20 02/09/2022   Pfizer(Comirnaty)Fall Seasonal Vaccine 12 years and older 08/10/2022, 09/14/2023   Pneumococcal Conjugate-13 09/02/2013, 10/24/2013   Pneumococcal Polysaccharide-23 08/26/2014   Tdap 05/20/2015, 02/02/2018    Health Maintenance  Topic Date Due   Zoster Vaccines- Shingrix  (1 of 2) Never done   COVID-19 Vaccine (6 - Pfizer risk 2024-25 season) 03/13/2024   INFLUENZA VACCINE  05/24/2024   DTaP/Tdap/Td (3 - Td or Tdap) 02/03/2028   Colonoscopy  03/21/2033   Pneumococcal Vaccine 14-49 Years old  Completed   Hepatitis B Vaccines  Completed   Hepatitis C Screening  Completed   HIV Screening  Completed   HPV VACCINES  Aged Out   Meningococcal B Vaccine  Aged Out    Discussed health benefits of physical activity, and encouraged him to engage in regular exercise appropriate for his age and condition.  Problem List Items Addressed This Visit       Digestive   Gastroparesis   Chronic gastroparesis causing significant symptoms including vomiting and diarrhea. Weight fluctuations noted, with recent weight gain to 160 lbs after previous loss to 130 lbs. Metoclopramide  (Reglan ) has been effective in managing symptoms. - Refill metoclopramide  5 mg, to be taken before meals and at bedtime  Relevant Medications   metoCLOPramide  (REGLAN ) 5 MG tablet     Other   Dyslipidemia   Relevant Orders   Lipid panel   Comprehensive metabolic panel  with GFR   HIV disease (HCC)   Relevant Orders   AMB Referral [facility]I Care Management   Chronic pain of right knee   Chronic right knee pain, primarily medial, with worsening symptoms. Previous x-ray indicated arthritis, particularly in the medial compartment. Possible meniscal involvement suggested by pain during specific maneuvers. - Refer to orthopedics for further evaluation - Recommend Voltaren gel for topical use up to four times daily - Advise use of ice therapy for pain management      Relevant Orders   Ambulatory referral to Orthopedic Surgery   Memory deficit   Relevant Orders   Ambulatory referral to Neurology   Other Visit Diagnoses       Encounter for annual physical exam    -  Primary     Long-term use of high-risk medication       Relevant Orders   Comprehensive metabolic panel with GFR   CBC w/Diff/Platelet     Immunization due       Relevant Orders   Varicella-zoster vaccine IM     B12 deficiency       Relevant Medications   cyanocobalamin  (VITAMIN B12) injection 1,000 mcg           Memory impairment, under evaluation 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 Possible medication-related or HIV-related cognitive effects considered. - Refer to neurology for further evaluation  Human immunodeficiency virus (HIV) infection, well controlled HIV infection is well controlled with an undetectable viral load. Currently on Biktarvy  regimen. Reports adherence issues due to medication timing conflicts with Abilify . - Coordinate with pharmacist to optimize medication timing for Biktarvy  and Abilify  - Continue regular follow-up with infectious disease specialist  Essential (primary) hypertension Hypertension is well controlled with current medication regimen.  Migraine Migraine management requires refill of sumatriptan , which has been effective. - Refill sumatriptan   Allergic rhinitis Allergic rhinitis managed with  montelukast  and Zyrtec . Declined Flonase  refill. - Refill montelukast  and Zyrtec   Vitamin B12 deficiency, on replacement therapy Vitamin B12 deficiency managed with regular injections. Due for B12 shot today. - Administer B12 injection  General Health Maintenance Routine health maintenance discussed, including vaccinations and screenings. Shingles vaccine recommended due to age and risk factors. Discussed potential side effects of shingles vaccine, including redness and irritation at the injection site, and transient flu-like symptoms. - Administer first dose of shingles vaccine today - Schedule second dose of shingles vaccine in 2-6 months - Order routine labs including kidney and liver function tests, blood counts, and cholesterol       Return in about 6 months (around 11/13/2024) for chronic disease f/u, shingrix  #2.     Jon Eva, MD  [facility] [phone] (phone) [phone] (fax)  [facility]

## 2024-05-13 NOTE — Assessment & Plan Note (Signed)
 Chronic right knee pain, primarily medial, with worsening symptoms. Previous x-ray indicated arthritis, particularly in the medial compartment. Possible meniscal involvement suggested by pain during specific maneuvers. - Refer to orthopedics for further evaluation - Recommend Voltaren gel for topical use up to four times daily - Advise use of ice therapy for pain management

## 2024-05-14 ENCOUNTER — Ambulatory Visit: Payer: Self-pay | Admitting: Family Medicine

## 2024-05-14 ENCOUNTER — Other Ambulatory Visit: Payer: Self-pay

## 2024-05-14 LAB — LIPID PANEL
Chol/HDL Ratio: 4.3 ratio (ref 0.0–5.0)
Cholesterol, Total: 185 mg/dL (ref 100–199)
HDL: 43 mg/dL (ref >39)
LDL Chol Calc (NIH): 112 mg/dL — ABNORMAL HIGH (ref 0–99)
Triglycerides: 168 mg/dL — ABNORMAL HIGH (ref 0–149)
VLDL Cholesterol Cal: 30 mg/dL (ref 5–40)

## 2024-05-14 LAB — COMPREHENSIVE METABOLIC PANEL WITH GFR
ALT: 22 IU/L (ref 0–44)
AST: 31 IU/L (ref 0–40)
Albumin: 4.5 g/dL (ref 4.1–5.1)
Alkaline Phosphatase: 67 IU/L (ref 44–121)
BUN/Creatinine Ratio: 9 (ref 9–20)
BUN: 12 mg/dL (ref 6–24)
Bilirubin Total: 0.9 mg/dL (ref 0.0–1.2)
CO2: 24 mmol/L (ref 20–29)
Calcium: 9.6 mg/dL (ref 8.7–10.2)
Chloride: 102 mmol/L (ref 96–106)
Creatinine, Ser: 1.31 mg/dL — ABNORMAL HIGH (ref 0.76–1.27)
Globulin, Total: 2.4 g/dL (ref 1.5–4.5)
Glucose: 71 mg/dL (ref 70–99)
Potassium: 4.4 mmol/L (ref 3.5–5.2)
Sodium: 139 mmol/L (ref 134–144)
Total Protein: 6.9 g/dL (ref 6.0–8.5)
eGFR: 66 mL/min/1.73 (ref >59)

## 2024-05-14 LAB — CBC WITH DIFFERENTIAL/PLATELET
Basophils Absolute: 0 x10E3/uL (ref 0.0–0.2)
Basos: 1 %
EOS (ABSOLUTE): 0.1 x10E3/uL (ref 0.0–0.4)
Eos: 1 %
Hematocrit: 44.7 % (ref 37.5–51.0)
Hemoglobin: 14.7 g/dL (ref 13.0–17.7)
Immature Grans (Abs): 0 x10E3/uL (ref 0.0–0.1)
Immature Granulocytes: 0 %
Lymphocytes Absolute: 2.1 x10E3/uL (ref 0.7–3.1)
Lymphs: 33 %
MCH: 29.7 pg (ref 26.6–33.0)
MCHC: 32.9 g/dL (ref 31.5–35.7)
MCV: 90 fL (ref 79–97)
Monocytes Absolute: 0.6 x10E3/uL (ref 0.1–0.9)
Monocytes: 9 %
Neutrophils Absolute: 3.6 x10E3/uL (ref 1.4–7.0)
Neutrophils: 56 %
Platelets: 185 x10E3/uL (ref 150–450)
RBC: 4.95 x10E6/uL (ref 4.14–5.80)
RDW: 13.1 % (ref 11.6–15.4)
WBC: 6.4 x10E3/uL (ref 3.4–10.8)

## 2024-05-14 MED ORDER — ATORVASTATIN CALCIUM 20 MG PO TABS
20.0000 mg | ORAL_TABLET | Freq: Every day | ORAL | 1 refills | Status: AC
  Filled 2024-07-18: qty 90, 90d supply, fill #0
  Filled 2024-08-13 – 2024-09-24 (×2): qty 90, 90d supply, fill #1

## 2024-05-16 ENCOUNTER — Telehealth: Payer: Self-pay

## 2024-05-16 DIAGNOSIS — Z7689 Persons encountering health services in other specified circumstances: Secondary | ICD-10-CM | POA: Diagnosis not present

## 2024-05-16 DIAGNOSIS — M2391 Unspecified internal derangement of right knee: Secondary | ICD-10-CM | POA: Diagnosis not present

## 2024-05-16 DIAGNOSIS — M25561 Pain in right knee: Secondary | ICD-10-CM | POA: Diagnosis not present
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
## 2024-05-16 NOTE — Progress Notes (Signed)
 Complex Care Management Note  Care Guide Note 05/16/2024 Name: Varnell Orvis MRN: [id number] DOB: 01/09/1971  Robb Sibal is a 53 y.o. year old male who sees Bacigalupo, Jon HERO, MD for primary care. I reached out to Neomia Buddle by phone today to offer complex care management services.  Mr. Kleinfelter was given information about Complex Care Management services today including:   The Complex Care Management services include support from the care team which includes your Nurse Care Manager, Clinical Social Worker, or Pharmacist.  The Complex Care Management team is here to help remove barriers to the health concerns and goals most important to you. Complex Care Management services are voluntary, and the patient may decline or stop services at any time by request to their care team member.   Complex Care Management Consent Status: Patient agreed to services and verbal consent obtained.   Follow up plan:  Telephone appointment with complex care management team member scheduled for:  05/23/24 at 11:00 a.m.   Encounter Outcome:  Patient Scheduled  Dreama [facility]  [facility], [facility] Health Care Management Assistant Direct Dial: [phone]  Fax: [phone]

## 2024-05-20 ENCOUNTER — Other Ambulatory Visit: Payer: Self-pay

## 2024-05-20 ENCOUNTER — Encounter: Payer: Self-pay | Admitting: Psychiatry

## 2024-05-20 ENCOUNTER — Ambulatory Visit (INDEPENDENT_AMBULATORY_CARE_PROVIDER_SITE_OTHER): Admitting: Psychiatry

## 2024-05-20 VITALS — BP 115/79 | HR 101 | Temp 98.7°F | Ht 68.0 in | Wt 166.2 lb

## 2024-05-20 DIAGNOSIS — Z79899 Other long term (current) drug therapy: Secondary | ICD-10-CM

## 2024-05-20 DIAGNOSIS — G4700 Insomnia, unspecified: Secondary | ICD-10-CM | POA: Diagnosis not present

## 2024-05-20 DIAGNOSIS — R413 Other amnesia: Secondary | ICD-10-CM | POA: Diagnosis not present

## 2024-05-20 DIAGNOSIS — F509 Eating disorder, unspecified: Secondary | ICD-10-CM | POA: Diagnosis not present

## 2024-05-20 DIAGNOSIS — F3131 Bipolar disorder, current episode depressed, mild: Secondary | ICD-10-CM | POA: Diagnosis not present

## 2024-05-20 DIAGNOSIS — F4310 Post-traumatic stress disorder, unspecified: Secondary | ICD-10-CM

## 2024-05-20 DIAGNOSIS — Z7689 Persons encountering health services in other specified circumstances: Secondary | ICD-10-CM | POA: Diagnosis not present

## 2024-05-20 NOTE — Patient Instructions (Signed)
 Continue Abilify  15 mg daily Continue sertraline  150 mg at night  Continue Lunesta  3 mg at night as needed for sleep  Obtain lab- (vitamin B 1, folic acid, ferritin) Obtain EKG - please call [phone] to make an appointment  Next appointment:  9/9 at 3 pm

## 2024-05-21 ENCOUNTER — Other Ambulatory Visit: Payer: Self-pay

## 2024-05-21 ENCOUNTER — Telehealth: Payer: Self-pay | Admitting: Psychiatry

## 2024-05-21 NOTE — Progress Notes (Signed)
 Clinical Intervention Note  Clinical Intervention Notes: Received message from patient's psychiatrist regarding Abilify  and Biktarvy . Provider stated that patient was told by pharmacy that the 2 medications should not be taken together and patient unable to stay adherent with taking Biktarvy  at night. Checked micromedex and there is no clinical implications or DDIs identified between Biktarvy  and Abilify . Messaged patient's psychiatrist back with this information.   Clinical Intervention Outcomes: Prevention of an adverse drug event; Improved therapy adherence   [facility] Specialty Pharmacist

## 2024-05-21 NOTE — Telephone Encounter (Signed)
 Please contact him. I've communicated with the pharmacist regarding your medications. She reviewed multiple resources and consulted with another pharmacist, and both confirmed that Abilify  and Biktarvy  can be safely taken together. He may take Biktarvy  at a time that is most convenient for him.

## 2024-05-21 NOTE — Telephone Encounter (Signed)
 Called patient left a voicemail to return call to office

## 2024-05-22 NOTE — Telephone Encounter (Signed)
 Second attempt to reach patient no answer left voicemail for patient to return call to office

## 2024-05-23 ENCOUNTER — Other Ambulatory Visit: Payer: Self-pay

## 2024-05-23 ENCOUNTER — Telehealth: Payer: Self-pay

## 2024-05-23 NOTE — Progress Notes (Signed)
   05/23/2024  Patient ID: Neomia Buddle, male   DOB: 1970-12-16, 53 y.o.   MRN: [id number]  Attempted to contact patient for medication management/review. Left HIPAA compliant message for patient to return my call at their convenience.   First attempt for patient outreach. Will follow up with patient.  Per chart review, there was concern about a possible DDI with concomitant use of Abilify  and Biktarvy  which patient was contributing to medication noncompliance. Per review of resources/references, there is no identified drug interaction.   Thank you for allowing pharmacy to be a part of this patient's care.  Dorcas Solian, PharmD Clinical Pharmacist Cell: [phone]

## 2024-05-24 ENCOUNTER — Other Ambulatory Visit (HOSPITAL_COMMUNITY): Payer: Self-pay

## 2024-05-25 ENCOUNTER — Other Ambulatory Visit (HOSPITAL_COMMUNITY): Payer: Self-pay

## 2024-05-26 ENCOUNTER — Other Ambulatory Visit: Payer: Self-pay | Admitting: Family Medicine

## 2024-05-26 ENCOUNTER — Other Ambulatory Visit: Payer: Self-pay | Admitting: Physician Assistant

## 2024-05-26 DIAGNOSIS — N200 Calculus of kidney: Secondary | ICD-10-CM

## 2024-05-27 ENCOUNTER — Telehealth: Payer: Self-pay

## 2024-05-27 ENCOUNTER — Other Ambulatory Visit: Payer: Self-pay

## 2024-05-27 NOTE — Progress Notes (Signed)
 Care Guide Pharmacy Note  05/27/2024 Name: Troy [name] MRN: [id number] DOB: 01/17/1971  Referred By: Myrla Jon HERO, MD Reason for referral: Complex Care Management (Initial outreach scheduled with PHARM D- Asajah)   Troy [name] is a 53 y.o. year old male who is a primary care patient of Bacigalupo, Angela M, MD.  Troy [name] was referred to the pharmacist for assistance related to: polypharmacy  Successful contact was made with the patient to discuss pharmacy services including being ready for the pharmacist to call at least 5 minutes before the scheduled appointment time and to have medication bottles and any blood pressure readings ready for review. The patient agreed to meet with the pharmacist via telephone visit on (date/time). 05/29/24 @ 11 AM.   Leot[facility]  [facility], [facility] Guide  Direct Dial: [phone]  Fax [phone]

## 2024-05-29 ENCOUNTER — Other Ambulatory Visit: Payer: Self-pay

## 2024-05-29 ENCOUNTER — Other Ambulatory Visit: Payer: Self-pay | Admitting: Pharmacy Technician

## 2024-05-29 NOTE — Progress Notes (Signed)
 Specialty Pharmacy Refill Coordination Note  Troy [name] is a 53 y.o. male contacted today regarding refills of specialty medication(s) Bictegravir-Emtricitab-Tenofov (Biktarvy )   Patient requested Delivery   Delivery date: 05/31/24   Verified address: [address] [place]   Medication will be filled on 05/30/24.

## 2024-05-29 NOTE — Progress Notes (Signed)
 06/03/2024 Name: Troy [name] MRN: [id number] DOB: Jun 13, 1971  Chief Complaint  Patient presents with   Medication Adherence   Medication Management    Troy [name] is a 53 y.o. year old male who presented for a telephone visit.   They were referred to the pharmacist by their PCP for assistance in managing medication adherence. Specifically the referral was with regard to needs assistance with timinig fo Biktarvy  with other medications and forgetting doses at bedtime.    Subjective:  Care Team: Primary Care Provider: Myrla Jon HERO, MD ; Next Scheduled Visit: N/A   Medication Access/Adherence  Current Pharmacy:  [facility] [address] [place] - [address] [place] [address] Phone: [phone] Fax: [phone]  [facility] - [facility] [address] [place] [address] Phone: [phone] Fax: [phone]   Patient reports affordability concerns with their medications: No  During medication review patient stated he has not took Qvar due to cost yet states he has not filled this medication since having Medicaid. Encouraged patient to refill medication if appropriate.  Patient reports access/transportation concerns to their pharmacy: Did not discuss today Patient reports adherence concerns with their medications:  Yes  He reports missing mostly miss nighttime doses   Medication Review:  Current adherence strategy: Pillbox  - Often miss night dose and currently uses pillbox  - He owns a smart phone. Dicussed using a pill reminder.   Patient reports Fair adherence to medications  Patient reports the following barriers to adherence: Cost for certain medications  During medication review, patient stated that he has not started Naproxen  due to bump in creatinine. He has not been taking Qvar due to cost.      Objective:  Lab Results  Component Value Date   HGBA1C 4.9 01/16/2024    Lab Results  Component  Value Date   CREATININE 1.31 (H) 05/13/2024   BUN 12 05/13/2024   NA 139 05/13/2024   K 4.4 05/13/2024   CL 102 05/13/2024   CO2 24 05/13/2024    Lab Results  Component Value Date   CHOL 185 05/13/2024   HDL 43 05/13/2024   LDLCALC 112 (H) 05/13/2024   TRIG 168 (H) 05/13/2024   CHOLHDL 4.3 05/13/2024    Medications Reviewed Today     Reviewed by Cleatus Dorcas SAUNDERS, RPH (Pharmacist) on 05/29/24 at 1121  Med List Status: <None>   Medication Order Taking? Sig Documenting Provider Last Dose Status Informant  albuterol  (VENTOLIN  HFA) 108 (90 Base) MCG/ACT inhaler [id number]  Inhale 2 puffs into the lungs every 6 (six) hours as needed for wheezing or shortness of breath.  Patient not taking: Reported on 05/29/2024   Rumball, Alison M, DO  Active   ARIPiprazole  (ABILIFY ) 15 MG tablet [id number] Yes Take 1 tablet (15 mg total) by mouth daily. Hisada, Reina, MD  Active   atorvastatin  (LIPITOR) 20 MG tablet [id number] Yes Take 1 tablet (20 mg total) by mouth daily. Bacigalupo, Angela M, MD  Active   beclomethasone (QVAR) 80 MCG/ACT inhaler [id number]  Inhale 1 puff into the lungs 2 (two) times daily.  Patient not taking: Reported on 05/29/2024   Rumball, Alison M, DO  Active   bictegravir-emtricitabine -tenofovir  AF (BIKTARVY ) 50-200-25 MG TABS tablet [id number] Yes Take 1 tablet by mouth daily. Fleeta Rothman, Jomarie SAILOR, MD  Active   cetirizine  (ZYRTEC ) 10 MG tablet [id number] Yes Take 1 tablet (10 mg total) by mouth daily. Myrla Jon HERO, MD  Active   cyanocobalamin  (VITAMIN B12) injection 1,000 mcg [id number]   Myrla Jon HERO, MD  Active   cyanocobalamin  (VITAMIN B12) injection 1,000 mcg [id number]   Myrla Jon HERO, MD  Active   eszopiclone  3 MG TABS [id number] Yes Take 1 tablet (3 mg total) by mouth at bedtime as needed. Take immediately before bedtime Hisada, Katheren, MD  Active   fluticasone  (FLONASE ) 50 MCG/ACT nasal spray [id number]  Place 2 sprays into both nostrils daily.  Patient not  taking: Reported on 05/29/2024   Bacigalupo, Angela M, MD  Active   metoCLOPramide  (REGLAN ) 5 MG tablet [id number] Yes Take 1 tablet (5 mg total) by mouth 4 (four) times daily -  before meals and at bedtime. Myrla Jon HERO, MD  Active   montelukast  (SINGULAIR ) 10 MG tablet [id number] Yes Take 1 tablet (10 mg total) by mouth daily. Bacigalupo, Angela M, MD  Active   mupirocin  ointment (BACTROBAN ) 2 % [id number]  Apply 1 Application topically 2 (two) times daily. For 10 days  Patient not taking: Reported on 05/29/2024   Donzella Lauraine SAILOR, DO  Active   naproxen  (NAPROSYN ) 375 MG tablet [id number]  TAKE 1 TABLET (375 MG TOTAL) BY MOUTH 2 (TWO) TIMES DAILY WITH A MEAL.  Patient not taking: Reported on 05/29/2024   Bacigalupo, Angela M, MD  Active   omeprazole  (PRILOSEC) 40 MG capsule [id number] Yes Take 1 capsule (40 mg total) by mouth in the morning and at bedtime. Unk Corinn Skiff, MD  Active   ondansetron  (ZOFRAN -ODT) 4 MG disintegrating tablet [id number] Yes Take 1 tablet (4 mg total) by mouth every 8 (eight) hours as needed. Vivienne Nest M, PA-C  Active   potassium citrate  (UROCIT-K ) 10 MEQ (1080 MG) SR tablet [id number] Yes TAKE 1 TABLET (10 MEQ TOTAL) BY MOUTH 3 (THREE) TIMES DAILY WITH MEALS. Vaillancourt, Samantha, PA-C  Active   sertraline  (ZOLOFT ) 100 MG tablet [id number] Yes Take 1.5 tablets (150 mg total) by mouth at bedtime. Hisada, Reina, MD  Active   SUMAtriptan  (IMITREX ) 100 MG tablet [id number] Yes TAKE ONE TABLET BY MOUTH AT ONSET OF THE HEADACHE. MAY REPEAT IN TWO HOURS Bacigalupo, Jon HERO, MD  Active   tadalafil  (CIALIS ) 5 MG tablet [id number] Yes Take 1 tablet (5 mg total) by mouth daily. Vaillancourt, Samantha, PA-C  Active   tamsulosin  (FLOMAX ) 0.4 MG CAPS capsule [id number] Yes Take 1 capsule (0.4 mg total) by mouth daily. Go back to 1 tablet daily Vaillancourt, Samantha, PA-C  Active   tiZANidine  (ZANAFLEX ) 4 MG tablet [id number]  TAKE 1 TABLET BY MOUTH 2 TIMES DAILY. Bacigalupo,  Angela M, MD  Active   verapamil  (CALAN -SR) 120 MG CR tablet [id number]  Take 1 tablet (120 mg total) by mouth at bedtime. Myrla Jon HERO, MD  Active               Assessment/Plan:   Medication Review   DDI: Biktarvy  interaction with NSAIDs causing renal toxicity and Abilify  Serum creatinine is already elevated--discussed continue holding Naproxen  for now. Patient is expected to be receiving Voltaren as an alternative. Advised patient that Tylenol  could be a safer option given normal LFTs and no known interaction with Biktarvy . Emphasized the importance of not exceeding the maximum daily dose.  Patient was informed that multiple drug interaction resources were reviewed, and no interaction was identified between Abilify  and Biktarvy .  Advised the patient to take most of his medications during the day (when possible) to help avoid missed doses, and to consider using  a pill reminder for adherence support.  An attempt was made to discuss adherence and conduct a full medication access assessment; however, the call was disconnected. The patient was contacted twice afterward, but follow-up was unsuccessful.    Follow Up Plan: 1-2 weeks   Dorcas Solian, PharmD Clinical Pharmacist Cell: [phone]

## 2024-06-04 DIAGNOSIS — Z419 Encounter for procedure for purposes other than remedying health state, unspecified: Secondary | ICD-10-CM | POA: Diagnosis not present

## 2024-06-07 ENCOUNTER — Telehealth: Payer: Self-pay

## 2024-06-07 ENCOUNTER — Other Ambulatory Visit: Payer: Self-pay

## 2024-06-07 NOTE — Progress Notes (Signed)
   06/07/2024  Patient ID: Neomia Buddle, male   DOB: 26-Apr-1971, 53 y.o.   MRN: [id number]  Attempted to contact patient for medication management/review. Left HIPAA compliant message for patient to return my call at their convenience.   Second attempt for patient outreach. Will follow up with patient.  Thank you for allowing pharmacy to be a part of this patient's care.  Dorcas Solian, PharmD Clinical Pharmacist Cell: [phone]

## 2024-06-12 ENCOUNTER — Ambulatory Visit

## 2024-06-13 ENCOUNTER — Telehealth: Payer: Self-pay

## 2024-06-13 NOTE — Telephone Encounter (Signed)
Please see the message below and advise.

## 2024-06-13 NOTE — Telephone Encounter (Signed)
 Copied from CRM #[id number]. Topic: General - Other >> Jun 12, 2024  2:53 PM [name] wrote: Reason for CRM: Pt needs to know how many hrs per week he can work. He wants to know if the dr wants him to go back to work full time or part time.

## 2024-06-13 NOTE — Telephone Encounter (Signed)
 We did not talk about him being out of work. I don't have a reason to suggest he is not able to work full time.

## 2024-06-16 ENCOUNTER — Emergency Department
Admission: EM | Admit: 2024-06-16 | Discharge: 2024-06-16 | Disposition: A | Discharge status: Home / Self Care | Attending: Emergency Medicine | Admitting: Emergency Medicine

## 2024-06-16 ENCOUNTER — Emergency Department

## 2024-06-16 DIAGNOSIS — R1010 Upper abdominal pain, unspecified: Secondary | ICD-10-CM | POA: Diagnosis present

## 2024-06-16 DIAGNOSIS — R1011 Right upper quadrant pain: Secondary | ICD-10-CM | POA: Diagnosis not present

## 2024-06-16 DIAGNOSIS — Z8673 Personal history of transient ischemic attack (TIA), and cerebral infarction without residual deficits: Secondary | ICD-10-CM | POA: Insufficient documentation

## 2024-06-16 DIAGNOSIS — R112 Nausea with vomiting, unspecified: Secondary | ICD-10-CM | POA: Diagnosis not present

## 2024-06-16 DIAGNOSIS — K3184 Gastroparesis: Secondary | ICD-10-CM | POA: Diagnosis not present

## 2024-06-16 DIAGNOSIS — J45909 Unspecified asthma, uncomplicated: Secondary | ICD-10-CM | POA: Insufficient documentation

## 2024-06-16 DIAGNOSIS — R1084 Generalized abdominal pain: Secondary | ICD-10-CM | POA: Diagnosis not present

## 2024-06-16 DIAGNOSIS — E1143 Type 2 diabetes mellitus with diabetic autonomic (poly)neuropathy: Secondary | ICD-10-CM | POA: Diagnosis not present

## 2024-06-16 DIAGNOSIS — E119 Type 2 diabetes mellitus without complications: Secondary | ICD-10-CM | POA: Insufficient documentation

## 2024-06-16 DIAGNOSIS — R1110 Vomiting, unspecified: Secondary | ICD-10-CM | POA: Diagnosis not present

## 2024-06-16 DIAGNOSIS — R001 Bradycardia, unspecified: Secondary | ICD-10-CM | POA: Diagnosis not present

## 2024-06-16 DIAGNOSIS — Z21 Asymptomatic human immunodeficiency virus [HIV] infection status: Secondary | ICD-10-CM | POA: Insufficient documentation

## 2024-06-16 LAB — CBC
HCT: 46.6 % (ref 39.0–52.0)
Hemoglobin: 15.6 g/dL (ref 13.0–17.0)
MCH: 29.4 pg (ref 26.0–34.0)
MCHC: 33.5 g/dL (ref 30.0–36.0)
MCV: 87.8 fL (ref 80.0–100.0)
Platelets: 207 K/uL (ref 150–400)
RBC: 5.31 MIL/uL (ref 4.22–5.81)
RDW: 12.8 % (ref 11.5–15.5)
WBC: 7.4 K/uL (ref 4.0–10.5)
nRBC: 0 % (ref 0.0–0.2)

## 2024-06-16 LAB — COMPREHENSIVE METABOLIC PANEL WITH GFR
ALT: 15 U/L (ref 0–44)
AST: 26 U/L (ref 15–41)
Albumin: 4.1 g/dL (ref 3.5–5.0)
Alkaline Phosphatase: 51 U/L (ref 38–126)
Anion gap: 9 (ref 5–15)
BUN: 12 mg/dL (ref 6–20)
CO2: 25 mmol/L (ref 22–32)
Calcium: 9.5 mg/dL (ref 8.9–10.3)
Chloride: 104 mmol/L (ref 98–111)
Creatinine, Ser: 1.12 mg/dL (ref 0.61–1.24)
GFR, Estimated: >60 mL/min (ref >60)
Glucose, Bld: 98 mg/dL (ref 70–99)
Potassium: 4 mmol/L (ref 3.5–5.1)
Sodium: 138 mmol/L (ref 135–145)
Total Bilirubin: 1 mg/dL (ref 0.0–1.2)
Total Protein: 7 g/dL (ref 6.5–8.1)

## 2024-06-16 LAB — URINALYSIS, ROUTINE W REFLEX MICROSCOPIC
Bacteria, UA: NONE SEEN
Bilirubin Urine: NEGATIVE
Glucose, UA: NEGATIVE mg/dL
Hgb urine dipstick: NEGATIVE
Ketones, ur: 20 mg/dL — AB
Leukocytes,Ua: NEGATIVE
Nitrite: NEGATIVE
Protein, ur: 30 mg/dL — AB
Specific Gravity, Urine: 1.028 (ref 1.005–1.030)
pH: 5 (ref 5.0–8.0)

## 2024-06-16 LAB — LIPASE, BLOOD: Lipase: 30 U/L (ref 11–51)

## 2024-06-16 MED ORDER — HYDROMORPHONE HCL 1 MG/ML IJ SOLN
1.0000 mg | Freq: Once | INTRAMUSCULAR | Status: AC
  Administered 2024-06-16: 1 mg via INTRAVENOUS
  Filled 2024-06-16: qty 1

## 2024-06-16 MED ORDER — PANTOPRAZOLE SODIUM 40 MG IV SOLR
40.0000 mg | Freq: Once | INTRAVENOUS | Status: AC
  Administered 2024-06-16: 40 mg via INTRAVENOUS
  Filled 2024-06-16: qty 10

## 2024-06-16 MED ORDER — METOCLOPRAMIDE HCL 5 MG/ML IJ SOLN
10.0000 mg | INTRAMUSCULAR | Status: AC
  Administered 2024-06-16: 10 mg via INTRAVENOUS
  Filled 2024-06-16: qty 2

## 2024-06-16 MED ORDER — SODIUM CHLORIDE 0.9 % IV BOLUS
1000.0000 mL | Freq: Once | INTRAVENOUS | Status: AC
  Administered 2024-06-16: 1000 mL via INTRAVENOUS

## 2024-06-16 MED ORDER — HALOPERIDOL LACTATE 5 MG/ML IJ SOLN
3.0000 mg | Freq: Once | INTRAMUSCULAR | Status: AC
  Administered 2024-06-16: 3 mg via INTRAVENOUS
  Filled 2024-06-16: qty 1

## 2024-06-16 MED ORDER — FAMOTIDINE 20 MG PO TABS
20.0000 mg | ORAL_TABLET | Freq: Two times a day (BID) | ORAL | 0 refills | Status: DC
Start: 2024-06-16 — End: 2024-07-18

## 2024-06-16 MED ORDER — KETOROLAC TROMETHAMINE 15 MG/ML IJ SOLN
15.0000 mg | Freq: Once | INTRAMUSCULAR | Status: AC
  Administered 2024-06-16: 15 mg via INTRAVENOUS
  Filled 2024-06-16: qty 1

## 2024-06-16 NOTE — ED Notes (Signed)
 EKG given to Dr. Vandy

## 2024-06-16 NOTE — ED Notes (Signed)
 Dr. Viviann aware of pt's intermittent bradycardia. Pt asymptomatic. A&Ox4. 100% RA. Denies any CP, SOB. Pt booked an uber ride to home in front of this RN due to having IV pain medications while in the ER. PT ambulatory to lobby with steady gait.

## 2024-06-16 NOTE — ED Notes (Signed)
 Pt given PO fluid challenge. Patient states to RN that the water has not stayed down well. Patient still feels nauseous.

## 2024-06-16 NOTE — ED Notes (Signed)
 Pt has UA cup at bedside.

## 2024-06-16 NOTE — ED Notes (Signed)
 Medications not given at scheduled time due to US  in room.

## 2024-06-16 NOTE — ED Notes (Signed)
 Patient stated he feels nauseated and rating pain 8/10 in abdomen, MD notified.

## 2024-06-16 NOTE — ED Triage Notes (Signed)
 Pt presents to the ED via [facility] from home. EMS was called out for nausea and vomiting for 3 weeks and 4 days of RUQ abdominal pain. Pt has a hx of gastric sleeve and gastroparesis. Pt A&Ox4.   BP 109/70 HR 66 97% SPO2 CBG 110

## 2024-06-16 NOTE — ED Provider Notes (Signed)
 [facility] Provider Note    Event Date/Time   First MD Initiated Contact with Patient 06/16/24 1503     (approximate)   History   Chief Complaint: Abdominal Pain   HPI  Troy [name] is a 53 y.o. male with a history of bipolar disorder, diabetes, bariatric surgery, Crohn's disease who comes ED due to upper abdominal pain, nausea and vomiting for the past 4 days, unable to eat or drink anything during this time.  No fever.  No chest pain or shortness of breath.        Past Medical History:  Diagnosis Date   Alcohol use disorder 12/11/2023   Asthma    Bipolar 1 disorder (HCC)    BPH (benign prostatic hyperplasia) 12/11/2023   Diabetes mellitus without complication (HCC)    controlled by weight loss   GERD (gastroesophageal reflux disease)    History of CVA with residual deficit    HIV infection (HCC)    Hyperlipidemia 08/22/2022   Migraine headache    2-3 X/month   Nephrolithiasis    PTSD (post-traumatic stress disorder) 12/11/2023   Wears dentures    full upper and lower    Current Outpatient Rx   Order #: [id number] Class: Normal   Order #: [id number] Class: Normal   Order #: [id number] Class: Normal   Order #: [id number] Class: Normal   Order #: [id number] Class: Normal   Order #: [id number] Class: Normal   Order #: [id number] Class: Normal   Order #: [id number] Class: Normal   Order #: [id number] Class: Normal   Order #: [id number] Class: Normal   Order #: [id number] Class: Normal   Order #: [id number] Class: Normal   Order #: [id number] Class: Normal   Order #: [id number] Class: Normal   Order #: [id number] Class: Normal   Order #: [id number] Class: Normal   Order #: [id number] Class: Normal   Order #: [id number] Class: Normal   Order #: [id number] Class: Normal   Order #: [id number] Class: Normal   Order #: [id number] Class: Normal   Order #: [id number] Class: Normal    Past Surgical History:  Procedure Laterality Date   COLONOSCOPY     COLONOSCOPY WITH PROPOFOL   N/A 05/30/2019   Procedure: COLONOSCOPY WITH PROPOFOL ;  Surgeon: Unk Corinn Skiff, MD;  Location: [facility] ENDOSCOPY;  Service: Gastroenterology;  Laterality: N/A;   COLONOSCOPY WITH PROPOFOL  N/A 03/22/2023   Procedure: COLONOSCOPY WITH PROPOFOL ;  Surgeon: Unk Corinn Skiff, MD;  Location: [facility] ENDOSCOPY;  Service: Gastroenterology;  Laterality: N/A;   ELBOW SURGERY Left    fracture   ESOPHAGOGASTRODUODENOSCOPY (EGD) WITH PROPOFOL  N/A 05/30/2019   Procedure: ESOPHAGOGASTRODUODENOSCOPY (EGD) WITH PROPOFOL ;  Surgeon: Unk Corinn Skiff, MD;  Location: [facility] ENDOSCOPY;  Service: Gastroenterology;  Laterality: N/A;   ESOPHAGOGASTRODUODENOSCOPY (EGD) WITH PROPOFOL  N/A 09/12/2019   Procedure: ESOPHAGOGASTRODUODENOSCOPY (EGD) WITH PROPOFOL ;  Surgeon: Unk Corinn Skiff, MD;  Location: [facility] ENDOSCOPY;  Service: Gastroenterology;  Laterality: N/A;   ESOPHAGOGASTRODUODENOSCOPY (EGD) WITH PROPOFOL  N/A 11/23/2022   Procedure: ESOPHAGOGASTRODUODENOSCOPY (EGD) WITH PROPOFOL ;  Surgeon: Unk Corinn Skiff, MD;  Location: [facility] ENDOSCOPY;  Service: Gastroenterology;  Laterality: N/A;   FACIAL COSMETIC SURGERY     GASTRIC BYPASS     HERNIA REPAIR     KIDNEY STONE SURGERY     renal artery repair      Physical Exam   Triage Vital Signs: ED Triage Vitals  Encounter Vitals Group     BP 06/16/24 1248 116/75     Girls Systolic BP Percentile --      Girls Diastolic BP Percentile --      Boys Systolic  BP Percentile --      Boys Diastolic BP Percentile --      Pulse Rate 06/16/24 1248 68     Resp 06/16/24 1248 18     Temp 06/16/24 1248 98.4 F (36.9 C)     Temp Source 06/16/24 1248 Oral     SpO2 06/16/24 1248 98 %     Weight 06/16/24 1249 160 lb (72.6 kg)     Height 06/16/24 1249 5' 8 (1.727 m)     Head Circumference --      Peak Flow --      Pain Score 06/16/24 1249 7     Pain Loc --      Pain Education --      Exclude from Growth Chart --     Most recent vital signs: Vitals:   06/16/24 2100  06/16/24 2115  BP: 113/69   Pulse: (!) 45 (!) 45  Resp: 16 14  Temp:    SpO2: 98% 99%    General: Awake, no distress.  CV:  Good peripheral perfusion.  Regular rate rhythm Resp:  Normal effort.  Clear lungs Abd:  No distention.  Soft with epigastric tenderness, no peritonitis Other:  Dry oral mucosa   ED Results / Procedures / Treatments   Labs (all labs ordered are listed, but only abnormal results are displayed) Labs Reviewed  URINALYSIS, ROUTINE W REFLEX MICROSCOPIC - Abnormal; Notable for the following components:      Result Value   Color, Urine YELLOW (*)    APPearance HAZY (*)    Ketones, ur 20 (*)    Protein, ur 30 (*)    All other components within normal limits  LIPASE, BLOOD  COMPREHENSIVE METABOLIC PANEL WITH GFR  CBC     EKG Interpreted by me Sinus bradycardia rate of 41.  Normal axis and intervals.  Normal QRS ST segments T waves   RADIOLOGY Ultrasound right upper quadrant interpreted by me, appears normal.  Radiology report reviewed   PROCEDURES:  Procedures   MEDICATIONS ORDERED IN ED: Medications  sodium chloride  0.9 % bolus 1,000 mL (1,000 mLs Intravenous New Bag/Given 06/16/24 1614)  ketorolac  (TORADOL ) 15 MG/ML injection 15 mg (15 mg Intravenous Given 06/16/24 1614)  pantoprazole  (PROTONIX ) injection 40 mg (40 mg Intravenous Given 06/16/24 1614)  metoCLOPramide  (REGLAN ) injection 10 mg (10 mg Intravenous Given 06/16/24 1614)  HYDROmorphone  (DILAUDID ) injection 1 mg (1 mg Intravenous Given 06/16/24 2025)  haloperidol  lactate (HALDOL ) injection 3 mg (3 mg Intravenous Given 06/16/24 2026)     IMPRESSION / MDM / ASSESSMENT AND PLAN / ED COURSE  I reviewed the triage vital signs and the nursing notes.  DDx: Dehydration, electrolyte derangement, pancreatitis, cholecystitis, gastroparesis, gastritis, UTI  Patient's presentation is most consistent with acute presentation with potential threat to life or bodily function.  Patient presents with  upper abdominal pain, epigastric tenderness, nausea vomiting.  Appears somewhat dehydrated.  Will give fluid bolus, Protonix  Reglan  Toradol .   Clinical Course as of 06/16/24 2211  Sun Jun 16, 2024  1659 US  interpreted by me, negative. Labs normal. Will PO trial.  [PS]    Clinical Course User Index [PS] Viviann Pastor, MD    ----------------------------------------- 10:10 PM on 06/16/2024 ----------------------------------------- Patient had some persistent nausea, but after Dilaudid  and Haldol  he is feeling much better, he is smiling, reports pain is resolved, tolerating oral intake.  He has had persistent bradycardia throughout his time in the ED with heart rate in the 40s, but no dizziness,  normal blood pressure, no orthostatic symptoms.  Stable for discharge.   FINAL CLINICAL IMPRESSION(S) / ED DIAGNOSES   Final diagnoses:  Pain of upper abdomen  Gastroparesis     Rx / DC Orders   ED Discharge Orders          Ordered    famotidine  (PEPCID ) 20 MG tablet  2 times daily        06/16/24 2208             Note:  This document was prepared using Dragon voice recognition software and may include unintentional dictation errors.   Viviann Pastor, MD 06/16/24 2211

## 2024-06-18 ENCOUNTER — Ambulatory Visit: Payer: Self-pay

## 2024-06-18 NOTE — Telephone Encounter (Signed)
 Noted

## 2024-06-18 NOTE — Telephone Encounter (Signed)
 FYI Only or Action Required?: FYI only for provider.  Patient was last seen in primary care on 05/13/2024 by Myrla Jon HERO, MD.  Called Nurse Triage reporting Abdominal Pain.  Symptoms began several weeks ago.  Interventions attempted: Other: Seen in ED on 8/24.  Symptoms are: unchanged.  Triage Disposition: See PCP Within 2 Weeks  Patient/caregiver understands and will follow disposition?: Yes        Copied from CRM [id number]. Topic: Clinical - Red Word Triage >> Jun 18, 2024 10:08 AM Troy [name] wrote: Red Word that prompted transfer to Nurse Triage: Received call from patient, reports he was seen at [facility] ED for evaluation discharged on Sunday 06/16/24, for abdominal pain, diarrhea, and nausea.  Patient reports symptoms have returned, abdominal pain, and extremely nauseated, states pain never went away.        Reason for Disposition  Abdominal pain is a chronic symptom (recurrent or ongoing AND present > 4 weeks)  Answer Assessment - Initial Assessment Questions 1. LOCATION: Where does it hurt?      Mid to upper abdomen  2. RADIATION: Does the pain shoot anywhere else? (e.g., chest, back)     7/10 3. ONSET: When did the pain begin? (Minutes, hours or days ago)      3-4 weeks ago  4. SUDDEN: Gradual or sudden onset?     Sudden  5. PATTERN Does the pain come and go, or is it constant?     Constant  6. SEVERITY: How bad is the pain?  (e.g., Scale 1-10; mild, moderate, or severe)     Moderate  7. RECURRENT SYMPTOM: Have you ever had this type of stomach pain before? If Yes, ask: When was the last time? and What happened that time?      Yes, history of gastroparesis  8. CAUSE: What do you think is causing the stomach pain? (e.g., gallstones, recent abdominal surgery)     Gastroparesis  9. RELIEVING/AGGRAVATING FACTORS: What makes it better or worse? (e.g., antacids, bending or twisting motion, bowel movement)     Eating exacerbates pain 10.  OTHER SYMPTOMS: Do you have any other symptoms? (e.g., back pain, diarrhea, fever, urination pain, vomiting)       Vomiting and diarrhea  Protocols used: Abdominal Pain - Male-A-AH

## 2024-06-20 ENCOUNTER — Encounter: Payer: Self-pay | Admitting: Family Medicine

## 2024-06-20 ENCOUNTER — Ambulatory Visit (INDEPENDENT_AMBULATORY_CARE_PROVIDER_SITE_OTHER): Admitting: Family Medicine

## 2024-06-20 ENCOUNTER — Other Ambulatory Visit: Payer: Self-pay

## 2024-06-20 VITALS — BP 119/82 | HR 59 | Temp 98.2°F | Ht 68.0 in | Wt 155.7 lb

## 2024-06-20 DIAGNOSIS — K5090 Crohn's disease, unspecified, without complications: Secondary | ICD-10-CM | POA: Diagnosis not present

## 2024-06-20 DIAGNOSIS — Z5986 Financial insecurity: Secondary | ICD-10-CM | POA: Diagnosis not present

## 2024-06-20 DIAGNOSIS — K3184 Gastroparesis: Secondary | ICD-10-CM | POA: Diagnosis not present

## 2024-06-20 DIAGNOSIS — Z59819 Housing instability, housed unspecified: Secondary | ICD-10-CM | POA: Diagnosis not present

## 2024-06-20 DIAGNOSIS — B20 Human immunodeficiency virus [HIV] disease: Secondary | ICD-10-CM

## 2024-06-20 DIAGNOSIS — G43709 Chronic migraine without aura, not intractable, without status migrainosus: Secondary | ICD-10-CM | POA: Diagnosis not present

## 2024-06-20 DIAGNOSIS — R001 Bradycardia, unspecified: Secondary | ICD-10-CM

## 2024-06-20 MED ORDER — VERAPAMIL HCL 40 MG PO TABS
40.0000 mg | ORAL_TABLET | Freq: Two times a day (BID) | ORAL | 2 refills | Status: AC
  Filled 2024-06-20: qty 60, 30d supply, fill #0

## 2024-06-20 MED ORDER — METOCLOPRAMIDE HCL 5 MG PO TABS
5.0000 mg | ORAL_TABLET | Freq: Three times a day (TID) | ORAL | 5 refills | Status: AC
  Filled 2024-06-20 – 2024-07-18 (×3): qty 120, 30d supply, fill #0
  Filled 2024-08-13: qty 120, 30d supply, fill #1

## 2024-06-20 NOTE — Assessment & Plan Note (Signed)
 Bradycardia Bradycardia observed during recent hospitalization with heart rate dropping to low 40s. Current heart rate is 59 bpm. Verapamil , used for migraine prophylaxis, may contribute to bradycardia. - Switch verapamil  to short-acting formulation at 40 mg twice daily to reduce bradycardia risk  Migraine Migraine prophylaxis with verapamil , which may contribute to bradycardia. Current dose is 120 mg daily. - Adjust verapamil  to 40 mg twice daily to manage migraines while minimizing bradycardia risk

## 2024-06-20 NOTE — Assessment & Plan Note (Signed)
 Chronic gastroparesis with recent exacerbation causing severe nausea, vomiting, significant weight loss, and inability to tolerate oral intake. Recent hospitalization for persistent nausea and dehydration requiring IV fluids. Financial constraints limit access to medications like Reglan , raising concerns about potential need for a feeding tube if condition worsens. - Send Reglan  prescription to hospital pharmacy for potential financial assistance - Coordinate with pharmacist to explore medication assistance programs - Refer to GI specialist for further evaluation and management - Discuss potential need for feeding tube if condition worsens

## 2024-06-20 NOTE — Progress Notes (Signed)
 Established patient visit   Patient: Troy [name]   DOB: 1971/10/20   53 y.o. Male  MRN: [id number] Visit Date: 06/20/2024  Today's healthcare provider: Jon Eva, MD   Chief Complaint  Patient presents with   Hospitalization Follow-up    Patient reports that he was in the ER due to abdominal pain and gastroparesis.  States that he was still sick up til last night and feels good today so far.  Would like to know if the gastroparesis will make him where he can't eat at all and wants to know next steps.    Subjective    HPI HPI     Hospitalization Follow-up    Additional comments: Patient reports that he was in the ER due to abdominal pain and gastroparesis.  States that he was still sick up til last night and feels good today so far.  Would like to know if the gastroparesis will make him where he can't eat at all and wants to know next steps.       Last edited by Terrel Powell CROME, CMA on 06/20/2024 10:11 AM.       Discussed the use of AI scribe software for clinical note transcription with the patient, who gave verbal consent to proceed.  History of Present Illness   Troy [name] is a 53 year old male with gastroparesis who presents with persistent nausea and inability to tolerate food.  For the past three weeks, he has experienced persistent nausea and an inability to tolerate food, leading to a recent ER visit for dehydration and persistent nausea. During the visit, he received fluids, Reglan , Protonix , Haldol , and Dilaudid , which provided temporary relief. He has managed only one meal in three weeks and can only tolerate candy. Attempts to eat regular food result in vomiting, though he recently managed to eat ten pizza rolls after initially vomiting them.  He is not taking Reglan  due to financial constraints after losing his job, affecting his ability to afford medication copays. He continues to receive Biktarvy  for free through a specialty pharmacy. He  is concerned about his inability to afford medications and the impact on his gastroparesis management.  He faces social challenges, including homelessness after his roommate moved out, and struggles with a lack of stable living conditions. He is worried about his ability to work full-time due to fatigue and lack of energy from his inability to eat, considering part-time work as a more feasible option.  During his recent ER visit, his heart rate was noted to be low, reaching the low forties, although it is slightly improved today at fifty-nine. He is currently on verapamil , which he believes may contribute to the low heart rate, but he did not take it during his hospital stay.         Medications: Outpatient Medications Prior to Visit  Medication Sig   ARIPiprazole  (ABILIFY ) 15 MG tablet Take 1 tablet (15 mg total) by mouth daily.   atorvastatin  (LIPITOR) 20 MG tablet Take 1 tablet (20 mg total) by mouth daily.   bictegravir-emtricitabine -tenofovir  AF (BIKTARVY ) 50-200-25 MG TABS tablet Take 1 tablet by mouth daily.   cetirizine  (ZYRTEC ) 10 MG tablet Take 1 tablet (10 mg total) by mouth daily.   eszopiclone  3 MG TABS Take 1 tablet (3 mg total) by mouth at bedtime as needed. Take immediately before bedtime   famotidine  (PEPCID ) 20 MG tablet Take 1 tablet (20 mg total) by mouth 2 (two) times daily for 10 days.  montelukast  (SINGULAIR ) 10 MG tablet Take 1 tablet (10 mg total) by mouth daily.   omeprazole  (PRILOSEC) 40 MG capsule Take 1 capsule (40 mg total) by mouth in the morning and at bedtime.   ondansetron  (ZOFRAN -ODT) 4 MG disintegrating tablet Take 1 tablet (4 mg total) by mouth every 8 (eight) hours as needed.   potassium citrate  (UROCIT-K ) 10 MEQ (1080 MG) SR tablet TAKE 1 TABLET (10 MEQ TOTAL) BY MOUTH 3 (THREE) TIMES DAILY WITH MEALS.   sertraline  (ZOLOFT ) 100 MG tablet Take 1.5 tablets (150 mg total) by mouth at bedtime.   SUMAtriptan  (IMITREX ) 100 MG tablet TAKE ONE TABLET BY MOUTH  AT ONSET OF THE HEADACHE. MAY REPEAT IN TWO HOURS   tadalafil  (CIALIS ) 5 MG tablet Take 1 tablet (5 mg total) by mouth daily.   tamsulosin  (FLOMAX ) 0.4 MG CAPS capsule Take 1 capsule (0.4 mg total) by mouth daily. Go back to 1 tablet daily   tiZANidine  (ZANAFLEX ) 4 MG tablet TAKE 1 TABLET BY MOUTH 2 TIMES DAILY. (Patient taking differently: Take 4 mg by mouth 2 (two) times daily as needed for muscle spasms.)   [DISCONTINUED] albuterol  (VENTOLIN  HFA) 108 (90 Base) MCG/ACT inhaler Inhale 2 puffs into the lungs every 6 (six) hours as needed for wheezing or shortness of breath. (Patient not taking: Reported on 05/29/2024)   [DISCONTINUED] beclomethasone (QVAR) 80 MCG/ACT inhaler Inhale 1 puff into the lungs 2 (two) times daily. (Patient not taking: Reported on 05/29/2024)   [DISCONTINUED] fluticasone  (FLONASE ) 50 MCG/ACT nasal spray Place 2 sprays into both nostrils daily. (Patient not taking: Reported on 05/29/2024)   [DISCONTINUED] metoCLOPramide  (REGLAN ) 5 MG tablet Take 1 tablet (5 mg total) by mouth 4 (four) times daily -  before meals and at bedtime.   [DISCONTINUED] mupirocin  ointment (BACTROBAN ) 2 % Apply 1 Application topically 2 (two) times daily. For 10 days (Patient not taking: Reported on 05/29/2024)   [DISCONTINUED] naproxen  (NAPROSYN ) 375 MG tablet TAKE 1 TABLET (375 MG TOTAL) BY MOUTH 2 (TWO) TIMES DAILY WITH A MEAL. (Patient not taking: Reported on 05/29/2024)   [DISCONTINUED] verapamil  (CALAN -SR) 120 MG CR tablet Take 1 tablet (120 mg total) by mouth at bedtime.   Facility-Administered Medications Prior to Visit  Medication Dose Route Frequency Provider   cyanocobalamin  (VITAMIN B12) injection 1,000 mcg  1,000 mcg Intramuscular Once Niel Peretti M, MD   cyanocobalamin  (VITAMIN B12) injection 1,000 mcg  1,000 mcg Intramuscular Q30 days Myrla Jon HERO, MD    Review of Systems     Objective    BP 119/82 (BP Location: Left Arm, Patient Position: Sitting, Cuff Size: Normal)   Pulse  (!) 59   Temp 98.2 F (36.8 C) (Oral)   Ht 5' 8 (1.727 m)   Wt 155 lb 11.2 oz (70.6 kg)   SpO2 100%   BMI 23.67 kg/m    Physical Exam Vitals reviewed.  Constitutional:      General: He is not in acute distress.    Appearance: Normal appearance. He is not diaphoretic.  HENT:     Head: Normocephalic and atraumatic.  Eyes:     General: No scleral icterus.    Conjunctiva/sclera: Conjunctivae normal.  Cardiovascular:     Rate and Rhythm: Regular rhythm. Bradycardia present.     Heart sounds: Normal heart sounds. No murmur heard. Pulmonary:     Effort: Pulmonary effort is normal. No respiratory distress.     Breath sounds: Normal breath sounds. No wheezing or rhonchi.  Abdominal:  General: There is no distension.     Palpations: Abdomen is soft.     Tenderness: There is no abdominal tenderness.  Musculoskeletal:     Cervical back: Neck supple.     Right lower leg: No edema.     Left lower leg: No edema.  Lymphadenopathy:     Cervical: No cervical adenopathy.  Skin:    General: Skin is warm and dry.     Findings: No rash.  Neurological:     Mental Status: He is alert and oriented to person, place, and time. Mental status is at baseline.  Psychiatric:        Mood and Affect: Mood normal.        Behavior: Behavior normal.      No results found for any visits on 06/20/24.  Assessment & Plan     Problem List Items Addressed This Visit       Cardiovascular and Mediastinum   Migraine headache   Bradycardia Bradycardia observed during recent hospitalization with heart rate dropping to low 40s. Current heart rate is 59 bpm. Verapamil , used for migraine prophylaxis, may contribute to bradycardia. - Switch verapamil  to short-acting formulation at 40 mg twice daily to reduce bradycardia risk  Migraine Migraine prophylaxis with verapamil , which may contribute to bradycardia. Current dose is 120 mg daily. - Adjust verapamil  to 40 mg twice daily to manage migraines while  minimizing bradycardia risk      Relevant Medications   verapamil  (CALAN ) 40 MG tablet     Digestive   Gastroparesis - Primary   Chronic gastroparesis with recent exacerbation causing severe nausea, vomiting, significant weight loss, and inability to tolerate oral intake. Recent hospitalization for persistent nausea and dehydration requiring IV fluids. Financial constraints limit access to medications like Reglan , raising concerns about potential need for a feeding tube if condition worsens. - Send Reglan  prescription to hospital pharmacy for potential financial assistance - Coordinate with pharmacist to explore medication assistance programs - Refer to GI specialist for further evaluation and management - Discuss potential need for feeding tube if condition worsens      Relevant Medications   metoCLOPramide  (REGLAN ) 5 MG tablet   Other Relevant Orders   AMB Referral [facility]I Care Management     Other   Crohn's disease without complication (HCC)   HIV disease (HCC)   Other Visit Diagnoses       Housing instability       Relevant Orders   AMB Referral [facility] Care Management     Patient cannot afford medications       Relevant Orders   AMB Referral [facility]BCI Care Management     Bradycardia               Human immunodeficiency virus (HIV) disease HIV managed with Biktarvy , provided free of charge through a specialty pharmacy.  Financial insecurity and homelessness Significant financial insecurity and homelessness affecting ability to afford medications and basic needs. Recent job and housing loss leading to increased stress and difficulty managing health conditions. - Connect with social work and other resources for assistance with housing and financial support - Provide letter for SSI disability application indicating inability to work full-time due to health conditions        Return in about 4 weeks (around 07/18/2024) for chronic disease f/u.       Jon Eva, MD   [facility] [phone] (phone) [phone] (fax)  [facility]

## 2024-06-21 ENCOUNTER — Other Ambulatory Visit: Payer: Self-pay

## 2024-06-21 ENCOUNTER — Telehealth: Payer: Self-pay

## 2024-06-21 NOTE — Progress Notes (Signed)
 Complex Care Management Note Care Guide Note  06/21/2024 Name: Troy [name] MRN: [id number] DOB: 1971/01/30  Troy [name] is a 53 y.o. year old male who is a primary care patient of Bacigalupo, Jon HERO, MD . The community resource team was consulted for assistance with Food Insecurity and housing.  SDOH screenings and interventions completed:  Yes  Social Drivers of Health From This Encounter   Food Insecurity: Food Insecurity Present (06/21/2024)   Hunger Vital Sign    Worried About Running Out of Food in the Last Year: Often true    Ran Out of Food in the Last Year: Often true  Housing: High Risk (06/21/2024)   Housing Stability Vital Sign    Unable to Pay for Housing in the Last Year: Yes    Homeless in the Last Year: Yes  Financial Resource Strain: Patient Declined (06/21/2024)   Overall Financial Resource Strain (CARDIA)    Difficulty of Paying Living Expenses: Patient declined  Transportation Needs: Patient Declined (06/21/2024)   PRAPARE - Administrator, Civil Service (Medical): Patient declined    Lack of Transportation (Non-Medical): Patient declined  Utilities: Patient Declined (06/21/2024)   Utilities    Threatened with loss of utilities: Patient declined    SDOH Interventions Today    Flowsheet Row Most Recent Value  SDOH Interventions   Food Insecurity Interventions Other (Comment)  [Emailed food pantry list for  County.]  Housing Interventions Other (Comment)  [Emailed shelter and housing resources.]  Transportation Interventions Patient Declined, Other (Comment)  [Emailed information for [employer] transportation.]  Utilities Interventions Patient Declined  Financial Strain Interventions Patient Declined     Care guide performed the following interventions: Patient provided with information about care guide support team and interviewed to confirm resource needs.  Follow Up Plan:  No further follow up planned at this time. The patient has been provided  with needed resources.  Encounter Outcome:  Patient Visit Completed  Latha Sta[facility]ton [facility]  [facility] Guide Direct Dial: [phone]  Fax: [phone] Website: [email or web address]

## 2024-06-27 ENCOUNTER — Other Ambulatory Visit: Payer: Self-pay | Admitting: Pharmacy Technician

## 2024-06-27 ENCOUNTER — Other Ambulatory Visit: Payer: Self-pay

## 2024-06-27 ENCOUNTER — Encounter (INDEPENDENT_AMBULATORY_CARE_PROVIDER_SITE_OTHER): Payer: Self-pay

## 2024-06-27 NOTE — Progress Notes (Signed)
 Specialty Pharmacy Refill Coordination Note  Troy [name] is a 53 y.o. male contacted today regarding refills of specialty medication(s) Bictegravir-Emtricitab-Tenofov (Biktarvy    Patient requested (Patient-Rptd) Delivery   Delivery date: 06/28/24 Verified address: (Patient-Rptd) [address] [place] [address]   Medication will be filled on 06/27/24.
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
## 2024-06-30 NOTE — Progress Notes (Unsigned)
 Chief complaint: follow-up for HIV disease on medications  Subjective:    Patient ID: Troy [name], male    DOB: May 15, 1971, 53 y.o.   MRN: [id number]  HPI  Past Medical History:  Diagnosis Date   Alcohol use disorder 12/11/2023   Asthma    Bipolar 1 disorder (HCC)    BPH (benign prostatic hyperplasia) 12/11/2023   Diabetes mellitus without complication (HCC)    controlled by weight loss   GERD (gastroesophageal reflux disease)    History of CVA with residual deficit    HIV infection (HCC)    Hyperlipidemia 08/22/2022   Migraine headache    2-3 X/month   Nephrolithiasis    PTSD (post-traumatic stress disorder) 12/11/2023   Wears dentures    full upper and lower    Past Surgical History:  Procedure Laterality Date   COLONOSCOPY     COLONOSCOPY WITH PROPOFOL  N/A 05/30/2019   Procedure: COLONOSCOPY WITH PROPOFOL ;  Surgeon: Unk Corinn Skiff, MD;  Location: [facility] ENDOSCOPY;  Service: Gastroenterology;  Laterality: N/A;   COLONOSCOPY WITH PROPOFOL  N/A 03/22/2023   Procedure: COLONOSCOPY WITH PROPOFOL ;  Surgeon: Unk Corinn Skiff, MD;  Location: [facility] ENDOSCOPY;  Service: Gastroenterology;  Laterality: N/A;   ELBOW SURGERY Left    fracture   ESOPHAGOGASTRODUODENOSCOPY (EGD) WITH PROPOFOL  N/A 05/30/2019   Procedure: ESOPHAGOGASTRODUODENOSCOPY (EGD) WITH PROPOFOL ;  Surgeon: Unk Corinn Skiff, MD;  Location: [facility] ENDOSCOPY;  Service: Gastroenterology;  Laterality: N/A;   ESOPHAGOGASTRODUODENOSCOPY (EGD) WITH PROPOFOL  N/A 09/12/2019   Procedure: ESOPHAGOGASTRODUODENOSCOPY (EGD) WITH PROPOFOL ;  Surgeon: Unk Corinn Skiff, MD;  Location: [facility] ENDOSCOPY;  Service: Gastroenterology;  Laterality: N/A;   ESOPHAGOGASTRODUODENOSCOPY (EGD) WITH PROPOFOL  N/A 11/23/2022   Procedure: ESOPHAGOGASTRODUODENOSCOPY (EGD) WITH PROPOFOL ;  Surgeon: Unk Corinn Skiff, MD;  Location: [facility] ENDOSCOPY;  Service: Gastroenterology;  Laterality: N/A;   FACIAL COSMETIC SURGERY     GASTRIC BYPASS     HERNIA  REPAIR     KIDNEY STONE SURGERY     renal artery repair      Family History  Problem Relation Age of Onset   Bipolar disorder Mother    COPD Mother    Diabetes Mother    Hypertension Mother    Hyperlipidemia Mother    Cervical cancer Mother    Healthy Father    Skin cancer Father        on face   Stroke Brother    Heart disease Brother    Seizures Brother    Thyroid  disease Brother    Leukemia Paternal Aunt    Hyperlipidemia Maternal Grandmother    Hypertension Maternal Grandmother    Diabetes Maternal Grandfather    Hyperlipidemia Maternal Grandfather    Prostate cancer Maternal Grandfather        metastatic d. 43s   Heart disease Paternal Grandfather    Hypertension Paternal Grandfather    Prostate cancer Other        two mat great uncles, met, d. 43s   Breast cancer Other       Social History   Socioeconomic History   Marital status: Single    Spouse name: Not on file   Number of children: 0   Years of education: Not on file   Highest education level: Some college, no degree  Occupational History   Occupation: IT sales professional: [employer]  Tobacco Use   Smoking status: Every Day    Types: E-cigarettes   Smokeless tobacco: Never   Tobacco comments:    Currently only vapes  Vaping Use   Vaping status: Every Day   Substances: Nicotine, Flavoring   Devices: Mod  Substance and Sexual Activity   Alcohol use: Not Currently    Comment: 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, active in [place]   Drug use: Not Currently    Comment: previously used, no IV drugs, 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   Sexual activity: Not Currently    Partners: Male    Comment: patient given condoms  Other Topics Concern   Not on file  Social History Narrative   Not on file   Social Drivers of Health   Financial Resource Strain: Patient Declined (06/21/2024)   Overall Financial Resource Strain (CARDIA)    Difficulty of Paying Living Expenses: Patient declined  Food Insecurity: Food Insecurity Present (06/21/2024)    Hunger Vital Sign    Worried About Running Out of Food in the Last Year: Often true    Ran Out of Food in the Last Year: Often true  Transportation Needs: Patient Declined (06/21/2024)   PRAPARE - Transportation    Lack of Transportation (Medical): Patient declined    Lack of Transportation (Non-Medical): Patient declined  Physical Activity: Sufficiently Active (10/05/2023)   Exercise Vital Sign    Days of Exercise per Week: 7 days    Minutes of Exercise per Session: 90 min  Stress: Stress Concern Present (10/05/2023)   [employer] of Occupational Health - Occupational Stress Questionnaire    Feeling of Stress : Very much  Social Connections: Socially Isolated (04/18/2023)   Social Connection and Isolation Panel    Frequency of Communication with Friends and Family: Once a week    Frequency of Social Gatherings with Friends and Family: Never    Attends Religious Services: Never    Database administrator or Organizations: No    Attends Engineer, structural: Not on file    Marital Status: Divorced    Allergies  Allergen Reactions   Niacin Anaphylaxis   Orange Oil Anaphylaxis   Zolpidem     Other reaction(s): Unknown Sleep walking and sleep eating     Current Outpatient Medications:    ARIPiprazole  (ABILIFY ) 15 MG tablet, Take 1 tablet (15 mg total) by mouth daily., Disp: 90 tablet, Rfl: 0   atorvastatin  (LIPITOR) 20 MG tablet, Take 1 tablet (20 mg total) by mouth daily., Disp: 90 tablet, Rfl: 1   bictegravir-emtricitabine -tenofovir  AF (BIKTARVY ) 50-200-25 MG TABS tablet, Take 1 tablet by mouth daily., Disp: 30 tablet, Rfl: 11   cetirizine  (ZYRTEC ) 10 MG tablet, Take 1 tablet (10 mg total) by mouth daily., Disp: 90 tablet, Rfl: 3   eszopiclone  3 MG TABS, Take 1 tablet (3 mg total) by mouth at bedtime as needed. Take immediately before bedtime, Disp: 30 tablet, Rfl: 1   famotidine  (PEPCID ) 20 MG tablet, Take 1 tablet (20 mg total) by mouth 2 (two) times daily for  10 days., Disp: 20 tablet, Rfl: 0   metoCLOPramide  (REGLAN ) 5 MG tablet, Take 1 tablet (5 mg total) by mouth 4 (four) times daily -  before meals and at bedtime., Disp: 120 tablet, Rfl: 5   montelukast  (SINGULAIR ) 10 MG tablet, Take 1 tablet (10 mg total) by mouth daily., Disp: 90 tablet, Rfl: 3   omeprazole  (PRILOSEC) 40 MG capsule, Take 1 capsule (40 mg total) by mouth in the morning and at bedtime., Disp: 60 capsule, Rfl: 5   ondansetron  (ZOFRAN -ODT) 4 MG disintegrating tablet, Take 1 tablet (4 mg total) by mouth every 8 (eight) hours as needed., Disp:  30 tablet, Rfl: 0   potassium citrate  (UROCIT-K ) 10 MEQ (1080 MG) SR tablet, TAKE 1 TABLET (10 MEQ TOTAL) BY MOUTH 3 (THREE) TIMES DAILY WITH MEALS., Disp: 270 tablet, Rfl: 2   sertraline  (ZOLOFT ) 100 MG tablet, Take 1.5 tablets (150 mg total) by mouth at bedtime., Disp: 135 tablet, Rfl: 1   SUMAtriptan  (IMITREX ) 100 MG tablet, TAKE ONE TABLET BY MOUTH AT ONSET OF THE HEADACHE. MAY REPEAT IN TWO HOURS, Disp: 10 tablet, Rfl: 5   tadalafil  (CIALIS ) 5 MG tablet, Take 1 tablet (5 mg total) by mouth daily., Disp: 30 tablet, Rfl: 11   tamsulosin  (FLOMAX ) 0.4 MG CAPS capsule, Take 1 capsule (0.4 mg total) by mouth daily. Go back to 1 tablet daily, Disp: 30 capsule, Rfl: 5   tiZANidine  (ZANAFLEX ) 4 MG tablet, TAKE 1 TABLET BY MOUTH 2 TIMES DAILY. (Patient taking differently: Take 4 mg by mouth 2 (two) times daily as needed for muscle spasms.), Disp: 180 tablet, Rfl: 1   verapamil  (CALAN ) 40 MG tablet, Take 1 tablet (40 mg total) by mouth 2 (two) times daily., Disp: 60 tablet, Rfl: 2  Current Facility-Administered Medications:    cyanocobalamin  (VITAMIN B12) injection 1,000 mcg, 1,000 mcg, Intramuscular, Once, Bacigalupo, Angela M, MD   cyanocobalamin  (VITAMIN B12) injection 1,000 mcg, 1,000 mcg, Intramuscular, Q30 days, Bacigalupo, Angela M, MD   Review of Systems     Objective:   Physical Exam        Assessment & Plan:

## 2024-07-01 ENCOUNTER — Other Ambulatory Visit (HOSPITAL_COMMUNITY)
Admission: RE | Admit: 2024-07-01 | Discharge: 2024-07-01 | Disposition: A | Discharge status: Home / Self Care | Source: Ambulatory Visit | Attending: Infectious Disease | Admitting: Infectious Disease

## 2024-07-01 ENCOUNTER — Ambulatory Visit (INDEPENDENT_AMBULATORY_CARE_PROVIDER_SITE_OTHER): Admitting: Infectious Disease

## 2024-07-01 ENCOUNTER — Other Ambulatory Visit: Payer: Self-pay

## 2024-07-01 VITALS — BP 121/76 | HR 79 | Temp 97.8°F | Ht 68.0 in | Wt 155.2 lb

## 2024-07-01 DIAGNOSIS — F1090 Alcohol use, unspecified, uncomplicated: Secondary | ICD-10-CM

## 2024-07-01 DIAGNOSIS — Z113 Encounter for screening for infections with a predominantly sexual mode of transmission: Secondary | ICD-10-CM | POA: Insufficient documentation

## 2024-07-01 DIAGNOSIS — F4310 Post-traumatic stress disorder, unspecified: Secondary | ICD-10-CM | POA: Diagnosis not present

## 2024-07-01 DIAGNOSIS — G43709 Chronic migraine without aura, not intractable, without status migrainosus: Secondary | ICD-10-CM

## 2024-07-01 DIAGNOSIS — E785 Hyperlipidemia, unspecified: Secondary | ICD-10-CM | POA: Insufficient documentation

## 2024-07-01 DIAGNOSIS — R634 Abnormal weight loss: Secondary | ICD-10-CM

## 2024-07-01 DIAGNOSIS — K3184 Gastroparesis: Secondary | ICD-10-CM | POA: Insufficient documentation

## 2024-07-01 DIAGNOSIS — Z1212 Encounter for screening for malignant neoplasm of rectum: Secondary | ICD-10-CM | POA: Diagnosis not present

## 2024-07-01 DIAGNOSIS — B20 Human immunodeficiency virus [HIV] disease: Secondary | ICD-10-CM | POA: Diagnosis not present

## 2024-07-01 DIAGNOSIS — Z7185 Encounter for immunization safety counseling: Secondary | ICD-10-CM | POA: Insufficient documentation

## 2024-07-01 DIAGNOSIS — E46 Unspecified protein-calorie malnutrition: Secondary | ICD-10-CM | POA: Diagnosis not present

## 2024-07-01 DIAGNOSIS — Z23 Encounter for immunization: Secondary | ICD-10-CM | POA: Diagnosis not present

## 2024-07-01 DIAGNOSIS — Z7689 Persons encountering health services in other specified circumstances: Secondary | ICD-10-CM | POA: Diagnosis not present

## 2024-07-01 MED ORDER — BIKTARVY 50-200-25 MG PO TABS
1.0000 | ORAL_TABLET | Freq: Every day | ORAL | 11 refills | Status: AC
  Filled 2024-07-01 – 2024-07-26 (×2): qty 30, 30d supply, fill #0
  Filled 2024-08-21: qty 30, 30d supply, fill #1
  Filled 2024-09-26 – 2024-10-28 (×3): qty 30, 30d supply, fill #2
  Filled 2024-11-20: qty 30, 30d supply, fill #3

## 2024-07-02 ENCOUNTER — Ambulatory Visit (INDEPENDENT_AMBULATORY_CARE_PROVIDER_SITE_OTHER): Admitting: Psychiatry

## 2024-07-02 ENCOUNTER — Other Ambulatory Visit: Payer: Self-pay

## 2024-07-02 ENCOUNTER — Encounter: Payer: Self-pay | Admitting: Psychiatry

## 2024-07-02 VITALS — BP 118/83 | HR 65 | Temp 98.0°F | Ht 68.0 in | Wt 154.2 lb

## 2024-07-02 DIAGNOSIS — F3131 Bipolar disorder, current episode depressed, mild: Secondary | ICD-10-CM

## 2024-07-02 DIAGNOSIS — F4310 Post-traumatic stress disorder, unspecified: Secondary | ICD-10-CM

## 2024-07-02 DIAGNOSIS — G4700 Insomnia, unspecified: Secondary | ICD-10-CM

## 2024-07-02 DIAGNOSIS — Z7689 Persons encountering health services in other specified circumstances: Secondary | ICD-10-CM | POA: Diagnosis not present

## 2024-07-02 LAB — T-HELPER CELLS (CD4) COUNT (NOT AT ARMC)
CD4 % Helper T Cell: 31 % — ABNORMAL LOW (ref 33–65)
CD4 T Cell Abs: 405 /uL (ref 400–1790)

## 2024-07-02 LAB — CYTOLOGY, (ORAL, ANAL, URETHRAL) ANCILLARY ONLY
Chlamydia: NEGATIVE
Chlamydia: NEGATIVE
Comment: NEGATIVE
Comment: NEGATIVE
Comment: NORMAL
Comment: NORMAL
Neisseria Gonorrhea: NEGATIVE
Neisseria Gonorrhea: NEGATIVE

## 2024-07-02 LAB — URINE CYTOLOGY ANCILLARY ONLY
Chlamydia: NEGATIVE
Comment: NEGATIVE
Comment: NORMAL
Neisseria Gonorrhea: NEGATIVE

## 2024-07-02 NOTE — Patient Instructions (Signed)
 Continue Abilify  15 mg daily Restart sertraline  150 mg at night  Restart Lunesta  3 mg at night as needed for sleep  Next appointment: 11/4 at 4 pm,

## 2024-07-03 LAB — LIPID PANEL
Cholesterol: 161 mg/dL (ref <200)
HDL: 47 mg/dL (ref >=40)
LDL Cholesterol (Calc): 86 mg/dL
Non-HDL Cholesterol (Calc): 114 mg/dL (ref <130)
Total CHOL/HDL Ratio: 3.4 (calc) (ref <5.0)
Triglycerides: 189 mg/dL — ABNORMAL HIGH (ref <150)

## 2024-07-03 LAB — HIV-1 RNA QUANT-NO REFLEX-BLD
HIV 1 RNA Quant: <20 {copies}/mL — AB
HIV-1 RNA Quant, Log: <1.3 {Log_copies}/mL — AB

## 2024-07-03 LAB — RPR: RPR Ser Ql: NONREACTIVE

## 2024-07-03 NOTE — Progress Notes (Deleted)
   07/03/2024 Name: Troy [name] MRN: [id number] DOB: 03-Feb-1971  No chief complaint on file.   {Visit Type:26650}   Subjective:  Care Team: Primary Care Provider: Myrla Jon HERO, MD ; Next Scheduled Visit: *** {careteamprovider:27366}  Medication Access/Adherence  Current Pharmacy:  [facility] [address] [place] - [address] [place] [name] [address] Phone: [phone] Fax: [phone]  [place]roy [name] - [facility] [address] [place] [address] Phone: [phone] Fax: [phone]  [name]   Patient reports affordability concerns with their medications: {YES/NO:21197} Patient reports access/transportation concerns to their pharmacy: {YES/NO:21197} Patient reports adherence concerns with their medications:  {YES/NO:21197} ***   {Pharmacy S/O Choices:26420}   Objective:  Lab Results  Component Value Date   HGBA1C 4.9 01/16/2024    Lab Results  Component Value Date   CREATININE 1.12 06/16/2024   BUN 12 06/16/2024   NA 138 06/16/2024   K 4.0 06/16/2024   CL 104 06/16/2024   CO2 25 06/16/2024    Lab Results  Component Value Date   CHOL 161 07/01/2024   HDL 47 07/01/2024   LDLCALC 86 07/01/2024   TRIG 189 (H) 07/01/2024   CHOLHDL 3.4 07/01/2024    Medications Reviewed Today   Medications were not reviewed in this encounter       Assessment/Plan:   {Pharmacy A/P Choices:26421}  Follow Up Plan: ***  ***

## 2024-07-04 ENCOUNTER — Ambulatory Visit

## 2024-07-04 LAB — CYTOLOGY - PAP: Diagnosis: NEGATIVE

## 2024-07-04 NOTE — Progress Notes (Deleted)
   07/04/2024 Name: Troy [name] MRN: [id number] DOB: Dec 17, 1970  No chief complaint on file.   Troy [name] is a 53 y.o. year old male who was referred for medication management by their primary care provider, Bacigalupo, Jon HERO, MD. They presented for a face to face visit today.   They were referred to the pharmacist by their PCP for assistance in managing medication access    Subjective:  Care Team: Primary Care Provider: Myrla Jon HERO, MD  Infectious Disease: Fleeta Rothman, Jomarie, MD Behavioral Health: Katheren Sleet, MD  Medication Access/Adherence  [facility]acy[facility]  [facility] [address] [place] - [address] [address] Phone: [phone] Fax: [phone]  [facility] - [facility] [address] [place] [address] Phone: [phone] Fax: [phone]  [facility] - [facility] [address] [place] [address] Phone: [phone] Fax: [phone]   Patient reports affordability concerns with their medications: {YES/NO:21197} Patient reports access/transportation concerns to their pharmacy: {YES/NO:21197} Patient reports adherence concerns with their medications:  {YES/NO:21197} ***   {Pharmacy S/O Choices:26420}   Objective:  Lab Results  Component Value Date   HGBA1C 4.9 01/16/2024    Lab Results  Component Value Date   CREATININE 1.12 06/16/2024   BUN 12 06/16/2024   NA 138 06/16/2024   K 4.0 06/16/2024   CL 104 06/16/2024   CO2 25 06/16/2024    Lab Results  Component Value Date   CHOL 161 07/01/2024   HDL 47 07/01/2024   LDLCALC 86 07/01/2024   TRIG 189 (H) 07/01/2024   CHOLHDL 3.4 07/01/2024    Medications Reviewed Today   Medications were not reviewed in this encounter       Assessment/Plan:   {Pharmacy A/P Choices:26421}  Follow Up Plan: ***  ***

## 2024-07-05 DIAGNOSIS — Z419 Encounter for procedure for purposes other than remedying health state, unspecified: Secondary | ICD-10-CM | POA: Diagnosis not present

## 2024-07-08 DIAGNOSIS — Z23 Encounter for immunization: Secondary | ICD-10-CM | POA: Diagnosis not present

## 2024-07-10 ENCOUNTER — Ambulatory Visit

## 2024-07-11 ENCOUNTER — Telehealth: Payer: Self-pay | Admitting: Family Medicine

## 2024-07-11 NOTE — Telephone Encounter (Signed)
 Copied from CRM [id number]. Topic: Appointments - Appointment Scheduling >> Jul 11, 2024  9:09 AM Turkey B wrote: Patient called in about wanting to now why appt with pharmacist for 09/17 was cancelled. Please cb

## 2024-07-11 NOTE — Telephone Encounter (Signed)
 Brief Telephone Documentation Reason for Call: Patient left message regarding question for pharmacist   Summary of Call: Called patient regarding missed appointment on 07/10/24. Patient reports he was unaware that appointment was scheduled for 07/10/24.  Discussed medication cost. Reports that most medications are $4 at the pharmacy, aside from [place] ($0) andtadalafil  (~$18). [facility]   Rescheduled for same day appointment on 07/18/24 as PCP.   Follow Up: Patient given direct line for further questions/concerns.  Olivia Royse E. Marsh, PharmD Clinical Pharmacist [facility] [phone]

## 2024-07-18 ENCOUNTER — Other Ambulatory Visit: Payer: Self-pay

## 2024-07-18 ENCOUNTER — Encounter: Payer: Self-pay | Admitting: Family Medicine

## 2024-07-18 ENCOUNTER — Ambulatory Visit (INDEPENDENT_AMBULATORY_CARE_PROVIDER_SITE_OTHER)

## 2024-07-18 ENCOUNTER — Ambulatory Visit (INDEPENDENT_AMBULATORY_CARE_PROVIDER_SITE_OTHER): Admitting: Family Medicine

## 2024-07-18 ENCOUNTER — Other Ambulatory Visit (HOSPITAL_COMMUNITY): Payer: Self-pay

## 2024-07-18 VITALS — BP 125/91 | HR 56 | Ht 68.0 in | Wt 156.2 lb

## 2024-07-18 DIAGNOSIS — B20 Human immunodeficiency virus [HIV] disease: Secondary | ICD-10-CM

## 2024-07-18 DIAGNOSIS — L0100 Impetigo, unspecified: Secondary | ICD-10-CM | POA: Diagnosis not present

## 2024-07-18 DIAGNOSIS — F319 Bipolar disorder, unspecified: Secondary | ICD-10-CM

## 2024-07-18 DIAGNOSIS — K3184 Gastroparesis: Secondary | ICD-10-CM

## 2024-07-18 DIAGNOSIS — G43709 Chronic migraine without aura, not intractable, without status migrainosus: Secondary | ICD-10-CM

## 2024-07-18 DIAGNOSIS — Z5986 Financial insecurity: Secondary | ICD-10-CM

## 2024-07-18 DIAGNOSIS — L989 Disorder of the skin and subcutaneous tissue, unspecified: Secondary | ICD-10-CM | POA: Diagnosis not present

## 2024-07-18 DIAGNOSIS — R079 Chest pain, unspecified: Secondary | ICD-10-CM | POA: Diagnosis not present

## 2024-07-18 MED ORDER — BACITRACIN 500 UNIT/GM EX OINT
1.0000 | TOPICAL_OINTMENT | Freq: Two times a day (BID) | CUTANEOUS | 0 refills | Status: DC
  Filled 2024-07-18: qty 28, 16d supply, fill #0

## 2024-07-18 MED ORDER — FAMOTIDINE 20 MG PO TABS
20.0000 mg | ORAL_TABLET | Freq: Two times a day (BID) | ORAL | 0 refills | Status: AC
  Filled 2024-07-18: qty 20, 10d supply, fill #0

## 2024-07-18 MED ORDER — NAPROXEN 375 MG PO TABS
375.0000 mg | ORAL_TABLET | Freq: Two times a day (BID) | ORAL | 2 refills | Status: AC
  Filled 2024-07-18 – 2024-08-13 (×2): qty 60, 30d supply, fill #0

## 2024-07-18 MED ORDER — ARIPIPRAZOLE 15 MG PO TABS
15.0000 mg | ORAL_TABLET | Freq: Every day | ORAL | 0 refills | Status: DC
  Filled 2024-07-18: qty 30, 30d supply, fill #0
  Filled 2024-07-18: qty 90, 90d supply, fill #0
  Filled 2024-08-13: qty 30, 30d supply, fill #1
  Filled 2024-09-24: qty 30, 30d supply, fill #2

## 2024-07-18 MED ORDER — CETIRIZINE HCL 10 MG PO TABS
10.0000 mg | ORAL_TABLET | Freq: Every day | ORAL | 3 refills | Status: AC
  Filled 2024-07-18: qty 30, 30d supply, fill #0
  Filled 2024-08-13: qty 30, 30d supply, fill #1
  Filled 2024-09-24: qty 30, 30d supply, fill #2
  Filled 2024-10-27: qty 30, 30d supply, fill #3

## 2024-07-18 MED ORDER — ESZOPICLONE 3 MG PO TABS
3.0000 mg | ORAL_TABLET | Freq: Every evening | ORAL | 1 refills | Status: DC | PRN
  Filled 2024-07-18: qty 30, 30d supply, fill #0

## 2024-07-18 MED ORDER — VERAPAMIL HCL ER 120 MG PO TBCR
120.0000 mg | EXTENDED_RELEASE_TABLET | Freq: Every day | ORAL | 1 refills | Status: AC
  Filled 2024-07-18 (×2): qty 90, 90d supply, fill #0

## 2024-07-18 MED FILL — Potassium Citrate Tab ER 10 MEQ (1080 MG): ORAL | 90 days supply | Qty: 270 | Fill #0 | Status: CN

## 2024-07-18 MED FILL — Tamsulosin HCl Cap 0.4 MG: ORAL | 30 days supply | Qty: 30 | Fill #0 | Status: AC

## 2024-07-18 MED FILL — Tizanidine HCl Tab 4 MG (Base Equivalent): ORAL | 90 days supply | Qty: 180 | Fill #0 | Status: AC

## 2024-07-18 MED FILL — Potassium Citrate Tab ER 10 MEQ (1080 MG): ORAL | 90 days supply | Qty: 270 | Fill #0 | Status: AC

## 2024-07-18 NOTE — Progress Notes (Signed)
 Established patient visit   Patient: Troy [name]   DOB: 1971-04-05   53 y.o. Male  MRN: [id number] Visit Date: 07/18/2024  Today's healthcare provider: Jon Eva, MD   Chief Complaint  Patient presents with   Medical Management of Chronic Issues    Patient is present for follow-up and reports he has not taken medications for 1 month due to financial concerns but met with pharmacist today. He reports he felt like he was having a heart attack last weka due to being really sweaty and chest started hurting. Reports once he stopped sweating he was able to calm down. Episode took place doing walk that he does everyday. Unsure if he was over heating, hyperventilating, or having heart attack. No more episodes since then.    Hyperlipidemia   Medication Consultation    Would like rx for cream on lip again     Anxiety    Patient reports concerns of having difficulty concentrating and not sure if it is due to not being on anxiety meds or possible adhd as he was diagnosed when younger but mother declined him taking medication   Subjective    Hyperlipidemia  Anxiety     HPI     Medical Management of Chronic Issues    Additional comments: Patient is present for follow-up and reports he has not taken medications for 1 month due to financial concerns but met with pharmacist today. He reports he felt like he was having a heart attack last weka due to being really sweaty and chest started hurting. Reports once he stopped sweating he was able to calm down. Episode took place doing walk that he does everyday. Unsure if he was over heating, hyperventilating, or having heart attack. No more episodes since then.         Medication Consultation    Additional comments: Would like rx for cream on lip again          Anxiety    Additional comments: Patient reports concerns of having difficulty concentrating and not sure if it is due to not being on anxiety meds or possible adhd as he  was diagnosed when younger but mother declined him taking medication      Last edited by Lilian Fitzpatrick, CMA on 07/18/2024  9:24 AM.       Discussed the use of AI scribe software for clinical note transcription with the patient, who gave verbal consent to proceed.  History of Present Illness   Troy [name] is a 53 year old male with HIV, chronic gastroparesis, and bipolar disorder who presents for medication management and evaluation of chest pain.  He experienced a significant episode of central chest pain last week during his daily walk, with profuse sweating and hyperventilation. The episode lasted about an hour and resolved without intervention. No similar episodes have occurred since, and he denies current chest pain.  He has not been on any medications for a month due to difficulties in obtaining them, affecting his migraine prophylaxis. He is currently taking metoclopramide  for chronic gastroparesis and Biktarvy  for HIV.  Financial insecurity and homelessness impact his ability to fill prescriptions. He has bipolar disorder managed by psychiatry.  He reports a recurrent skin condition, described as impetigo, occurring every winter, treated with topical antibiotic cream. A persistent skin lesion has been present for three months, which he is concerned about.  He has concentration difficulties, unsure if related to anxiety or ADHD, managed with diet control since childhood.  Medications: Outpatient Medications Prior to Visit  Medication Sig   atorvastatin  (LIPITOR) 20 MG tablet Take 1 tablet (20 mg total) by mouth daily.   bictegravir-emtricitabine -tenofovir  AF (BIKTARVY ) 50-200-25 MG TABS tablet Take 1 tablet by mouth daily.   eszopiclone  3 MG TABS Take 1 tablet (3 mg total) by mouth at bedtime as needed. Take immediately before bedtime   metoCLOPramide  (REGLAN ) 5 MG tablet Take 1 tablet (5 mg total) by mouth 4 (four) times daily -  before meals and at bedtime.    montelukast  (SINGULAIR ) 10 MG tablet Take 1 tablet (10 mg total) by mouth daily.   omeprazole  (PRILOSEC) 40 MG capsule Take 1 capsule (40 mg total) by mouth in the morning and at bedtime.   ondansetron  (ZOFRAN -ODT) 4 MG disintegrating tablet Take 1 tablet (4 mg total) by mouth every 8 (eight) hours as needed.   potassium citrate  (UROCIT-K ) 10 MEQ (1080 MG) SR tablet TAKE 1 TABLET (10 MEQ TOTAL) BY MOUTH 3 (THREE) TIMES DAILY WITH MEALS.   sertraline  (ZOLOFT ) 100 MG tablet Take 1.5 tablets (150 mg total) by mouth at bedtime.   SUMAtriptan  (IMITREX ) 100 MG tablet TAKE ONE TABLET BY MOUTH AT ONSET OF THE HEADACHE. MAY REPEAT IN TWO HOURS   tadalafil  (CIALIS ) 5 MG tablet Take 1 tablet (5 mg total) by mouth daily.   tamsulosin  (FLOMAX ) 0.4 MG CAPS capsule Take 1 capsule (0.4 mg total) by mouth daily. Go back to 1 tablet daily   tiZANidine  (ZANAFLEX ) 4 MG tablet TAKE 1 TABLET BY MOUTH 2 TIMES DAILY.   verapamil  (CALAN ) 40 MG tablet Take 1 tablet (40 mg total) by mouth 2 (two) times daily.   [DISCONTINUED] ARIPiprazole  (ABILIFY ) 15 MG tablet Take 1 tablet (15 mg total) by mouth daily.   Facility-Administered Medications Prior to Visit  Medication Dose Route Frequency Provider   cyanocobalamin  (VITAMIN B12) injection 1,000 mcg  1,000 mcg Intramuscular Once Jasper Ruminski M, MD   cyanocobalamin  (VITAMIN B12) injection 1,000 mcg  1,000 mcg Intramuscular Q30 days Myrla Jon HERO, MD    Review of Systems     Objective    BP (!) 125/91 (BP Location: Left Arm, Patient Position: Sitting, Cuff Size: Normal)   Pulse (!) 56   Ht 5' 8 (1.727 m)   Wt 156 lb 3.2 oz (70.9 kg)   SpO2 100%   BMI 23.75 kg/m    Physical Exam Vitals reviewed.  Constitutional:      General: He is not in acute distress.    Appearance: Normal appearance. He is not diaphoretic.  HENT:     Head: Normocephalic and atraumatic.  Eyes:     General: No scleral icterus.    Conjunctiva/sclera: Conjunctivae normal.   Cardiovascular:     Rate and Rhythm: Normal rate and regular rhythm.     Heart sounds: Normal heart sounds. No murmur heard. Pulmonary:     Effort: Pulmonary effort is normal. No respiratory distress.     Breath sounds: Normal breath sounds. No wheezing or rhonchi.  Musculoskeletal:     Cervical back: Neck supple.     Right lower leg: No edema.     Left lower leg: No edema.  Lymphadenopathy:     Cervical: No cervical adenopathy.  Skin:    General: Skin is warm and dry.     Findings: No rash.  Neurological:     Mental Status: He is alert and oriented to person, place, and time. Mental status is at baseline.  Psychiatric:  Mood and Affect: Mood normal.        Behavior: Behavior normal.      No results found for any visits on 07/18/24.  Assessment & Plan     Problem List Items Addressed This Visit       Cardiovascular and Mediastinum   Migraine headache     Digestive   Gastroparesis     Other   HIV disease (HCC)   Relevant Medications   bacitracin  500 UNIT/GM ointment   Bipolar 1 disorder (HCC) - Primary   Other Visit Diagnoses       Skin lesion       Relevant Orders   Ambulatory referral to Dermatology     Chest pain, unspecified type       Relevant Orders   Ambulatory referral to Cardiology           Chest pain, exertional (possible cardiac etiology) Recent episode of exertional chest pain with diaphoresis and shortness of breath, lasting approximately one hour. Differential includes cardiac etiology, given age and symptomatology. - Refer to cardiology for further evaluation of chest pain.  Bradycardia related to verapamil  use Bradycardia with verapamil  use, leading to a change in dosage and formulation. Current heart rate in the 50s, and blood pressure slightly elevated off medication. - Monitor heart rate and blood pressure once medication access is restored.  Migraine prophylaxis and management Migraines managed with verapamil , which caused  bradycardia. Dosage and formulation changed, but effectiveness of lower dose unknown due to lack of medication access. - Monitor response to lower dose of verapamil  once medication access is restored. - Discuss alternative migraine prophylaxis with neurology if verapamil  remains unsuitable.  Bipolar I disorder Managed by psychiatry. Requires a refill of Abilify  to maintain stability. - Provide a one-month refill of Abilify  to ensure continuity of care until regular refills are available.  Attention and concentration issues Difficulty with concentration, possibly related to ADHD or anxiety. History of ADHD diagnosis in childhood, managed with diet control. Currently on anxiety medication. - Discuss with psychiatry to evaluate for ADHD versus anxiety and consider appropriate management.  Gastroparesis Chronic gastroparesis managed with metoclopramide .  Impetigo Recurrent impetigo presenting with honey crusting, typically occurring in winter. Responds well to topical antibiotics. - Prescribe bacitracin  topical antibiotic.  Actinic keratosis (possible) Persistent skin lesion present for three months, possibly actinic keratosis. Differential includes basal cell carcinoma. No definitive diagnosis made. - Refer to dermatology for evaluation of the skin lesion.  Financial insecurity and barriers to medication access Significant financial insecurity and homelessness impacting ability to access medications. Currently living without stable housing and lacks transportation, complicating ability to fill prescriptions. - Coordinate with pharmacist Isaiah to manage prescription access and transfer medications to the community pharmacy. - Ensure Abilify  is refilled for one month to bridge until regular refills are available.       Return in about 3 months (around 10/17/2024) for chronic disease f/u.       Jon Eva, MD  [facility] [phone]  (phone) [phone] (fax)  [facility]

## 2024-07-18 NOTE — Progress Notes (Signed)
 07/18/2024 Name: Troy [name] MRN: [id number] DOB: Apr 04, 1971  No chief complaint on file.   Troy [name] is a 53 y.o. year old male who was referred for medication management by their primary care provider, Bacigalupo, Jon HERO, MD. They presented for a face to face visit today.   They were referred to the pharmacist by their PCP for assistance in managing medication access    Subjective:  Care Team: Primary Care Provider: Myrla Jon HERO, MD  Infectious Disease: Fleeta Rothman, Jomarie, MD Behavioral Health: Katheren Sleet, MD  Medication Access/Adherence  Current Pharmacy:  [facility] - [facility] [address] [place] [address] Phone: [phone] Fax: [phone]  [facility] [address] [place] [address] Phone: [phone] Fax: [phone]   Patient reports affordability concerns with their medications: Yes  Patient reports access/transportation concerns to their pharmacy: No  Patient reports adherence concerns with their medications:  Yes  - not taking multiple medications d/t inability to afford copays   Patient has been previously homeless, living in a car. He reports difficulty affording medications, even with $4 copays from Medicaid as he is unemployed. Patient is starting a new job in the coming week, however.    Objective:  Lab Results  Component Value Date   HGBA1C 4.9 01/16/2024    Lab Results  Component Value Date   CREATININE 1.12 06/16/2024   BUN 12 06/16/2024   NA 138 06/16/2024   K 4.0 06/16/2024   CL 104 06/16/2024   CO2 25 06/16/2024    Lab Results  Component Value Date   CHOL 161 07/01/2024   HDL 47 07/01/2024   LDLCALC 86 07/01/2024   TRIG 189 (H) 07/01/2024   CHOLHDL 3.4 07/01/2024    Medications Reviewed Today     Reviewed by Marsh, Shantanu Strauch E, RPH (Pharmacist) on 07/18/24 at 1007  Med List Status: <None>   Medication Order Taking? Sig  Documenting Provider Last Dose Status Informant  ARIPiprazole  (ABILIFY ) 15 MG tablet [id number]  Take 1 tablet (15 mg total) by mouth daily. Bacigalupo, Angela M, MD  Active   atorvastatin  (LIPITOR) 20 MG tablet [id number]  Take 1 tablet (20 mg total) by mouth daily. Bacigalupo, Angela M, MD  Active   bacitracin  500 UNIT/GM ointment [id number]  Apply 1 Application topically 2 (two) times daily. Bacigalupo, Angela M, MD  Active   bictegravir-emtricitabine -tenofovir  AF (BIKTARVY ) 50-200-25 MG TABS tablet [id number] Yes Take 1 tablet by mouth daily. Fleeta Rothman, Jomarie SAILOR, MD  Active   cyanocobalamin  (VITAMIN B12) injection 1,000 mcg [id number]   Myrla Jon HERO, MD  Active   cyanocobalamin  (VITAMIN B12) injection 1,000 mcg [id number]   Myrla Jon HERO, MD  Active   eszopiclone  3 MG TABS [id number]  Take 1 tablet (3 mg total) by mouth at bedtime as needed. Take immediately before bedtime Sleet Katheren, MD  Active   metoCLOPramide  (REGLAN ) 5 MG tablet [id number] Yes Take 1 tablet (5 mg total) by mouth 4 (four) times daily -  before meals and at bedtime. Bacigalupo, Angela M, MD  Active   montelukast  (SINGULAIR ) 10 MG tablet [id number] Yes Take 1 tablet (10 mg total) by mouth daily. Myrla Jon HERO, MD  Active   omeprazole  (PRILOSEC) 40 MG capsule [id number]  Take 1 capsule (40 mg total) by mouth in the morning and at bedtime. Unk Corinn Skiff, MD  Active   ondansetron  (ZOFRAN -ODT) 4 MG disintegrating tablet [id number]  Take 1 tablet (4  mg total) by mouth every 8 (eight) hours as needed. Vivienne Nest M, PA-C  Active   potassium citrate  (UROCIT-K ) 10 MEQ (1080 MG) SR tablet [id number]  TAKE 1 TABLET (10 MEQ TOTAL) BY MOUTH 3 (THREE) TIMES DAILY WITH MEALS. Vaillancourt, Samantha, PA-C  Active   sertraline  (ZOLOFT ) 100 MG tablet [id number] Yes Take 1.5 tablets (150 mg total) by mouth at bedtime. Hisada, Reina, MD  Active   SUMAtriptan  (IMITREX ) 100 MG tablet [id number]  TAKE ONE TABLET BY MOUTH AT  ONSET OF THE HEADACHE. MAY REPEAT IN TWO HOURS Bacigalupo, Jon HERO, MD  Active   tadalafil  (CIALIS ) 5 MG tablet [id number]  Take 1 tablet (5 mg total) by mouth daily. Vaillancourt, Samantha, PA-C  Active   tamsulosin  (FLOMAX ) 0.4 MG CAPS capsule [id number]  Take 1 capsule (0.4 mg total) by mouth daily. Go back to 1 tablet daily Vaillancourt, Samantha, PA-C  Active   tiZANidine  (ZANAFLEX ) 4 MG tablet [id number]  TAKE 1 TABLET BY MOUTH 2 TIMES DAILY. Bacigalupo, Angela M, MD  Active   verapamil  (CALAN ) 40 MG tablet [id number]  Take 1 tablet (40 mg total) by mouth 2 (two) times daily. Myrla Jon HERO, MD  Active               Assessment/Plan:   Medication Management: - Currently unable to afford prescription copays. Reports first paycheck should be later this upcoming month. -Will coordinate with [facility] for payment plan for patient. Will request to have all prescriptions transferred at this time.     Follow Up Plan:  PCP: today Pharmacist: prn  Bonham Zingale E. Marsh, PharmD Clinical Pharmacist [facility] [phone]

## 2024-07-19 ENCOUNTER — Other Ambulatory Visit: Payer: Self-pay

## 2024-07-22 ENCOUNTER — Ambulatory Visit: Admitting: Medical

## 2024-07-22 NOTE — Progress Notes (Deleted)
  Cardiology Office Note   Date:  07/22/2024  ID:  Troy [name], DOB 1971-05-02, MRN [id number] PCP: Myrla Jon HERO, MD  [facility] Providers Cardiologist:  None { Click to update primary MD,subspecialty MD or APP then REFRESH:1}    History of Present Illness Troy [name] is a 53 y.o. male with a h/o hyperlipidemia, anxiety, bipolar 1 disorder, HIV disease, gastroparesis, migraine headaches who presents for chest pain.  Today,  ROS: ***  Studies Reviewed      *** Risk Assessment/Calculations {Does this patient have ATRIAL FIBRILLATION?:[phone]} No BP recorded.  {Refresh Note OR Click here to enter BP  :1}***       Physical Exam VS:  There were no vitals taken for this visit.       Wt Readings from Last 3 Encounters:  07/18/24 156 lb 3.2 oz (70.9 kg)  07/01/24 155 lb 3.2 oz (70.4 kg)  06/20/24 155 lb 11.2 oz (70.6 kg)    GEN: Well nourished, well developed in no acute distress NECK: No JVD; No carotid bruits CARDIAC: ***RRR, no murmurs, rubs, gallops RESPIRATORY:  Clear to auscultation without rales, wheezing or rhonchi  ABDOMEN: Soft, non-tender, non-distended EXTREMITIES:  No edema; No deformity   ASSESSMENT AND PLAN ***    {Are you ordering a CV Procedure (e.g. stress test, cath, DCCV, TEE, etc)?   Press F2        :[id number]}  Dispo: ***  Signed, Trinitey Roache VEAR Fishman, PA-C

## 2024-07-24 ENCOUNTER — Other Ambulatory Visit: Payer: Self-pay

## 2024-07-25 ENCOUNTER — Other Ambulatory Visit: Payer: Self-pay

## 2024-07-26 ENCOUNTER — Other Ambulatory Visit: Payer: Self-pay

## 2024-07-26 NOTE — Progress Notes (Signed)
 Specialty Pharmacy Refill Coordination Note  Troy [name] is a 53 y.o. male contacted today regarding refills of specialty medication(s) Bictegravir-Emtricitab-Tenofov (Biktarvy )   Patient requested Delivery   Delivery date: 07/30/24   [address] [address]   Medication will be filled on 07/29/24.

## 2024-07-27 ENCOUNTER — Other Ambulatory Visit (HOSPITAL_COMMUNITY): Payer: Self-pay

## 2024-07-27 ENCOUNTER — Other Ambulatory Visit: Payer: Self-pay

## 2024-07-29 ENCOUNTER — Other Ambulatory Visit (HOSPITAL_COMMUNITY): Payer: Self-pay

## 2024-08-01 ENCOUNTER — Other Ambulatory Visit: Payer: Self-pay

## 2024-08-01 DIAGNOSIS — K5909 Other constipation: Secondary | ICD-10-CM | POA: Diagnosis not present

## 2024-08-01 DIAGNOSIS — K3184 Gastroparesis: Secondary | ICD-10-CM | POA: Diagnosis not present

## 2024-08-01 MED ORDER — LINZESS 145 MCG PO CAPS
145.0000 ug | ORAL_CAPSULE | Freq: Every day | ORAL | 0 refills | Status: AC
  Filled 2024-08-01: qty 30, 30d supply, fill #0

## 2024-08-05 ENCOUNTER — Other Ambulatory Visit: Payer: Self-pay

## 2024-08-05 ENCOUNTER — Ambulatory Visit

## 2024-08-05 VITALS — BP 118/70 | HR 68 | Ht 69.0 in | Wt 158.0 lb

## 2024-08-05 DIAGNOSIS — Z7689 Persons encountering health services in other specified circumstances: Secondary | ICD-10-CM | POA: Diagnosis not present

## 2024-08-05 DIAGNOSIS — R072 Precordial pain: Secondary | ICD-10-CM | POA: Diagnosis not present

## 2024-08-05 DIAGNOSIS — R079 Chest pain, unspecified: Secondary | ICD-10-CM | POA: Diagnosis not present

## 2024-08-05 DIAGNOSIS — E782 Mixed hyperlipidemia: Secondary | ICD-10-CM | POA: Insufficient documentation

## 2024-08-05 MED ORDER — METOPROLOL TARTRATE 50 MG PO TABS
ORAL_TABLET | ORAL | 0 refills | Status: DC
  Filled 2024-08-05: qty 1, 1d supply, fill #0

## 2024-08-05 NOTE — Patient Instructions (Addendum)
 Medication Instructions:  Your physician recommends that you continue on your current medications as directed. Please refer to the Current Medication list given to you today.    *If you need a refill on your cardiac medications before your next appointment, please call your pharmacy*  Lab Work: Your provider would like for you to have following labs drawn today bmet.   If you have labs (blood work) drawn today and your tests are completely normal, you will receive your results only by: MyChart Message (if you have MyChart) OR A paper copy in the mail If you have any lab test that is abnormal or we need to change your treatment, we will call you to review the results.  Testing/Procedures: Your physician has requested that you have an echocardiogram. Echocardiography is a painless test that uses sound waves to create images of your heart. It provides your doctor with information about the size and shape of your heart and how well your heart's chambers and valves are working.   You may receive an ultrasound enhancing agent through an IV if needed to better visualize your heart during the echo. This procedure takes approximately one hour.  There are no restrictions for this procedure.  This will take place at [address] [facility] [address], [place] [address]  Please note: We ask at that you not bring children with you during ultrasound (echo/ vascular) testing. Due to room size and safety concerns, children are not allowed in the ultrasound rooms during exams. Our front office staff cannot provide observation of children in our lobby area while testing is being conducted. An adult accompanying a patient to their appointment will only be allowed in the ultrasound room at the discretion of the ultrasound technician under special circumstances. We apologize for any inconvenience.      Your cardiac CT will be scheduled at one of the below locations:   [facility] [address] [place], [place] [address] [phone]   Please arrive 15 mins early for check-in and test prep.  There is spacious parking and easy access to the radiology department from the [facility] entrance. Please enter here and check-in with the desk attendant.   Please follow these instructions carefully (unless otherwise directed):  An IV will be required for this test and Nitroglycerin will be given.  Hold all erectile dysfunction medications at least 3 days (72 hrs) prior to test. (Ie viagra, cialis , sildenafil, tadalafil , etc)   On the Night Before the Test: Be sure to Drink plenty of water. Do not consume any caffeinated/decaffeinated beverages or chocolate 12 hours prior to your test. Do not take any antihistamines 12 hours prior to your test.   On the Day of the Test: Drink plenty of water until 1 hour prior to the test. Do not eat any food 1 hour prior to test. You may take your regular medications prior to the test.  Take metoprolol (Lopressor) 50 MG by mouth two hours prior to test.       After the Test: Drink plenty of water. After receiving IV contrast, you may experience a mild flushed feeling. This is normal. On occasion, you may experience a mild rash up to 24 hours after the test. This is not dangerous. If this occurs, you can take Benadryl 25 mg, Zyrtec , Claritin, or Allegra and increase your fluid intake. (Patients taking Tikosyn should avoid Benadryl, and may take Zyrtec , Claritin, or Allegra) If you experience trouble breathing, this  can be serious. If it is severe call 911 IMMEDIATELY. If it is mild, please call our office.  We will call to schedule your test 2-4 weeks out understanding that some insurance companies will need an authorization prior to the service being performed.   For more information and frequently asked questions, please visit our website : [email or web address]  For non-scheduling related questions,  please contact the cardiac imaging nurse navigator should you have any questions/concerns: Cardiac Imaging Nurse Navigators Direct Office Dial: [phone]   For scheduling needs, including cancellations and rescheduling, please call [place], [phone].      Follow-Up: At [facility], you and your health needs are our priority.  As part of our continuing mission to provide you with exceptional heart care, our providers are all part of one team.  This team includes your primary Cardiologist (physician) and Advanced Practice Providers or APPs (Physician Assistants and Nurse Practitioners) who all work together to provide you with the care you need, when you need it.  Your next appointment:   As Needed   Provider:  Caron Poser, MD    We recommend signing up for the patient portal called MyChart.  Sign up information is provided on this After Visit Summary.  MyChart is used to connect with patients for Virtual Visits (Telemedicine).  Patients are able to view lab/test results, encounter notes, upcoming appointments, etc.  Non-urgent messages can be sent to your provider as well.   To learn more about what you can do with MyChart, go to [email or web address].

## 2024-08-05 NOTE — Progress Notes (Signed)
  Cardiology Office Note   Date:  08/05/2024  ID:  Troy [name], DOB 02/27/1971, MRN [id number] PCP: Troy Jon HERO, MD  [facility] Providers Cardiologist:  Caron Poser, MD     History of Present Illness Troy [name] is a 53 y.o. male PMH HLD, HIV on ART who presents for further evaluation management of chest discomfort.  Patient reports he is very active.  He said that he had been walking home from the grocery store about a week ago and had a sudden episode of acute onset chest pain and diaphoresis which lasted for about an hour.  He says the chest pain abated quickly with rest, but the diaphoresis persisted.  He denies any presence of this issue before.  He is otherwise extremely active and walks several miles a day and does several flights of stairs without reproduction of the symptoms.  Denies any significant family history of premature ASCVD.  Last LDL 86 06/2024.  Relevant CVD History -None   ROS: Pt denies any chest discomfort, jaw pain, arm pain, palpitations, syncope, presyncope, orthopnea, PND, or LE edema.  Studies Reviewed I have independently reviewed the patient's ECG, previous medical records, and recent blood work.  Physical Exam VS:  BP 118/70 (BP Location: Right Arm, Patient Position: Sitting, Cuff Size: Normal)   Pulse 68   Ht 5' 9 (1.753 m)   Wt 158 lb (71.7 kg)   BMI 23.33 kg/m        Wt Readings from Last 3 Encounters:  08/05/24 158 lb (71.7 kg)  07/18/24 156 lb 3.2 oz (70.9 kg)  07/01/24 155 lb 3.2 oz (70.4 kg)    GEN: No acute distress. NECK: No JVD; No carotid bruits. CARDIAC: RRR, no murmurs, rubs, gallops. RESPIRATORY:  Clear to auscultation. EXTREMITIES:  Warm and well-perfused. No edema.  ASSESSMENT AND PLAN Chest discomfort Patient presents with an isolated episode of chest discomfort.  This occurred as he was walking home from the grocery store and was accompanied by diaphoresis which lasted for about an hour.  He has not had  of any recurrence since.  ECG appears normal.  Would overall say intermediate pretest probability of CAD, so further testing is indicated.  Plan: - Coronary CT angiogram to assess for obstructive CAD - Echocardiogram to assess for structural causes - Further plans pending cardiac testing  HLD Last LDL 86 06/2024.  Continue Lipitor 20 mg daily.  If he has evidence of plaque on his coronary CT angiogram, then we will shoot for an LDL goal of less than 70.        Dispo: RTC as needed based on results of cardiac testing  Signed, Caron Poser, MD

## 2024-08-06 ENCOUNTER — Ambulatory Visit: Payer: Self-pay

## 2024-08-06 LAB — BASIC METABOLIC PANEL WITH GFR
BUN/Creatinine Ratio: 8 — ABNORMAL LOW (ref 9–20)
BUN: 8 mg/dL (ref 6–24)
CO2: 27 mmol/L (ref 20–29)
Calcium: 9.9 mg/dL (ref 8.7–10.2)
Chloride: 103 mmol/L (ref 96–106)
Creatinine, Ser: 1.04 mg/dL (ref 0.76–1.27)
Glucose: 91 mg/dL (ref 70–99)
Potassium: 4.5 mmol/L (ref 3.5–5.2)
Sodium: 142 mmol/L (ref 134–144)
eGFR: 87 mL/min/1.73 (ref >59)

## 2024-08-07 ENCOUNTER — Ambulatory Visit

## 2024-08-12 ENCOUNTER — Ambulatory Visit (INDEPENDENT_AMBULATORY_CARE_PROVIDER_SITE_OTHER)

## 2024-08-12 DIAGNOSIS — D1801 Hemangioma of skin and subcutaneous tissue: Secondary | ICD-10-CM

## 2024-08-12 DIAGNOSIS — L578 Other skin changes due to chronic exposure to nonionizing radiation: Secondary | ICD-10-CM

## 2024-08-12 DIAGNOSIS — D229 Melanocytic nevi, unspecified: Secondary | ICD-10-CM

## 2024-08-12 DIAGNOSIS — L814 Other melanin hyperpigmentation: Secondary | ICD-10-CM

## 2024-08-12 DIAGNOSIS — L821 Other seborrheic keratosis: Secondary | ICD-10-CM | POA: Diagnosis not present

## 2024-08-12 DIAGNOSIS — L820 Inflamed seborrheic keratosis: Secondary | ICD-10-CM | POA: Diagnosis not present

## 2024-08-12 DIAGNOSIS — Z1283 Encounter for screening for malignant neoplasm of skin: Secondary | ICD-10-CM

## 2024-08-12 DIAGNOSIS — W908XXA Exposure to other nonionizing radiation, initial encounter: Secondary | ICD-10-CM | POA: Diagnosis not present

## 2024-08-12 NOTE — Progress Notes (Signed)
    Subjective   Troy [name] is a 53 y.o. male who presents for the following: Lesion(s) of concern . Patient is new patient  Today patient reports: Irregular lesion on the R arm x 6 mths doesn't heal, but not painful.  Review of Systems:    No other skin or systemic complaints except as noted in HPI or Assessment and Plan.  The following portions of the chart were reviewed this encounter and updated as appropriate: medications, allergies, medical history  Relevant Medical History:  Family history of skin cancer - unknown type in father   Objective  Well appearing patient in no apparent distress; mood and affect are within normal limits. Examination was performed of the: Sun Exposed Exam: Scalp, head, eyes, ears, nose, lips, neck, upper extremities, hands, fingers, fingernails  Examination notable for: Angioma(s): Scattered red vascular papule(s)  , Lentigo/lentigines: Scattered pigmented macules that are tan to brown in color and are somewhat non-uniform in shape and concentrated in the sun-exposed areas, Seborrheic Keratosis(es): Stuck-on appearing keratotic papule(s) on the trunk, some  irritated with redness, crusting, edema, and/or partial avulsion, Actinic Damage/Elastosis: chronic sun damage: dyspigmentation, telangiectasia, and wrinkling  Examination limited by: n/a   R forearm x 1 Erythematous stuck-on, waxy papule or plaque  Assessment & Plan       Assessment / Plan:   SKIN CANCER SCREENING PERFORMED TODAY.  BENIGN SKIN FINDINGS  - Lentigines  - Seborrheic keratoses  - Hemangiomas   - Nevus/Multiple Benign Nevi - Reassurance provided regarding the benign appearance of lesions noted on exam today; no treatment is indicated in the absence of symptoms/changes. - Reinforced importance of photoprotective strategies including liberal and frequent sunscreen use of a broad-spectrum SPF 30 or greater, use of protective clothing, and sun avoidance for prevention of cutaneous  malignancy and photoaging.  Counseled patient on the importance of regular self-skin monitoring as well as routine clinical skin examinations as scheduled.   ACTINIC DAMAGE - Chronic condition, secondary to cumulative UV/sun exposure - Recommend daily broad spectrum sunscreen SPF 30+ to sun-exposed areas, reapply every 2 hours as needed.  - Staying in the shade or wearing long sleeves, sun glasses (UVA+UVB protection) and wide brim hats (4-inch brim around the entire circumference of the hat) are also recommended for sun protection.  - Call for new or changing lesions.  Level of service outlined above   Procedures, orders, diagnosis for this visit:  INFLAMED SEBORRHEIC KERATOSIS R forearm x 1 Symptomatic, irritating, patient would like treated.  Destruction of lesion - R forearm x 1 Complexity: simple   Destruction method: cryotherapy   Informed consent: discussed and consent obtained   Timeout:  patient name, date of birth, surgical site, and procedure verified Lesion destroyed using liquid nitrogen: Yes   Region frozen until ice ball extended beyond lesion: Yes   Cryo cycles: 1 or 2. Outcome: patient tolerated procedure well with no complications   Post-procedure details: wound care instructions given     Inflamed seborrheic keratosis -     Destruction of lesion    Return to clinic: Return if symptoms worsen or fail to improve.  LILLETTE Rosina Mayans, CMA, am acting as scribe for Lauraine JAYSON Kanaris, MD .   Documentation: I have reviewed the above documentation for accuracy and completeness, and I agree with the above.  Lauraine JAYSON Kanaris, MD

## 2024-08-12 NOTE — Patient Instructions (Signed)

## 2024-08-13 ENCOUNTER — Other Ambulatory Visit (HOSPITAL_COMMUNITY): Payer: Self-pay

## 2024-08-13 ENCOUNTER — Other Ambulatory Visit: Payer: Self-pay | Admitting: Psychiatry

## 2024-08-13 ENCOUNTER — Other Ambulatory Visit: Payer: Self-pay

## 2024-08-13 ENCOUNTER — Ambulatory Visit

## 2024-08-13 ENCOUNTER — Other Ambulatory Visit: Payer: Self-pay | Admitting: Family Medicine

## 2024-08-13 MED ORDER — BACITRACIN 500 UNIT/GM EX OINT
1.0000 | TOPICAL_OINTMENT | Freq: Two times a day (BID) | CUTANEOUS | 0 refills | Status: AC
  Filled 2024-08-13: qty 28, 30d supply, fill #0

## 2024-08-13 MED ORDER — ESZOPICLONE 3 MG PO TABS
3.0000 mg | ORAL_TABLET | Freq: Every evening | ORAL | 1 refills | Status: DC | PRN
  Filled 2024-08-13 – 2024-08-21 (×4): qty 30, 30d supply, fill #0
  Filled 2024-10-02: qty 30, 30d supply, fill #1

## 2024-08-13 MED FILL — Tamsulosin HCl Cap 0.4 MG: ORAL | 30 days supply | Qty: 30 | Fill #1 | Status: AC

## 2024-08-14 ENCOUNTER — Other Ambulatory Visit: Payer: Self-pay

## 2024-08-17 ENCOUNTER — Other Ambulatory Visit: Payer: Self-pay

## 2024-08-19 ENCOUNTER — Other Ambulatory Visit: Payer: Self-pay

## 2024-08-21 ENCOUNTER — Other Ambulatory Visit: Payer: Self-pay

## 2024-08-21 ENCOUNTER — Other Ambulatory Visit (HOSPITAL_COMMUNITY): Payer: Self-pay

## 2024-08-22 ENCOUNTER — Encounter (HOSPITAL_COMMUNITY): Payer: Self-pay

## 2024-08-23 ENCOUNTER — Telehealth (HOSPITAL_COMMUNITY): Payer: Self-pay | Admitting: Emergency Medicine

## 2024-08-23 ENCOUNTER — Other Ambulatory Visit: Payer: Self-pay

## 2024-08-23 ENCOUNTER — Other Ambulatory Visit (HOSPITAL_COMMUNITY): Payer: Self-pay

## 2024-08-23 NOTE — Telephone Encounter (Signed)
 Reaching out to patient to offer assistance regarding upcoming cardiac imaging study; pt verbalizes understanding of appt date/time, parking situation and where to check in, pre-test NPO status and medications ordered, and verified current allergies; name and call back number provided for further questions should they arise Rockwell Alexandria RN Navigator Cardiac Imaging [facility] [phone] office [phone] cell

## 2024-08-23 NOTE — Progress Notes (Signed)
 Specialty Pharmacy Refill Coordination Note  Troy [name] is a 53 y.o. male contacted today regarding refills of specialty medication(s) Bictegravir-Emtricitab-Tenofov (Biktarvy )   Patient requested Delivery   Delivery date: 09/04/24   Verified address: [address] [name] [address]   Medication will be filled on: 09/03/24
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
## 2024-08-26 ENCOUNTER — Ambulatory Visit

## 2024-08-27 ENCOUNTER — Other Ambulatory Visit: Payer: Self-pay

## 2024-08-27 ENCOUNTER — Encounter: Payer: Self-pay | Admitting: Psychiatry

## 2024-08-27 ENCOUNTER — Other Ambulatory Visit (HOSPITAL_COMMUNITY): Payer: Self-pay | Admitting: *Deleted

## 2024-08-27 ENCOUNTER — Ambulatory Visit (INDEPENDENT_AMBULATORY_CARE_PROVIDER_SITE_OTHER): Admitting: Psychiatry

## 2024-08-27 VITALS — BP 111/79 | HR 71 | Temp 97.5°F | Ht 69.0 in | Wt 162.0 lb

## 2024-08-27 DIAGNOSIS — F3131 Bipolar disorder, current episode depressed, mild: Secondary | ICD-10-CM

## 2024-08-27 DIAGNOSIS — G4700 Insomnia, unspecified: Secondary | ICD-10-CM

## 2024-08-27 DIAGNOSIS — F509 Eating disorder, unspecified: Secondary | ICD-10-CM | POA: Diagnosis not present

## 2024-08-27 DIAGNOSIS — F4310 Post-traumatic stress disorder, unspecified: Secondary | ICD-10-CM

## 2024-08-27 MED ORDER — METOPROLOL TARTRATE 50 MG PO TABS
ORAL_TABLET | ORAL | 0 refills | Status: AC
  Filled 2024-08-27: qty 1, 1d supply, fill #0

## 2024-08-27 NOTE — Patient Instructions (Addendum)
 Continue Abilify  15 mg daily Continue sertraline  150 mg at night  Continue Lunesta  3 mg at night as needed for sleep  Next appointment: 12/18 at 3 pm

## 2024-08-29 ENCOUNTER — Other Ambulatory Visit: Payer: Self-pay

## 2024-08-29 ENCOUNTER — Telehealth: Payer: Self-pay

## 2024-08-29 DIAGNOSIS — N401 Enlarged prostate with lower urinary tract symptoms: Secondary | ICD-10-CM

## 2024-08-29 MED ORDER — SILODOSIN 8 MG PO CAPS
8.0000 mg | ORAL_CAPSULE | Freq: Every day | ORAL | 11 refills | Status: DC
  Filled 2024-08-29 – 2024-10-27 (×7): qty 30, 30d supply, fill #0

## 2024-08-29 NOTE — Telephone Encounter (Signed)
 Medication refil - Called patient back, after he left a message that Dr. Vickey could call in the medication they had discussed 08/27/24 for sleep, as his neurologist changed a medication she was concerned about.  Patient stated it should now be fine for him to go back on Prazosin  if she would send this into is Probation Officer at [facility].  Agreed to send the request to Dr. Vickey.

## 2024-08-29 NOTE — Telephone Encounter (Signed)
 Medication management - Called patient back to inform of concerns for his newly prescribed silodosin to be prescribed with prazosin  due to the propencity for those together to lower his blood pressure too much. Patient stated he discussed this with the PAs office that prescribed the silodosin and they reported it was less likely than the other medication he was taken to lower his blood pressure too much.  Patient wanted to know if Dr. Vickey would reconsider or if she can prescribe something else that may help with his dreams.  Agreed to send request back to provider.

## 2024-08-29 NOTE — Telephone Encounter (Signed)
 I see that he was prescribed silodosin, which can lower blood pressure. Please advise him that I cannot prescribe prazosin , as the two medications can interact and further increase the risk of low blood pressure.

## 2024-08-29 NOTE — Telephone Encounter (Signed)
 I would not recommend it because of the possible side effect I mentioned. Medications used for nightmares can lower blood pressure. If the urologist has treatment options that do not interact with prazosin  or clonidine, those could certainly be considered.

## 2024-09-02 NOTE — Telephone Encounter (Signed)
 Please advise him that unfortunately, due to concerns about the potential side effect of lowering blood pressure, I won't be able to prescribe that medication.

## 2024-09-02 NOTE — Telephone Encounter (Signed)
 Called patient to inform of message from provider he stated that he thinks that all the prostate medications has a side effect of lowering your blood pressure. Because of his nightmares he was wanting the Prazosin  please advise

## 2024-09-02 NOTE — Telephone Encounter (Signed)
 Called patient to inform of message from provider, patient voiced understanding

## 2024-09-03 ENCOUNTER — Telehealth: Payer: Self-pay

## 2024-09-03 ENCOUNTER — Other Ambulatory Visit: Payer: Self-pay

## 2024-09-03 NOTE — Telephone Encounter (Signed)
 Prior authorization has been sent via CoverMyMeds. Awaiting approval.

## 2024-09-04 ENCOUNTER — Other Ambulatory Visit: Payer: Self-pay

## 2024-09-04 ENCOUNTER — Ambulatory Visit

## 2024-09-05 ENCOUNTER — Other Ambulatory Visit: Payer: Self-pay

## 2024-09-09 ENCOUNTER — Encounter (HOSPITAL_COMMUNITY): Payer: Self-pay

## 2024-09-11 ENCOUNTER — Telehealth (HOSPITAL_COMMUNITY): Payer: Self-pay | Admitting: *Deleted

## 2024-09-11 NOTE — Telephone Encounter (Signed)
 Reaching out to patient to offer assistance regarding upcoming cardiac imaging study; pt verbalizes understanding of appt date/time, parking situation and where to check in, pre-test NPO status and medications ordered, and verified current allergies; name and call back number provided for further questions should they arise Sid Seats RN Navigator Cardiac Imaging [facility] [phone] office [phone] cell

## 2024-09-12 ENCOUNTER — Ambulatory Visit
Admission: RE | Admit: 2024-09-12 | Discharge: 2024-09-12 | Disposition: A | Discharge status: Home / Self Care | Source: Ambulatory Visit

## 2024-09-12 DIAGNOSIS — R072 Precordial pain: Secondary | ICD-10-CM | POA: Diagnosis not present

## 2024-09-12 MED ORDER — METOPROLOL TARTRATE 5 MG/5ML IV SOLN: 10.0000 mg | Freq: Once | INTRAVENOUS | Status: DC | PRN

## 2024-09-12 MED ORDER — DILTIAZEM HCL 25 MG/5ML IV SOLN: 10.0000 mg | INTRAVENOUS | Status: DC | PRN

## 2024-09-12 MED ORDER — IOHEXOL 350 MG/ML SOLN
100.0000 mL | Freq: Once | INTRAVENOUS | Status: AC | PRN
Start: 2024-09-12 — End: 2024-09-12
  Administered 2024-09-12: 100 mL via INTRAVENOUS

## 2024-09-12 MED ORDER — NITROGLYCERIN 0.4 MG SL SUBL
0.8000 mg | SUBLINGUAL_TABLET | Freq: Once | SUBLINGUAL | Status: AC
  Administered 2024-09-12: 0.8 mg via SUBLINGUAL

## 2024-09-12 NOTE — Progress Notes (Signed)
 Patient tolerated CT well. Vital signs stable encourage to drink water throughout day.Reasons explained and verbalized understanding. Ambulated steady gait.

## 2024-09-17 ENCOUNTER — Ambulatory Visit

## 2024-09-17 NOTE — Congregational Nurse Program (Signed)
  Dept: [phone]   Congregational Nurse Program Note  Date of Encounter: 09/17/2024 Client to [facility], nurse led clinic, reporting not feeling well. Client had difficulty describing any symptoms he had. He reports not taking his medications this weekend and he had left them in the car that he sleeps in and was at his sisters house for the weekend. He denied pain, no congestion, or sore throat. He appeared somewhat lethargic, but was able to engage and answer questions. RN encouraged client to take his prescribed medications. He was able to eat breakfast at the center and reported feeling some better. Rn to continue to provide support and assistance as able. MARLA Marina BSN, RN Past Medical History: Past Medical History:  Diagnosis Date   Alcohol use disorder 12/11/2023   Asthma    Bipolar 1 disorder (HCC)    BPH (benign prostatic hyperplasia) 12/11/2023   Diabetes mellitus without complication (HCC)    controlled by weight loss   GERD (gastroesophageal reflux disease)    History of CVA with residual deficit    HIV infection (HCC)    Hyperlipidemia 08/22/2022   Migraine headache    2-3 X/month   Nephrolithiasis    PTSD (post-traumatic stress disorder) 12/11/2023   Wears dentures    full upper and lower    Encounter Details:

## 2024-09-24 ENCOUNTER — Other Ambulatory Visit: Payer: Self-pay

## 2024-09-25 NOTE — Congregational Nurse Program (Signed)
  Dept: [phone]   Congregational Nurse Program Note  Date of Encounter: 09/25/2024 Client to [facility], nurse led clinic, with complaints of a pulled groin muscle from jogging. Client given a warm pack to place on the area, with plan for reassessment tomorrow if he returns to clinic. No other needs at this time. MARLA Marina BSN, RN Past Medical History: Past Medical History:  Diagnosis Date   Alcohol use disorder 12/11/2023   Asthma    Bipolar 1 disorder (HCC)    BPH (benign prostatic hyperplasia) 12/11/2023   Diabetes mellitus without complication (HCC)    controlled by weight loss   GERD (gastroesophageal reflux disease)    History of CVA with residual deficit    HIV infection (HCC)    Hyperlipidemia 08/22/2022   Migraine headache    2-3 X/month   Nephrolithiasis    PTSD (post-traumatic stress disorder) 12/11/2023   Wears dentures    full upper and lower    Encounter Details:  Community Questionnaire - 09/25/24 1145       Questionnaire   Ask client: Do you give verbal consent for me to treat you today? Yes    Student Assistance N/A    Location Patient Served  [facility]    Encounter Setting CN site    Population Status Unhoused    [place]    Insurance/Financial Assistance Referral N/A    Medication N/A    Medical Provider Yes    Screening Referrals Made N/A    Medical Referrals Made N/A    Medical Appointment Completed N/A    CNP Interventions Advocate/Support;Educate    Screenings CN Performed N/A    ED Visit Averted N/A    Life-Saving Intervention Made N/A

## 2024-09-26 ENCOUNTER — Other Ambulatory Visit: Payer: Self-pay

## 2024-09-28 ENCOUNTER — Other Ambulatory Visit: Payer: Self-pay

## 2024-09-30 ENCOUNTER — Other Ambulatory Visit: Payer: Self-pay

## 2024-10-01 ENCOUNTER — Telehealth: Payer: Self-pay

## 2024-10-01 ENCOUNTER — Other Ambulatory Visit: Payer: Self-pay

## 2024-10-01 NOTE — Telephone Encounter (Signed)
 error

## 2024-10-02 ENCOUNTER — Ambulatory Visit

## 2024-10-02 ENCOUNTER — Other Ambulatory Visit: Payer: Self-pay

## 2024-10-04 DIAGNOSIS — Z419 Encounter for procedure for purposes other than remedying health state, unspecified: Secondary | ICD-10-CM | POA: Diagnosis not present
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
## 2024-10-10 ENCOUNTER — Other Ambulatory Visit: Payer: Self-pay

## 2024-10-10 ENCOUNTER — Encounter: Payer: Self-pay | Admitting: Psychiatry

## 2024-10-10 ENCOUNTER — Ambulatory Visit: Admitting: Psychiatry

## 2024-10-10 VITALS — BP 135/90 | HR 88 | Temp 97.8°F | Ht 69.0 in | Wt 169.8 lb

## 2024-10-10 DIAGNOSIS — F3131 Bipolar disorder, current episode depressed, mild: Secondary | ICD-10-CM

## 2024-10-10 DIAGNOSIS — G4700 Insomnia, unspecified: Secondary | ICD-10-CM | POA: Diagnosis not present

## 2024-10-10 DIAGNOSIS — F4310 Post-traumatic stress disorder, unspecified: Secondary | ICD-10-CM

## 2024-10-10 MED ORDER — ARIPIPRAZOLE 15 MG PO TABS
15.0000 mg | ORAL_TABLET | Freq: Every day | ORAL | 0 refills | Status: AC
  Filled 2024-10-31: qty 60, 60d supply, fill #0
  Filled 2024-10-31: qty 90, 90d supply, fill #0

## 2024-10-10 MED ORDER — ESZOPICLONE 3 MG PO TABS
3.0000 mg | ORAL_TABLET | Freq: Every evening | ORAL | 1 refills | Status: AC | PRN
  Filled 2024-10-10 – 2024-11-26 (×2): qty 30, 30d supply, fill #0

## 2024-10-10 NOTE — Patient Instructions (Signed)
 Continue Abilify  15 mg daily  Continue sertraline  150 mg at night  Continue Lunesta  3 mg at night as needed for sleep  Obtain lab- (vitamin B 1, folic acid, ferritin) Next appointment: 2/10 at 11 am

## 2024-10-10 NOTE — Congregational Nurse Program (Signed)
°  Dept: [phone]   Congregational Nurse Program Note  Date of Encounter: 10/09/2024 Late entry: Client to [facility], nurse led clinic, with request for foot care. Nails trimmed and feet moisturized. Small open area from an old blister. Area cleaned and covered with a nonadhesive bandage. No other needs at this time. MARLA Marina BSN, RN Past Medical History: Past Medical History:  Diagnosis Date   Alcohol use disorder 12/11/2023   Asthma    Bipolar 1 disorder (HCC)    BPH (benign prostatic hyperplasia) 12/11/2023   Diabetes mellitus without complication (HCC)    controlled by weight loss   GERD (gastroesophageal reflux disease)    History of CVA with residual deficit    HIV infection (HCC)    Hyperlipidemia 08/22/2022   Migraine headache    2-3 X/month   Nephrolithiasis    PTSD (post-traumatic stress disorder) 12/11/2023   Wears dentures    full upper and lower    Encounter Details:  Community Questionnaire - 10/09/24 0930       Questionnaire   Ask client: Do you give verbal consent for me to treat you today? Yes    Student Assistance N/A    Location Patient Served  [facility]    Encounter Setting CN site    Population Status Unhoused    [place]    Insurance/Financial Assistance Referral N/A    Medication N/A    Medical Provider Yes    Screening Referrals Made N/A    Medical Referrals Made N/A    Medical Appointment Completed N/A    CNP Interventions Advocate/Support;Educate    Screenings CN Performed N/A    ED Visit Averted N/A    Life-Saving Intervention Made N/A

## 2024-10-11 ENCOUNTER — Other Ambulatory Visit (HOSPITAL_COMMUNITY): Payer: Self-pay

## 2024-10-13 ENCOUNTER — Encounter: Payer: Self-pay | Admitting: Family Medicine

## 2024-10-14 ENCOUNTER — Other Ambulatory Visit (HOSPITAL_COMMUNITY): Payer: Self-pay

## 2024-10-16 ENCOUNTER — Other Ambulatory Visit: Payer: Self-pay

## 2024-10-18 ENCOUNTER — Ambulatory Visit

## 2024-10-18 DIAGNOSIS — R079 Chest pain, unspecified: Secondary | ICD-10-CM | POA: Insufficient documentation

## 2024-10-18 DIAGNOSIS — Z7689 Persons encountering health services in other specified circumstances: Secondary | ICD-10-CM | POA: Diagnosis not present

## 2024-10-18 LAB — ECHOCARDIOGRAM COMPLETE
AR max vel: 2.53 cm2
AV Area VTI: 2.39 cm2
AV Area mean vel: 2.25 cm2
AV Mean grad: 4 mmHg
AV Peak grad: 6.6 mmHg
Ao pk vel: 1.28 m/s
Area-P 1/2: 4.57 cm2
S' Lateral: 3 cm

## 2024-10-21 NOTE — Telephone Encounter (Signed)
 Agree. Needs appt to go over paperwork together and ensure documentation is accurate. Should bring the paperwork to the appt.

## 2024-10-25 ENCOUNTER — Other Ambulatory Visit: Payer: Self-pay

## 2024-10-26 ENCOUNTER — Other Ambulatory Visit: Payer: Self-pay

## 2024-10-27 ENCOUNTER — Other Ambulatory Visit: Payer: Self-pay

## 2024-10-27 ENCOUNTER — Other Ambulatory Visit: Payer: Self-pay | Admitting: Gastroenterology

## 2024-10-27 DIAGNOSIS — R112 Nausea with vomiting, unspecified: Secondary | ICD-10-CM

## 2024-10-27 DIAGNOSIS — K449 Diaphragmatic hernia without obstruction or gangrene: Secondary | ICD-10-CM

## 2024-10-27 DIAGNOSIS — R634 Abnormal weight loss: Secondary | ICD-10-CM

## 2024-10-28 ENCOUNTER — Other Ambulatory Visit: Payer: Self-pay

## 2024-10-28 NOTE — Progress Notes (Signed)
 Specialty Pharmacy Refill Coordination Note  Troy [name] is a 54 y.o. male contacted today regarding refills of specialty medication(s) Bictegravir-Emtricitab-Tenofov (Biktarvy )   Patient requested Delivery   Delivery date: 10/29/24   Verified address: [address] [place] [address] **patient confirmed this address**  Medication will be filled on: 10/28/24

## 2024-10-29 ENCOUNTER — Ambulatory Visit (INDEPENDENT_AMBULATORY_CARE_PROVIDER_SITE_OTHER): Admitting: Family Medicine

## 2024-10-29 ENCOUNTER — Encounter: Payer: Self-pay | Admitting: Family Medicine

## 2024-10-29 VITALS — BP 130/80 | HR 93 | Resp 14 | Ht 68.0 in | Wt 174.5 lb

## 2024-10-29 DIAGNOSIS — K5090 Crohn's disease, unspecified, without complications: Secondary | ICD-10-CM | POA: Diagnosis not present

## 2024-10-29 DIAGNOSIS — Z8673 Personal history of transient ischemic attack (TIA), and cerebral infarction without residual deficits: Secondary | ICD-10-CM | POA: Diagnosis not present

## 2024-10-29 DIAGNOSIS — G43709 Chronic migraine without aura, not intractable, without status migrainosus: Secondary | ICD-10-CM

## 2024-10-29 DIAGNOSIS — K3184 Gastroparesis: Secondary | ICD-10-CM

## 2024-10-29 DIAGNOSIS — B20 Human immunodeficiency virus [HIV] disease: Secondary | ICD-10-CM

## 2024-10-29 DIAGNOSIS — F319 Bipolar disorder, unspecified: Secondary | ICD-10-CM | POA: Diagnosis not present

## 2024-10-29 DIAGNOSIS — J4520 Mild intermittent asthma, uncomplicated: Secondary | ICD-10-CM | POA: Diagnosis not present

## 2024-10-29 DIAGNOSIS — E785 Hyperlipidemia, unspecified: Secondary | ICD-10-CM

## 2024-10-29 NOTE — Progress Notes (Signed)
 "     Established patient visit   Patient: Troy [name]   DOB: 02/20/1971   54 y.o. Male  MRN: [id number] Visit Date: 10/29/2024  Today's healthcare provider: Jon Eva, MD   Chief Complaint  Patient presents with   Medical Management of Chronic Issues    3 month f/u    Form Completion   Subjective    HPI HPI     Medical Management of Chronic Issues    Additional comments: 3 month f/u       Last edited by Wilfred Hargis RAMAN, CMA on 10/29/2024  8:25 AM.       Discussed the use of AI scribe software for clinical note transcription with the patient, who gave verbal consent to proceed.  History of Present Illness   Troy [name] is a 54 year old male who presents for disability paperwork and medication management related to gastroparesis.  He has frequent gastroparesis flares with vomiting and upper abdominal pain once or twice daily, lasting for weeks at a time. Symptoms limit concentration at work for about a quarter of the workday and cause about 10 missed workdays per month. Pain worsens with lifting more than 20 pounds and prolonged standing or walking, which he can no longer tolerate. He takes Reglan  for symptom control.  He has bipolar disorder managed with Abilify , which he can now access reliably under the care of psychiatrist Dr. Vickey.  He has migraines treated with low-dose verapamil , which effectively controls his headaches.  He also has asthma, Crohn's disease, HIV, hyperlipidemia, prior bariatric surgery, and prior stroke. He does not drive despite being physically capable. He reports poor balance and sometimes stoops due to abdominal pain, but he does not need assistive devices and has no difficulty with sitting, foot controls, or hand use.         Medications: Show/hide medication list[1]  Review of Systems     Objective    BP 130/80   Pulse 93   Resp 14   Ht 5' 8 (1.727 m)   Wt 174 lb 8 oz (79.2 kg)   SpO2 100%   BMI 26.53 kg/m     Physical Exam Vitals reviewed.  Constitutional:      General: He is not in acute distress.    Appearance: Normal appearance. He is not diaphoretic.  HENT:     Head: Normocephalic and atraumatic.  Eyes:     General: No scleral icterus.    Conjunctiva/sclera: Conjunctivae normal.  Cardiovascular:     Rate and Rhythm: Normal rate and regular rhythm.     Heart sounds: Normal heart sounds. No murmur heard. Pulmonary:     Effort: Pulmonary effort is normal. No respiratory distress.     Breath sounds: Normal breath sounds. No wheezing or rhonchi.  Abdominal:     General: There is no distension.     Palpations: Abdomen is soft.     Tenderness: There is no abdominal tenderness.  Musculoskeletal:     Cervical back: Neck supple.     Right lower leg: No edema.     Left lower leg: No edema.  Lymphadenopathy:     Cervical: No cervical adenopathy.  Skin:    General: Skin is warm and dry.     Findings: No rash.  Neurological:     Mental Status: He is alert and oriented to person, place, and time. Mental status is at baseline.  Psychiatric:        Mood and Affect: Mood  normal.        Behavior: Behavior normal.      No results found for any visits on 10/29/24.  Assessment & Plan     Problem List Items Addressed This Visit       Cardiovascular and Mediastinum   Migraine headache     Respiratory   Asthma     Digestive   Crohn's disease without complication (HCC)   Gastroparesis - Primary     Other   HIV disease (HCC)   History of CVA in adulthood   Bipolar 1 disorder (HCC)   Hyperlipidemia        Gastroparesis Chronic gastroparesis with episodes of vomiting and abdominal pain, occurring once or twice daily, lasting several hours, significantly impacting daily activities and work capacity. Disability paperwork is being completed, with gastroenterology evaluation anticipated for further assessment. - Continue Reglan  - Complete disability paperwork for  gastroparesis - Follow up with gastroenterology next month  Migraine Migraines are well-managed with verapamil  at a lower dose, effectively controlling symptoms without causing bradycardia.  Asthma Exacerbated by extreme cold and heat. - Avoid extreme cold and heat, avoid pulmonary irritants  Crohn's disease Noted in the list of diagnoses for disability paperwork. F/b GI  HIV infection Noted in the list of diagnoses for disability paperwork. F/b ID - continue HAART  Bipolar disorder Well-managed with Abilify , under the care of psychiatrist Dr. Vickey. - Continue Abilify  - Continue follow-up with psychiatrist Dr. Vickey  Hyperlipidemia Noted in the list of diagnoses for disability paperwork.  History of bariatric surgery Noted in the list of diagnoses for disability paperwork.  History of stroke Noted in the list of diagnoses for disability paperwork.  General Health Maintenance Blood pressure is slightly elevated at 130/80 mmHg, improved from previous readings. Physical examination is due in July. - Scheduled physical examination for July       Return in about 6 months (around 04/28/2025) for CPE.       Jon Eva, MD  [facility] [phone] (phone) [phone] (fax)  [facility]      [1]  Outpatient Medications Prior to Visit  Medication Sig   ARIPiprazole  (ABILIFY ) 15 MG tablet Take 1 tablet (15 mg total) by mouth daily.   atorvastatin  (LIPITOR) 20 MG tablet Take 1 tablet (20 mg total) by mouth daily.   bacitracin  500 UNIT/GM ointment Apply 1 Application topically 2 (two) times daily.   bictegravir-emtricitabine -tenofovir  AF (BIKTARVY ) 50-200-25 MG TABS tablet Take 1 tablet by mouth daily.   cetirizine  (ZYRTEC ) 10 MG tablet Take 1 tablet (10 mg total) by mouth daily.   eszopiclone  3 MG TABS Take 1 tablet (3 mg total) by mouth at bedtime as needed. Take immediately before bedtime   [START ON 11/01/2024]  Eszopiclone  3 MG TABS Take 1 tablet (3 mg total) by mouth at bedtime as needed.   famotidine  (PEPCID ) 20 MG tablet Take 1 tablet (20 mg total) by mouth 2 (two) times daily for 10 days   linaclotide  (LINZESS ) 145 MCG CAPS capsule Take 1 capsule (145 mcg total) by mouth daily.   metoCLOPramide  (REGLAN ) 5 MG tablet Take 1 tablet (5 mg total) by mouth 4 (four) times daily -  before meals and at bedtime.   metoprolol  tartrate (LOPRESSOR ) 50 MG tablet TAKE 1 TABLET 2 HR PRIOR TO CARDIAC PROCEDURE   montelukast  (SINGULAIR ) 10 MG tablet Take 1 tablet (10 mg total) by mouth daily.   naproxen  (NAPROSYN ) 375 MG tablet Take 1 tablet (  375 mg total) by mouth 2 (two) times daily with a meal.   omeprazole  (PRILOSEC) 40 MG capsule Take 1 capsule (40 mg total) by mouth in the morning and at bedtime.   ondansetron  (ZOFRAN -ODT) 4 MG disintegrating tablet Take 1 tablet (4 mg total) by mouth every 8 (eight) hours as needed.   potassium citrate  (UROCIT-K ) 10 MEQ (1080 MG) SR tablet Take 1 tablet (10 mEq total) by mouth 3 (three) times daily with meals.   sertraline  (ZOLOFT ) 100 MG tablet Take 1.5 tablets (150 mg total) by mouth at bedtime.   silodosin  (RAPAFLO ) 8 MG CAPS capsule Take 1 capsule (8 mg total) by mouth daily with breakfast.   SUMAtriptan  (IMITREX ) 100 MG tablet TAKE ONE TABLET BY MOUTH AT ONSET OF THE HEADACHE. MAY REPEAT IN TWO HOURS   tadalafil  (CIALIS ) 5 MG tablet Take 1 tablet (5 mg total) by mouth daily.   tiZANidine  (ZANAFLEX ) 4 MG tablet TAKE 1 TABLET BY MOUTH 2 TIMES DAILY.   verapamil  (CALAN ) 40 MG tablet Take 1 tablet (40 mg total) by mouth 2 (two) times daily.   verapamil  (CALAN -SR) 120 MG CR tablet Take 1 tablet (120 mg total) by mouth at bedtime.   Facility-Administered Medications Prior to Visit  Medication Dose Route Frequency Provider   cyanocobalamin  (VITAMIN B12) injection 1,000 mcg  1,000 mcg Intramuscular Once Johney Perotti M, MD   cyanocobalamin  (VITAMIN B12) injection 1,000 mcg   1,000 mcg Intramuscular Q30 days Myrla Jon HERO, MD   "

## 2024-10-30 ENCOUNTER — Ambulatory Visit

## 2024-10-30 ENCOUNTER — Telehealth: Payer: Self-pay | Admitting: Physician Assistant

## 2024-10-30 ENCOUNTER — Other Ambulatory Visit (HOSPITAL_COMMUNITY): Payer: Self-pay

## 2024-10-30 DIAGNOSIS — N401 Enlarged prostate with lower urinary tract symptoms: Secondary | ICD-10-CM

## 2024-10-30 MED ORDER — TAMSULOSIN HCL 0.4 MG PO CAPS
0.4000 mg | ORAL_CAPSULE | Freq: Every day | ORAL | 11 refills | Status: AC
  Filled 2024-10-30 – 2024-11-26 (×2): qty 30, 30d supply, fill #0

## 2024-10-30 NOTE — Telephone Encounter (Signed)
 Patient called and stated that he would like to stay on Tamsulosin  instead of switching to Silodosin . He is requesting refill for Tamsulosin  be sent to [facility].

## 2024-10-30 NOTE — Telephone Encounter (Signed)
 Yes, that is fine.

## 2024-10-31 ENCOUNTER — Other Ambulatory Visit: Payer: Self-pay

## 2024-10-31 ENCOUNTER — Telehealth: Payer: Self-pay

## 2024-10-31 ENCOUNTER — Telehealth: Payer: Self-pay | Admitting: Psychiatry

## 2024-10-31 NOTE — Telephone Encounter (Signed)
 Received voicemail from [employer] requesting status update for disability questionnaire.   Called back, notified them our office has not received anything. They had incorrect fax number. Provided them with triage fax and gave them contact info for [facility] for medical records request.   [employer]: [phone] ext. [phone]  Brookelyn Gaynor, BSN, [employer]

## 2024-10-31 NOTE — Telephone Encounter (Signed)
 Please give him a heads-up that while Ill review the paperwork to see how I can support him, I may need to see him first to discuss things further as we have not discussed about this paperwork before.

## 2024-10-31 NOTE — Telephone Encounter (Signed)
 Patient called stating he needs disability paperwork filled out and will bring by on Monday, Jan. 12, 2026. But wanted you to be aware

## 2024-11-04 ENCOUNTER — Other Ambulatory Visit: Payer: Self-pay

## 2024-11-04 ENCOUNTER — Ambulatory Visit: Admitting: Infectious Disease

## 2024-11-05 ENCOUNTER — Other Ambulatory Visit: Payer: Self-pay

## 2024-11-06 ENCOUNTER — Ambulatory Visit: Payer: Self-pay

## 2024-11-06 NOTE — Telephone Encounter (Signed)
 Weakness and dizziness x 2 days, vitals within normal limits per nurse Darice at the day center. ED advised  FYI Only or Action Required?: FYI only for provider: ED advised.  Patient was last seen in primary care on 10/29/2024 by Myrla Jon HERO, MD.  Called Nurse Triage reporting Fatigue and Dizziness.  Symptoms began several days ago.  Interventions attempted: Nothing.  Symptoms are: gradually worsening.  Triage Disposition: Go to ED Now (or PCP Triage)  Patient/caregiver understands and will follow disposition?: Yes  Copied from CRM [id number]. Topic: Clinical - Red Word Triage >> Nov 06, 2024  9:17 AM Chasity T wrote: [facility] that prompted transfer to Nurse Triage: weakness, dizziness Reason for Disposition  SEVERE dizziness (e.g., unable to stand, requires support to walk, feels like passing out now)  Answer Assessment - Initial Assessment Questions 1. DESCRIPTION: Describe your dizziness.     So weak I can't stand   2. LIGHTHEADED: Do you feel lightheaded? (e.g., somewhat faint, woozy, weak upon standing)     Yes  3. VERTIGO: Do you feel like either you or the room is spinning or tilting? (i.e., vertigo)     Yes  4. SEVERITY: How bad is it?  Do you feel like you are going to faint? Can you stand and walk?     Pt states feels like I'm going to hit the floor if I stand   5. ONSET:  When did the dizziness begin?     2 days  6. AGGRAVATING FACTORS: Does anything make it worse? (e.g., standing, change in head position)     Standing, moving head  7. HEART RATE: Can you tell me your heart rate? How many beats in 15 seconds?  (Note: Not all patients can do this.)       HR 78, BP 112/70, BS 137 after eating per nurse Darice at day center  8. CAUSE: What do you think is causing the dizziness? (e.g., decreased fluids or food, diarrhea, emotional distress, heat exposure, new medicine, sudden standing, vomiting; unknown)     Pt unsure, pt is eating  and drinking normally  9. RECURRENT SYMPTOM: Have you had dizziness before? If Yes, ask: When was the last time? What happened that time?     Pt denies  10. OTHER SYMPTOMS: Do you have any other symptoms? (e.g., fever, chest pain, vomiting, diarrhea, bleeding)       Denies  Protocols used: Dizziness - Lightheadedness-A-AH

## 2024-11-06 NOTE — Congregational Nurse Program (Signed)
" °  Dept: [phone]   Congregational Nurse Program Note  Date of Encounter: 11/06/2024 Client to [facility] reporting he was feeling dizzy and weak. BP 112/70 (BP Location: Left Arm, Patient Position: Sitting, Cuff Size: Normal)   Pulse 78   SpO2 97% . Nonfasting glucose 137. RN encouraged client to contact his PCP office, which he did while in this visit. Client was able to speak with a triage RN and she recommended that client be seen at an urgent care of ER. Client was agreeable to this. Support provided. MARLA Marina BSN, RN  Past Medical History: Past Medical History:  Diagnosis Date   Alcohol use disorder 12/11/2023   Asthma    Bipolar 1 disorder (HCC)    BPH (benign prostatic hyperplasia) 12/11/2023   Diabetes mellitus without complication (HCC)    controlled by weight loss   GERD (gastroesophageal reflux disease)    History of CVA with residual deficit    HIV infection (HCC)    Hyperlipidemia 08/22/2022   Migraine headache    2-3 X/month   Nephrolithiasis    PTSD (post-traumatic stress disorder) 12/11/2023   Wears dentures    full upper and lower    Encounter Details:  Community Questionnaire - 11/06/24 0930       Questionnaire   Ask client: Do you give verbal consent for me to treat you today? Yes    Student Assistance N/A    Location Patient Served  [facility]    Encounter Setting CN site    Population Status Unhoused    [place]urance [place]    Insurance/Financial Assistance Referral N/A    Medication N/A    Medical Provider Yes    Medical Referrals Made [facility] PCP/Clinic    Medical Appointment Completed N/A    Screenings CN Performed (remember to also record results) Blood Pressure    CNP Interventions Advocate/Support    ED Visit Averted N/A            "

## 2024-11-06 NOTE — Telephone Encounter (Signed)
 Noted

## 2024-11-09 ENCOUNTER — Other Ambulatory Visit: Payer: Self-pay

## 2024-11-09 ENCOUNTER — Other Ambulatory Visit: Payer: Self-pay | Admitting: Family Medicine

## 2024-11-09 DIAGNOSIS — K449 Diaphragmatic hernia without obstruction or gangrene: Secondary | ICD-10-CM

## 2024-11-09 DIAGNOSIS — R112 Nausea with vomiting, unspecified: Secondary | ICD-10-CM

## 2024-11-09 DIAGNOSIS — R634 Abnormal weight loss: Secondary | ICD-10-CM

## 2024-11-11 ENCOUNTER — Other Ambulatory Visit: Payer: Self-pay

## 2024-11-11 MED FILL — Omeprazole Cap Delayed Release 40 MG: ORAL | 90 days supply | Qty: 180 | Fill #0 | Status: AC

## 2024-11-13 ENCOUNTER — Other Ambulatory Visit: Payer: Self-pay

## 2024-11-13 NOTE — Congregational Nurse Program (Signed)
" °  Dept: [phone]   Congregational Nurse Program Note  Date of Encounter: 11/13/2024 Client to [facility], nurse led clinic with request for foot care and medication refill, RN called the the refill to the [facility] pharmacy, RN to pick up for client. Foot care given, nail trimmed. Old healing blister noted to the lateral side of his right heel.  Liquid bandage applied. MARLA Marina BSN, RN Past Medical History: Past Medical History:  Diagnosis Date   Alcohol use disorder 12/11/2023   Asthma    Bipolar 1 disorder (HCC)    BPH (benign prostatic hyperplasia) 12/11/2023   Diabetes mellitus without complication (HCC)    controlled by weight loss   GERD (gastroesophageal reflux disease)    History of CVA with residual deficit    HIV infection (HCC)    Hyperlipidemia 08/22/2022   Migraine headache    2-3 X/month   Nephrolithiasis    PTSD (post-traumatic stress disorder) 12/11/2023   Wears dentures    full upper and lower    Encounter Details:  Community Questionnaire - 11/13/24 1329       Questionnaire   Ask client: Do you give verbal consent for me to treat you today? Yes    Student Assistance N/A    Location Patient Served  [facility]    Encounter Setting CN site    Population Status Unhoused    [place]    Insurance/Financial Assistance Referral N/A    Medication Patient Medications Reviewed;CN Made Medication Intervention (pickup etc)    Medical Provider Yes    Medical Referrals Made NA    Medical Appointment Completed N/A    Screenings CN Performed (remember to also record results) NA    CNP Interventions Advocate/Support;Case Management    ED Visit Averted N/A            "

## 2024-11-17 ENCOUNTER — Other Ambulatory Visit (HOSPITAL_COMMUNITY): Payer: Self-pay

## 2024-11-17 MED FILL — Potassium Citrate Tab ER 10 MEQ (1080 MG): ORAL | 90 days supply | Qty: 270 | Fill #1 | Status: CN

## 2024-11-20 ENCOUNTER — Other Ambulatory Visit (HOSPITAL_COMMUNITY): Payer: Self-pay

## 2024-11-21 ENCOUNTER — Other Ambulatory Visit: Payer: Self-pay

## 2024-11-21 ENCOUNTER — Ambulatory Visit: Admitting: Family Medicine

## 2024-11-22 ENCOUNTER — Other Ambulatory Visit (HOSPITAL_COMMUNITY): Payer: Self-pay

## 2024-11-25 ENCOUNTER — Other Ambulatory Visit: Payer: Self-pay

## 2024-11-25 ENCOUNTER — Other Ambulatory Visit (HOSPITAL_COMMUNITY): Payer: Self-pay

## 2024-11-25 MED FILL — Potassium Citrate Tab ER 10 MEQ (1080 MG): ORAL | 90 days supply | Qty: 270 | Fill #1 | Status: CN

## 2024-11-26 ENCOUNTER — Other Ambulatory Visit (HOSPITAL_COMMUNITY): Payer: Self-pay

## 2024-11-26 ENCOUNTER — Other Ambulatory Visit: Payer: Self-pay

## 2024-11-26 MED FILL — Potassium Citrate Tab ER 10 MEQ (1080 MG): ORAL | 34 days supply | Qty: 100 | Fill #1 | Status: AC

## 2024-11-27 ENCOUNTER — Other Ambulatory Visit: Payer: Self-pay

## 2024-11-28 ENCOUNTER — Other Ambulatory Visit (HOSPITAL_COMMUNITY): Payer: Self-pay
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
## 2024-11-28 NOTE — Congregational Nurse Program (Signed)
" °  Dept: [phone]   Congregational Nurse Program Note  Date of Encounter: 11/28/2024 [facility] to [facility] for first aide and to pick up medications. Client has a healing blister to the outer lower portion of his right index finger. Antibiotic ointment applied, covered with adhesive, waterproof bandage. MARLA Marina BSN, RN Past Medical History: Past Medical History:  Diagnosis Date   Alcohol use disorder 12/11/2023   Asthma    Bipolar 1 disorder (HCC)    BPH (benign prostatic hyperplasia) 12/11/2023   Diabetes mellitus without complication (HCC)    controlled by weight loss   GERD (gastroesophageal reflux disease)    History of CVA with residual deficit    HIV infection (HCC)    Hyperlipidemia 08/22/2022   Migraine headache    2-3 X/month   Nephrolithiasis    PTSD (post-traumatic stress disorder) 12/11/2023   Wears dentures    full upper and lower    Encounter Details:  Community Questionnaire - 11/28/24 1000       Questionnaire   Ask client: Do you give verbal consent for me to treat you today? Yes    Student Assistance N/A    Location Patient Served  [facility]    Encounter Setting CN site    Population Status Unhoused    [place]    Insurance/Financial Assistance Referral N/A    Medication Patient Medications Reviewed;CN Made Medication Intervention (pickup etc)    Medical Provider Yes    Medical Referrals Made NA    Medical Appointment Completed N/A    Screenings CN Performed (remember to also record results) NA    CN Interventions Advocate/Support;Case Management    ED Visit Averted N/A            "

## 2024-12-03 ENCOUNTER — Ambulatory Visit: Admitting: Psychiatry

## 2024-12-04 ENCOUNTER — Ambulatory Visit: Admitting: Infectious Disease

## 2024-12-11 ENCOUNTER — Ambulatory Visit: Admitting: Infectious Disease

## 2025-05-15 ENCOUNTER — Encounter: Admitting: Family Medicine
